# Patient Record
Sex: Male | Born: 1955 | Race: White | Hispanic: No | Marital: Married | Smoking: Former smoker
Health system: Southern US, Community
[De-identification: ages and names within clinical notes are randomized; demographics above are authoritative.]

## PROBLEM LIST (undated history)

## (undated) DIAGNOSIS — J849 Interstitial pulmonary disease, unspecified: Secondary | ICD-10-CM

## (undated) DIAGNOSIS — K5790 Diverticulosis of intestine, part unspecified, without perforation or abscess without bleeding: Secondary | ICD-10-CM

## (undated) DIAGNOSIS — A048 Other specified bacterial intestinal infections: Secondary | ICD-10-CM

## (undated) DIAGNOSIS — C439 Malignant melanoma of skin, unspecified: Secondary | ICD-10-CM

## (undated) DIAGNOSIS — Z86018 Personal history of other benign neoplasm: Secondary | ICD-10-CM

## (undated) DIAGNOSIS — C801 Malignant (primary) neoplasm, unspecified: Secondary | ICD-10-CM

## (undated) HISTORY — DX: Other specified bacterial intestinal infections: A04.8

## (undated) HISTORY — DX: Diverticulosis of intestine, part unspecified, without perforation or abscess without bleeding: K57.90

## (undated) HISTORY — DX: Malignant melanoma of skin, unspecified: C43.9

## (undated) HISTORY — PX: APPENDECTOMY: SHX54

## (undated) HISTORY — PX: KNEE SURGERY: SHX244

## (undated) HISTORY — DX: Malignant (primary) neoplasm, unspecified: C80.1

## (undated) HISTORY — DX: Interstitial pulmonary disease, unspecified: J84.9

---
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
## 2001-01-28 LAB — HM COLONOSCOPY

## 2002-09-14 ENCOUNTER — Encounter: Payer: Self-pay | Admitting: Family Medicine

## 2002-09-14 LAB — CONVERTED CEMR LAB: Blood Glucose, Fasting: 76 mg/dL

## 2004-10-11 ENCOUNTER — Ambulatory Visit: Payer: Self-pay | Admitting: Dermatology

## 2004-11-03 DIAGNOSIS — C801 Malignant (primary) neoplasm, unspecified: Secondary | ICD-10-CM

## 2004-11-03 HISTORY — PX: MELANOMA EXCISION: SHX5266

## 2004-11-03 HISTORY — DX: Malignant (primary) neoplasm, unspecified: C80.1

## 2005-11-19 ENCOUNTER — Ambulatory Visit: Payer: Self-pay | Admitting: Family Medicine

## 2005-11-19 LAB — CONVERTED CEMR LAB
Blood Glucose, Fasting: 99 mg/dL
TSH: 2.26 microintl units/mL

## 2006-03-06 ENCOUNTER — Ambulatory Visit: Payer: Self-pay | Admitting: Dermatology

## 2007-12-17 ENCOUNTER — Encounter: Payer: Self-pay | Admitting: Family Medicine

## 2007-12-17 DIAGNOSIS — E7800 Pure hypercholesterolemia, unspecified: Secondary | ICD-10-CM

## 2007-12-17 DIAGNOSIS — Z85820 Personal history of malignant melanoma of skin: Secondary | ICD-10-CM

## 2007-12-20 ENCOUNTER — Ambulatory Visit: Payer: Self-pay | Admitting: Family Medicine

## 2007-12-20 DIAGNOSIS — K5730 Diverticulosis of large intestine without perforation or abscess without bleeding: Secondary | ICD-10-CM | POA: Insufficient documentation

## 2007-12-20 LAB — CONVERTED CEMR LAB
AST: 24 units/L (ref 0–37)
Albumin: 4 g/dL (ref 3.5–5.2)
Basophils Absolute: 0 10*3/uL (ref 0.0–0.1)
Basophils Relative: 0.1 % (ref 0.0–1.0)
CO2: 30 meq/L (ref 19–32)
Creatinine, Ser: 1 mg/dL (ref 0.4–1.5)
Direct LDL: 152.6 mg/dL
Eosinophils Relative: 2.1 % (ref 0.0–5.0)
GFR calc Af Amer: 101 mL/min
Glucose, Bld: 91 mg/dL (ref 70–99)
HCT: 45.3 % (ref 39.0–52.0)
Hemoglobin: 15.2 g/dL (ref 13.0–17.0)
MCHC: 33.5 g/dL (ref 30.0–36.0)
MCV: 91.4 fL (ref 78.0–100.0)
Monocytes Absolute: 0.5 10*3/uL (ref 0.2–0.7)
Monocytes Relative: 8 % (ref 3.0–11.0)
Neutro Abs: 3.6 10*3/uL (ref 1.4–7.7)
Neutrophils Relative %: 61.1 % (ref 43.0–77.0)
PSA: 3 ng/mL (ref 0.10–4.00)
Potassium: 4.7 meq/L (ref 3.5–5.1)
RDW: 12.2 % (ref 11.5–14.6)
Sodium: 142 meq/L (ref 135–145)
Total Bilirubin: 0.7 mg/dL (ref 0.3–1.2)
Total CHOL/HDL Ratio: 6
Total Protein: 7 g/dL (ref 6.0–8.3)
VLDL: 28 mg/dL (ref 0–40)
WBC: 5.9 10*3/uL (ref 4.5–10.5)

## 2007-12-24 ENCOUNTER — Ambulatory Visit: Payer: Self-pay | Admitting: Family Medicine

## 2007-12-24 DIAGNOSIS — R030 Elevated blood-pressure reading, without diagnosis of hypertension: Secondary | ICD-10-CM

## 2008-02-01 ENCOUNTER — Ambulatory Visit: Payer: Self-pay | Admitting: Family Medicine

## 2008-02-01 LAB — CONVERTED CEMR LAB: OCCULT 3: NEGATIVE

## 2008-02-02 ENCOUNTER — Encounter (INDEPENDENT_AMBULATORY_CARE_PROVIDER_SITE_OTHER): Payer: Self-pay | Admitting: *Deleted

## 2009-02-19 ENCOUNTER — Ambulatory Visit: Payer: Self-pay | Admitting: Family Medicine

## 2009-02-19 LAB — CONVERTED CEMR LAB
ALT: 22 units/L (ref 0–53)
AST: 25 units/L (ref 0–37)
Alkaline Phosphatase: 70 units/L (ref 39–117)
BUN: 15 mg/dL (ref 6–23)
Basophils Relative: 0 % (ref 0.0–3.0)
Bilirubin, Direct: 0 mg/dL (ref 0.0–0.3)
CO2: 28 meq/L (ref 19–32)
Chloride: 107 meq/L (ref 96–112)
Creatinine, Ser: 1 mg/dL (ref 0.4–1.5)
Eosinophils Relative: 2.5 % (ref 0.0–5.0)
GFR calc non Af Amer: 83.34 mL/min (ref >60)
HCT: 43.6 % (ref 39.0–52.0)
HDL: 38.6 mg/dL — ABNORMAL LOW (ref >39.00)
Hemoglobin: 14.9 g/dL (ref 13.0–17.0)
Lymphs Abs: 1.6 10*3/uL (ref 0.7–4.0)
MCV: 93 fL (ref 78.0–100.0)
Monocytes Absolute: 0.5 10*3/uL (ref 0.1–1.0)
Monocytes Relative: 9.1 % (ref 3.0–12.0)
Neutro Abs: 2.9 10*3/uL (ref 1.4–7.7)
Neutrophils Relative %: 57.4 % (ref 43.0–77.0)
Platelets: 223 10*3/uL (ref 150.0–400.0)
Potassium: 4.5 meq/L (ref 3.5–5.1)
TSH: 3.34 microintl units/mL (ref 0.35–5.50)
Total CHOL/HDL Ratio: 6
Total Protein: 6.9 g/dL (ref 6.0–8.3)
Triglycerides: 143 mg/dL (ref 0.0–149.0)
WBC: 5.1 10*3/uL (ref 4.5–10.5)

## 2009-02-21 ENCOUNTER — Ambulatory Visit: Payer: Self-pay | Admitting: Family Medicine

## 2009-02-21 DIAGNOSIS — L84 Corns and callosities: Secondary | ICD-10-CM | POA: Insufficient documentation

## 2009-02-21 DIAGNOSIS — R1310 Dysphagia, unspecified: Secondary | ICD-10-CM

## 2009-03-14 ENCOUNTER — Ambulatory Visit: Payer: Self-pay | Admitting: Family Medicine

## 2009-03-14 ENCOUNTER — Encounter (INDEPENDENT_AMBULATORY_CARE_PROVIDER_SITE_OTHER): Payer: Self-pay | Admitting: *Deleted

## 2009-03-14 LAB — CONVERTED CEMR LAB
OCCULT 1: NEGATIVE
OCCULT 2: NEGATIVE
OCCULT 3: NEGATIVE

## 2010-02-21 ENCOUNTER — Ambulatory Visit: Payer: Self-pay | Admitting: Family Medicine

## 2010-02-21 LAB — CONVERTED CEMR LAB
Albumin: 4 g/dL (ref 3.5–5.2)
Alkaline Phosphatase: 62 units/L (ref 39–117)
BUN: 12 mg/dL (ref 6–23)
Basophils Absolute: 0 10*3/uL (ref 0.0–0.1)
Bilirubin, Direct: 0.1 mg/dL (ref 0.0–0.3)
CO2: 28 meq/L (ref 19–32)
Calcium: 9.2 mg/dL (ref 8.4–10.5)
Cholesterol: 201 mg/dL — ABNORMAL HIGH (ref 0–200)
Creatinine, Ser: 1 mg/dL (ref 0.4–1.5)
Direct LDL: 128.4 mg/dL
Eosinophils Relative: 4.7 % (ref 0.0–5.0)
GFR calc non Af Amer: 83.02 mL/min (ref >60)
Glucose, Bld: 84 mg/dL (ref 70–99)
HCT: 43.4 % (ref 39.0–52.0)
HDL: 36.1 mg/dL — ABNORMAL LOW (ref >39.00)
Hemoglobin: 14.8 g/dL (ref 13.0–17.0)
Lymphocytes Relative: 24.2 % (ref 12.0–46.0)
Monocytes Relative: 12.8 % — ABNORMAL HIGH (ref 3.0–12.0)
Neutro Abs: 2.9 10*3/uL (ref 1.4–7.7)
Platelets: 201 10*3/uL (ref 150.0–400.0)
RBC: 4.7 M/uL (ref 4.22–5.81)
RDW: 13.7 % (ref 11.5–14.6)
Sodium: 139 meq/L (ref 135–145)
TSH: 2.6 microintl units/mL (ref 0.35–5.50)
Total Protein: 6.6 g/dL (ref 6.0–8.3)
Triglycerides: 177 mg/dL — ABNORMAL HIGH (ref 0.0–149.0)
WBC: 5 10*3/uL (ref 4.5–10.5)

## 2010-02-22 LAB — CONVERTED CEMR LAB: Vit D, 25-Hydroxy: 37 ng/mL (ref 30–89)

## 2010-02-26 ENCOUNTER — Ambulatory Visit: Payer: Self-pay | Admitting: Family Medicine

## 2010-03-27 ENCOUNTER — Ambulatory Visit: Payer: Self-pay | Admitting: Family Medicine

## 2010-03-27 ENCOUNTER — Encounter (INDEPENDENT_AMBULATORY_CARE_PROVIDER_SITE_OTHER): Payer: Self-pay | Admitting: *Deleted

## 2010-03-27 LAB — CONVERTED CEMR LAB
OCCULT 1: NEGATIVE
OCCULT 2: NEGATIVE

## 2010-03-27 LAB — FECAL OCCULT BLOOD, GUAIAC: Fecal Occult Blood: NEGATIVE

## 2010-06-11 ENCOUNTER — Encounter (INDEPENDENT_AMBULATORY_CARE_PROVIDER_SITE_OTHER): Payer: Self-pay | Admitting: *Deleted

## 2010-06-27 DIAGNOSIS — Z86018 Personal history of other benign neoplasm: Secondary | ICD-10-CM

## 2010-06-27 HISTORY — DX: Personal history of other benign neoplasm: Z86.018

## 2010-12-03 NOTE — Letter (Signed)
Summary: Results Follow up Letter  [place] [facility]  [address] [place], [place] [address]   Phone: [phone]  Fax: [phone]    03/27/2010 MRN: [id number]  Charles Collins [address] [place], [place]  [address]  Dear [name],  The following are the results of your recent test(s):  Test         Result    Pap Smear:        Normal _____  Not Normal _____ Comments: ______________________________________________________ Cholesterol: LDL(Bad cholesterol):         Your goal is less than:         HDL (Good cholesterol):       Your goal is more than: Comments:  ______________________________________________________ Mammogram:        Normal _____  Not Normal _____ Comments:  ___________________________________________________________________ Hemoccult:        Normal _X____  Not normal _______ Comments:  Please repeat in one year.  _____________________________________________________________________ Other Tests:    We routinely do not discuss normal results over the telephone.  If you desire a copy of the results, or you have any questions about this information we can discuss them at your next office visit.   Sincerely,     Laurita Quint, MD

## 2010-12-03 NOTE — Assessment & Plan Note (Signed)
Summary: CPX/CLE   Vital Signs:  Patient profile:   55 year old male Height:      68 inches Weight:      181.25 pounds BMI:     27.66 Temp:     98.6 degrees F oral Pulse rate:   72 / minute Pulse rhythm:   regular BP sitting:   128 / 86  (left arm) Cuff size:   regular  Vitals Entered By: Sydell Axon LPN (February 26, 2010 8:17 AM) CC: 30 Minute checkup, hemoccult cards given to patient   History of Present Illness: Pt hewre for Comp Exam. He has done well with the swallowing by drinking more.  He has no complaints today except allergies which we discussed.  Preventive Screening-Counseling & Management  Alcohol-Tobacco     Alcohol drinks/day: 0     Smoking Status: never     Passive Smoke Exposure: no  Caffeine-Diet-Exercise     Caffeine use/day: 1     Does Patient Exercise: yes     Type of exercise: treadmill     Exercise (avg: min/session):     Times/week: 3  Problems Prior to Update: 1)  Callus, Right Foot  (ICD-700) 2)  Problems With Swallowing and Mastication  (ICD-V41.6) 3)  Special Screening Malig Neoplasms Other Sites  (ICD-V76.49) 4)  Health Maintenance Exam  (ICD-V70.0) 5)  Special Screening Malignant Neoplasm of Prostate  (ICD-V76.44) 6)  Diverticular Disease  (ICD-562.10) 7)  Melanoma, Hx of  (ICD-V10.82) 8)  Elevated Blood Pressure  (ICD-796.2) 9)  Hypercholesterolemia  (ICD-272.0) 10)  Diverticulitis of Colon  (ICD-562.11)  Medications Prior to Update: 1)  Bayer Aspirin Ec Low Dose 81 Mg  Tbec (Aspirin) .Marland Kitchen.. 1 Daily  Allergies: No Known Drug Allergies  Past History:  Past Surgical History: Last updated: 02/21/2009 APPENDECTOMY  AS TEEN R KNEE OPEN REPAIR AS TEEN [facility]--COLITIS:(01/28/2001) DIVERTICULOSIS-- DESCENDING AND SIGMOID COLON:(03/2001) DIVERTICULOSIS ISCHEMIC COLITIS VIA COLONOSCOPY(DR. SEIGAL):(01/2001) MELANOMA REMOVED RIGHT KNEE (DR Gwen Pounds) 11/2004  Family History: Last updated: 02/26/2010 Father:ALIVE 79;HTN;  DM;OBESITY  Mother: DECEASED AT AGE 66 BRAIN HEMMORHAGE BROTHER A 47 1/2 BROTHER A 40 CV: NEGATIVE HBP: +FATHER; +PGF DM: + FATHER GOUT/ARTHRITIS: PROSTATE CANCER: BREAST/OVARIAN/UTERINE CANCER:NEGATIVE COLON CANCER: NEGATIVE DEPRESSION: NEGATIVE ETOH/DRUG ABUSE: NEGATIVE OTHER: NEGATIVE  STROKE           // MOTHER BRAIN HEMMORHAGE  Social History: Last updated: 12/17/2007 Marital Status: Married LIVES WITH WIFE Children: 2 DAUGHTERS 1 AT HOME Occupation: ELECTRICIAN //HEATING AND AIR CONDITION  Risk Factors: Alcohol Use: 0 (02/26/2010) Caffeine Use: 1 (02/26/2010) Exercise: yes (02/26/2010)  Risk Factors: Smoking Status: never (02/26/2010) Passive Smoke Exposure: no (02/26/2010)  Family History: Father:ALIVE 79;HTN; DM;OBESITY  Mother: DECEASED AT AGE 66 BRAIN HEMMORHAGE BROTHER A 47 1/2 BROTHER A 40 CV: NEGATIVE HBP: +FATHER; +PGF DM: + FATHER GOUT/ARTHRITIS: PROSTATE CANCER: BREAST/OVARIAN/UTERINE CANCER:NEGATIVE COLON CANCER: NEGATIVE DEPRESSION: NEGATIVE ETOH/DRUG ABUSE: NEGATIVE OTHER: NEGATIVE  STROKE           // MOTHER BRAIN HEMMORHAGE  Review of Systems General:  Denies chills, fatigue, fever, sweats, weakness, and weight loss. Eyes:  Denies blurring, discharge, and eye pain. ENT:  Denies decreased hearing, ear discharge, earache, and ringing in ears. CV:  Denies chest pain or discomfort, fainting, fatigue, palpitations, shortness of breath with exertion, swelling of feet, and swelling of hands. Resp:  Denies cough, shortness of breath, and wheezing; Rare wheezing with Spring allergies. GI:  Denies abdominal pain, bloody stools, change in bowel habits, constipation, dark tarry stools,  diarrhea, indigestion, loss of appetite, nausea, vomiting, vomiting blood, and yellowish skin color. GU:  Complains of nocturia; denies discharge, dysuria, and urinary frequency; occas. MS:  Denies joint pain, low back pain, muscle aches, cramps, and stiffness. Derm:   Denies dryness, itching, and rash. Neuro:  Denies numbness, poor balance, tingling, and tremors.  Physical Exam  General:  Well-developed,well-nourished,in no acute distress; alert,appropriate and cooperative throughout examination Head:  Normocephalic and atraumatic without obvious abnormalities. No apparent alopecia or balding. Sinuses NT. Eyes:  Conjunctiva clear bilaterally.  Ears:  External ear exam shows no significant lesions or deformities.  Otoscopic examination reveals clear canals, tympanic membranes are intact bilaterally without bulging, retraction, inflammation or discharge. Hearing is grossly normal bilaterally. Nose:  External nasal examination shows no deformity or inflammation. Nasal mucosa are pink and moist without lesions or exudates. Mouth:  Oral mucosa and oropharynx without lesions or exudates.  Teeth in good repair. Neck:  No deformities, masses, or tenderness noted. Chest Wall:  No deformities, masses, tenderness or gynecomastia noted. Breasts:  No masses or gynecomastia noted Lungs:  Normal respiratory effort, chest expands symmetrically. Lungs are clear to auscultation, no crackles or wheezes. Heart:  Normal rate and regular rhythm. S1 and S2 normal without gallop, murmur, click, rub or other extra sounds. Abdomen:  Bowel sounds positive,abdomen soft and non-tender without masses, organomegaly or hernias noted. Rectal:  No external abnormalities noted. Normal sphincter tone. No rectal masses or tenderness. G neg. Genitalia:  Testes bilaterally descended without nodularity, tenderness or masses. No scrotal masses or lesions. No penis lesions or urethral discharge. Prostate:  Prostate gland firm and smooth, no enlargement, nodularity, tenderness, mass, asymmetry or induration. 10gms. Msk:  No deformity or scoliosis noted of thoracic or lumbar spine.   Pulses:  R and L carotid,radial,femoral,dorsalis pedis and posterior tibial pulses are full and equal  bilaterally Extremities:  No clubbing, cyanosis, edema, or deformity noted with normal full range of motion of all joints.  R 5th plantar surf metatarsal head calloused moderately, pared effectively.. Neurologic:  No cranial nerve deficits noted. Station and gait are normal. Plantar reflexes are down-going bilaterally. DTRs are symmetrical throughout. Sensory, motor and coordinative functions appear intact. Skin:  Intact without suspicious lesions or rashes Cervical Nodes:  No lymphadenopathy noted Inguinal Nodes:  No significant adenopathy Psych:  Cognition and judgment appear intact. Alert and cooperative with normal attention span and concentration. No apparent delusions, illusions, hallucinations   Impression & Recommendations:  Problem # 1:  HEALTH MAINTENANCE EXAM (ICD-V70.0) Assessment Comment Only  Reviewed preventive care protocols, scheduled due services, and updated immunizations.  Problem # 2:  DIVERTICULAR DISEASE (ICD-562.10) Assessment: Unchanged  Discussed avoiding nuts, eating fiber and coming in for prolonged LLQ discomfort.  Colonoscopy:  Labs Reviewed: Hgb: 14.8 (02/21/2010)   Hct: 43.4 (02/21/2010)   WBC: 5.0 (02/21/2010)  Problem # 3:  PROBLEMS WITH SWALLOWING AND MASTICATION (ICD-V41.6) Assessment: Improved Hasmbeen more careful and things improved.  Problem # 4:  SPECIAL SCREENING MALIGNANT NEOPLASM OF PROSTATE (ICD-V76.44) Assessment: Unchanged Stable PSA and exam.  Problem # 5:  MELANOMA, HX OF (ICD-V10.82) Assessment: Unchanged Followed regularly with Dr Gwen Pounds.  Problem # 6:  HYPERCHOLESTEROLEMIA (ICD-272.0) Assessment: Unchanged  Adequate but discussed lowering LDL and Trigs and incr HDL.  Labs Reviewed: SGOT: 23 (02/21/2010)   SGPT: 21 (02/21/2010)   HDL:36.10 (02/21/2010), 38.60 (02/19/2009)  LDL:DEL (12/20/2007)  Chol:201 (02/21/2010), 220 (02/19/2009)  Trig:177.0 (02/21/2010), 143.0 (02/19/2009)  Complete Medication List: 1)  Bayer  Aspirin Ec Low  Dose 81 Mg Tbec (Aspirin) .Marland Kitchen.. 1 daily  Patient Instructions: 1)  RTC one year for Comp Exam, sooner as needed.  Current Allergies (reviewed today): No known allergies

## 2010-12-03 NOTE — Letter (Signed)
Summary: Nadara Eaton letter  [place] [facility]  [address] [place], [place] [address]   Phone: [phone]  Fax: [phone]       06/11/2010 MRN: [id number]  FRANSISCO MESSMER [address] [place], [place]  [address]  Dear Mr. Renee [facility] announce the retirement of Arta Silence, M.D., from full-time practice at the [facility] office effective May 02, 2010 and his plans of returning part-time.  It is important to Dr. Hetty Ely and to our practice that you understand that [facility] has seven physicians in our office for your health care needs.  We will continue to offer the same exceptional care that you have today.    Dr. Hetty Ely has spoken to many of you about his plans for retirement and returning part-time in the fall.   We will continue to work with you through the transition to schedule appointments for you in the office and meet the high standards that [facility] is committed to.   Again, it is with great pleasure that we share the news that Dr. Hetty Ely will return to [facility] in October of 2011 with a reduced schedule.    If you have any questions, or would like to request an appointment with one of our physicians, please call us at [phone] and press the option for Scheduling an appointment.  We take pleasure in providing you with excellent patient care and look forward to seeing you at your next office visit.  Our [facility] Physicians are:  Tillman Abide, M.D. Laurita Quint, M.D. Roxy Manns, M.D. Kerby Nora, M.D. Hannah Beat, M.D. Ruthe Mannan, M.D. We proudly welcomed Raechel Ache, M.D. and Eustaquio Boyden, M.D. to the practice in July/August 2011.  Sincerely,  [facility]

## 2011-01-12 ENCOUNTER — Encounter: Payer: Self-pay | Admitting: Family Medicine

## 2011-01-15 ENCOUNTER — Encounter: Payer: Self-pay | Admitting: Family Medicine

## 2011-02-17 ENCOUNTER — Other Ambulatory Visit: Payer: Self-pay | Admitting: Family Medicine

## 2011-02-17 DIAGNOSIS — K5730 Diverticulosis of large intestine without perforation or abscess without bleeding: Secondary | ICD-10-CM

## 2011-02-17 DIAGNOSIS — Z125 Encounter for screening for malignant neoplasm of prostate: Secondary | ICD-10-CM

## 2011-02-17 DIAGNOSIS — E7800 Pure hypercholesterolemia, unspecified: Secondary | ICD-10-CM

## 2011-02-17 DIAGNOSIS — Z85820 Personal history of malignant melanoma of skin: Secondary | ICD-10-CM

## 2011-02-17 DIAGNOSIS — R1310 Dysphagia, unspecified: Secondary | ICD-10-CM

## 2011-02-17 DIAGNOSIS — R030 Elevated blood-pressure reading, without diagnosis of hypertension: Secondary | ICD-10-CM

## 2011-03-03 ENCOUNTER — Other Ambulatory Visit (INDEPENDENT_AMBULATORY_CARE_PROVIDER_SITE_OTHER): Payer: PRIVATE HEALTH INSURANCE

## 2011-03-03 DIAGNOSIS — R030 Elevated blood-pressure reading, without diagnosis of hypertension: Secondary | ICD-10-CM

## 2011-03-03 DIAGNOSIS — R1310 Dysphagia, unspecified: Secondary | ICD-10-CM

## 2011-03-03 DIAGNOSIS — Z85820 Personal history of malignant melanoma of skin: Secondary | ICD-10-CM

## 2011-03-03 DIAGNOSIS — Z125 Encounter for screening for malignant neoplasm of prostate: Secondary | ICD-10-CM

## 2011-03-03 DIAGNOSIS — E7800 Pure hypercholesterolemia, unspecified: Secondary | ICD-10-CM

## 2011-03-03 LAB — CBC WITH DIFFERENTIAL/PLATELET
Basophils Absolute: 0 10*3/uL (ref 0.0–0.1)
Basophils Relative: 0.5 % (ref 0.0–3.0)
Eosinophils Absolute: 0.1 10*3/uL (ref 0.0–0.7)
Hemoglobin: 14.6 g/dL (ref 13.0–17.0)
MCHC: 34.6 g/dL (ref 30.0–36.0)
MCV: 93.1 fl (ref 78.0–100.0)
Monocytes Absolute: 0.4 10*3/uL (ref 0.1–1.0)
Neutro Abs: 2.9 10*3/uL (ref 1.4–7.7)
RBC: 4.55 Mil/uL (ref 4.22–5.81)
RDW: 13 % (ref 11.5–14.6)

## 2011-03-03 LAB — HEPATIC FUNCTION PANEL
ALT: 21 U/L (ref 0–53)
Albumin: 3.7 g/dL (ref 3.5–5.2)
Bilirubin, Direct: 0 mg/dL (ref 0.0–0.3)
Total Bilirubin: 0.5 mg/dL (ref 0.3–1.2)
Total Protein: 6.4 g/dL (ref 6.0–8.3)

## 2011-03-03 LAB — BASIC METABOLIC PANEL
CO2: 29 mEq/L (ref 19–32)
Calcium: 8.9 mg/dL (ref 8.4–10.5)
Chloride: 102 mEq/L (ref 96–112)
Creatinine, Ser: 0.8 mg/dL (ref 0.4–1.5)
Glucose, Bld: 90 mg/dL (ref 70–99)

## 2011-03-03 LAB — LIPID PANEL
HDL: 41.7 mg/dL (ref >39.00)
Total CHOL/HDL Ratio: 5
Triglycerides: 200 mg/dL — ABNORMAL HIGH (ref 0.0–149.0)

## 2011-03-03 LAB — VITAMIN B12: Vitamin B-12: 245 pg/mL (ref 211–911)

## 2011-03-12 ENCOUNTER — Ambulatory Visit (INDEPENDENT_AMBULATORY_CARE_PROVIDER_SITE_OTHER): Payer: PRIVATE HEALTH INSURANCE | Admitting: Family Medicine

## 2011-03-12 ENCOUNTER — Encounter: Payer: Self-pay | Admitting: Family Medicine

## 2011-03-12 DIAGNOSIS — Z85820 Personal history of malignant melanoma of skin: Secondary | ICD-10-CM

## 2011-03-12 DIAGNOSIS — K5730 Diverticulosis of large intestine without perforation or abscess without bleeding: Secondary | ICD-10-CM

## 2011-03-12 DIAGNOSIS — M722 Plantar fascial fibromatosis: Secondary | ICD-10-CM

## 2011-03-12 DIAGNOSIS — R1310 Dysphagia, unspecified: Secondary | ICD-10-CM

## 2011-03-12 DIAGNOSIS — E7800 Pure hypercholesterolemia, unspecified: Secondary | ICD-10-CM

## 2011-03-12 DIAGNOSIS — R030 Elevated blood-pressure reading, without diagnosis of hypertension: Secondary | ICD-10-CM

## 2011-03-12 NOTE — Assessment & Plan Note (Signed)
Normal today. BP Readings from Last 3 Encounters:  03/12/11 112/72  02/26/10 128/86  02/21/09 130/80

## 2011-03-12 NOTE — Patient Instructions (Signed)
RTC one year fpr recheck, Td UTD. Given iFOB.

## 2011-03-12 NOTE — Assessment & Plan Note (Signed)
Discussed returning for prolonged LLQ discomfort.

## 2011-03-12 NOTE — Progress Notes (Signed)
  Subjective:    Patient ID: Charles Collins, male    DOB: Mar 18, 1956, 55 y.o.   MRN: [id number]  HPI Pt here for Comp Exam. He feels well and has no complaints. He has had plantar fasciitis.Marland KitchenMarland Kitchenput on Meloxicam and insoles and then given Meloxicam. He has taken the Meloxicam 3 months now. He has one more refill for a month left and then we discussed avoiding as able. He is doing stretching.   Review of Systems  Constitutional: Negative for fever, chills, diaphoresis, appetite change, fatigue and unexpected weight change.  HENT: Negative for hearing loss, ear pain, tinnitus and ear discharge.   Eyes: Negative for pain, discharge and visual disturbance.  Respiratory: Negative for cough, shortness of breath and wheezing.   Cardiovascular: Negative for chest pain and palpitations.       No SOB w/ exertion  Gastrointestinal: Negative for nausea, vomiting, abdominal pain, diarrhea, constipation and blood in stool.       No heartburn or swallowing problems.  Genitourinary: Negative for dysuria, frequency and difficulty urinating.       No nocturia  Musculoskeletal: Negative for myalgias, back pain and arthralgias.       Foot pain per HPI.  Skin: Negative for rash.       No itching or dryness.  Neurological: Negative for tremors and numbness.       No tingling or balance problems.  Hematological: Negative for adenopathy. Does not bruise/bleed easily.  Psychiatric/Behavioral: Negative for dysphoric mood and agitation.       Objective:   Physical Exam  Constitutional: He is oriented to person, place, and time. He appears well-developed and well-nourished. No distress.  HENT:  Head: Normocephalic and atraumatic.  Right Ear: External ear normal.  Left Ear: External ear normal.  Nose: Nose normal.  Mouth/Throat: Oropharynx is clear and moist.  Eyes: Conjunctivae and EOM are normal. Pupils are equal, round, and reactive to light. Right eye exhibits no discharge. Left eye exhibits no  discharge. No scleral icterus.  Neck: Normal range of motion. Neck supple. No thyromegaly present.  Cardiovascular: Normal rate, regular rhythm, normal heart sounds and intact distal pulses.   No murmur heard. Pulmonary/Chest: Effort normal and breath sounds normal. No respiratory distress. He has no wheezes.  Abdominal: Soft. Bowel sounds are normal. He exhibits no distension and no mass. There is no tenderness. There is no rebound and no guarding.  Genitourinary: Rectum normal, prostate normal and penis normal. Guaiac negative stool.       Prostate 10-20gms, smooth and firm.  Musculoskeletal: Normal range of motion. He exhibits no edema.  Lymphadenopathy:    He has no cervical adenopathy.  Neurological: He is alert and oriented to person, place, and time. Coordination normal.  Skin: Skin is warm and dry. No rash noted. He is not diaphoretic.  Psychiatric: He has a normal mood and affect. His behavior is normal. Judgment and thought content normal.          Assessment & Plan:  HMPE

## 2011-03-12 NOTE — Assessment & Plan Note (Signed)
Per podiatry, finish another month of Meloxicam and then stop med but cont stretches, inserts and soaks.

## 2011-03-12 NOTE — Assessment & Plan Note (Signed)
No problems lately. Chews carefully.

## 2011-03-12 NOTE — Assessment & Plan Note (Addendum)
Tirgs slightly elevated. LDL very mildly elevated for him. Discussed diet and exercise. Lab Results  Component Value Date   CHOL 219* 03/03/2011   CHOL 201* 02/21/2010   CHOL 220* 02/19/2009   Lab Results  Component Value Date   HDL 41.70 03/03/2011   HDL 16.10* 02/21/2010   HDL 38.60* 02/19/2009   No results found for this basename: LDLCALC   Lab Results  Component Value Date   TRIG 200.0* 03/03/2011   TRIG 177.0* 02/21/2010   TRIG 143.0 02/19/2009   Lab Results  Component Value Date   CHOLHDL 5 03/03/2011   CHOLHDL 6 02/21/2010   CHOLHDL 6 02/19/2009   Lab Results  Component Value Date   LDLDIRECT 136.1 03/03/2011   LDLDIRECT 128.4 02/21/2010   LDLDIRECT 141.5 02/19/2009

## 2011-03-12 NOTE — Assessment & Plan Note (Signed)
Continues to see Dr Gwen Pounds.

## 2011-03-26 ENCOUNTER — Other Ambulatory Visit: Payer: PRIVATE HEALTH INSURANCE

## 2011-03-26 ENCOUNTER — Other Ambulatory Visit (INDEPENDENT_AMBULATORY_CARE_PROVIDER_SITE_OTHER): Payer: PRIVATE HEALTH INSURANCE | Admitting: Family Medicine

## 2011-03-26 DIAGNOSIS — Z1211 Encounter for screening for malignant neoplasm of colon: Secondary | ICD-10-CM

## 2011-03-26 LAB — FECAL OCCULT BLOOD, IMMUNOCHEMICAL: Fecal Occult Bld: NEGATIVE

## 2011-03-27 ENCOUNTER — Encounter: Payer: Self-pay | Admitting: *Deleted

## 2012-03-10 ENCOUNTER — Other Ambulatory Visit: Payer: Self-pay | Admitting: Family Medicine

## 2012-03-10 DIAGNOSIS — E7800 Pure hypercholesterolemia, unspecified: Secondary | ICD-10-CM

## 2012-03-10 DIAGNOSIS — Z125 Encounter for screening for malignant neoplasm of prostate: Secondary | ICD-10-CM

## 2012-03-15 ENCOUNTER — Other Ambulatory Visit (INDEPENDENT_AMBULATORY_CARE_PROVIDER_SITE_OTHER): Payer: BC Managed Care – PPO

## 2012-03-15 DIAGNOSIS — Z125 Encounter for screening for malignant neoplasm of prostate: Secondary | ICD-10-CM

## 2012-03-15 DIAGNOSIS — E7800 Pure hypercholesterolemia, unspecified: Secondary | ICD-10-CM

## 2012-03-15 DIAGNOSIS — E785 Hyperlipidemia, unspecified: Secondary | ICD-10-CM

## 2012-03-15 LAB — LIPID PANEL
Cholesterol: 211 mg/dL — ABNORMAL HIGH (ref 0–200)
HDL: 40.2 mg/dL (ref >39.00)
Total CHOL/HDL Ratio: 5
Triglycerides: 201 mg/dL — ABNORMAL HIGH (ref 0.0–149.0)

## 2012-03-15 LAB — COMPREHENSIVE METABOLIC PANEL
ALT: 21 U/L (ref 0–53)
Alkaline Phosphatase: 62 U/L (ref 39–117)
Chloride: 102 mEq/L (ref 96–112)
Creatinine, Ser: 0.9 mg/dL (ref 0.4–1.5)
GFR: 95.47 mL/min (ref >60.00)
Glucose, Bld: 90 mg/dL (ref 70–99)
Sodium: 139 mEq/L (ref 135–145)
Total Bilirubin: 0.4 mg/dL (ref 0.3–1.2)
Total Protein: 6.4 g/dL (ref 6.0–8.3)

## 2012-03-15 LAB — PSA: PSA: 0.9 ng/mL (ref 0.10–4.00)

## 2012-03-15 LAB — LDL CHOLESTEROL, DIRECT: Direct LDL: 131.4 mg/dL

## 2012-03-18 ENCOUNTER — Encounter: Payer: Self-pay | Admitting: Family Medicine

## 2012-03-18 ENCOUNTER — Ambulatory Visit (INDEPENDENT_AMBULATORY_CARE_PROVIDER_SITE_OTHER): Payer: BC Managed Care – PPO | Admitting: Family Medicine

## 2012-03-18 ENCOUNTER — Encounter: Payer: PRIVATE HEALTH INSURANCE | Admitting: Family Medicine

## 2012-03-18 VITALS — BP 116/82 | HR 71 | Temp 98.3°F | Wt 185.0 lb

## 2012-03-18 DIAGNOSIS — Z23 Encounter for immunization: Secondary | ICD-10-CM

## 2012-03-18 DIAGNOSIS — Z0000 Encounter for general adult medical examination without abnormal findings: Secondary | ICD-10-CM | POA: Insufficient documentation

## 2012-03-18 DIAGNOSIS — Z1211 Encounter for screening for malignant neoplasm of colon: Secondary | ICD-10-CM

## 2012-03-18 NOTE — Patient Instructions (Addendum)
I would get a flu shot each fall.   Go to the lab on the way out.  We'll contact you with your lab report. Keep exercising.  Recheck in 1 year.   Glad to see you.

## 2012-03-18 NOTE — Assessment & Plan Note (Signed)
Routine anticipatory guidance given to patient.  See health maintenance. Exercise mainly at work Diet discussed, he's working on low salt diet.  Colonoscopy 2002. D/w patient [id number] for colon cancer screening, including IFOB vs. colonoscopy.  Risks and benefits of both were discussed and patient voiced understanding.  Pt elects for: IFOB.   PSA options were discussed along with recent recs. Patient is low risk and there is no FH of prosate CA.  PSA wnl. Td 2003.  Flu shot discussed.   Advance directive.  He has a living will.  Wife is HCPOA.

## 2012-03-18 NOTE — Progress Notes (Signed)
CPE- See plan.  Routine anticipatory guidance given to patient.  See health maintenance. Exercise mainly at work Diet discussed, he's working on low salt diet.  Colonoscopy 2002. D/w patient [id number] for colon cancer screening, including IFOB vs. colonoscopy.  Risks and benefits of both were discussed and patient voiced understanding.  Pt elects for: IFOB.   PSA options were discussed along with recent recs. Patient is low risk and there is no FH of prosate CA.  PSA wnl. Td 2003.  Flu shot discussed.   Advance directive.  He has a living will.  Wife is HCPOA.    PMH and SH reviewed  Meds, vitals, and allergies reviewed.   ROS: See HPI.  Otherwise negative.    GEN: nad, alert and oriented HEENT: mucous membranes moist NECK: supple w/o LA CV: rrr. PULM: ctab, no inc wob ABD: soft, +bs EXT: no edema SKIN: no acute rash, scar on R knee noted Prostate gland firm and smooth, no enlargement, nodularity, tenderness, mass, asymmetry or induration.

## 2012-04-06 ENCOUNTER — Other Ambulatory Visit: Payer: BC Managed Care – PPO

## 2012-04-06 ENCOUNTER — Encounter: Payer: Self-pay | Admitting: *Deleted

## 2012-04-06 DIAGNOSIS — Z1211 Encounter for screening for malignant neoplasm of colon: Secondary | ICD-10-CM

## 2012-04-06 LAB — FECAL OCCULT BLOOD, IMMUNOCHEMICAL: Fecal Occult Bld: NEGATIVE

## 2013-01-05 ENCOUNTER — Encounter: Payer: Self-pay | Admitting: Family Medicine

## 2013-01-05 ENCOUNTER — Ambulatory Visit (INDEPENDENT_AMBULATORY_CARE_PROVIDER_SITE_OTHER): Payer: BC Managed Care – PPO | Admitting: Family Medicine

## 2013-01-05 VITALS — BP 124/84 | HR 78 | Temp 99.4°F | Wt 188.8 lb

## 2013-01-05 DIAGNOSIS — R1310 Dysphagia, unspecified: Secondary | ICD-10-CM

## 2013-01-05 DIAGNOSIS — R0683 Snoring: Secondary | ICD-10-CM

## 2013-01-05 DIAGNOSIS — R0609 Other forms of dyspnea: Secondary | ICD-10-CM

## 2013-01-05 MED ORDER — RANITIDINE HCL 150 MG PO TABS: 75.0000 mg | ORAL_TABLET | Freq: Two times a day (BID) | ORAL | Status: DC

## 2013-01-05 NOTE — Assessment & Plan Note (Signed)
Possible esophageal spasm.  Unlikely to be a static ring given the episodic sx.  No red flag sx. Will try H2 blocker as he didn't tolerate PPI. Call back and set up GI eval if needed. He agrees.

## 2013-01-05 NOTE — Assessment & Plan Note (Signed)
With a small jaw but not a large tongue or other obvious cause. Neck is only 16".  Not overweight.  Would have him check with dental clinic and see about an appliance.  If not feasible, then refer to pulm for OSA eval. He agrees.

## 2013-01-05 NOTE — Patient Instructions (Signed)
Try to get a dental guard from the dental clinic.  If that doesn't help, let me know and we can set you up with pulmonary.   Try zantac (ranitidine) 75-150 up to twice a day.  If you aren't improved, then let me know.  We may need to get you to see GI.

## 2013-01-05 NOTE — Progress Notes (Signed)
Last 6 months of increased snoring.  Wife has noted it, keeping her awake and he had to sleep in a different room.  Waking tired.  No AM headache.  Fatigued.  Not napping.  No driving incidents.  He's not sleeping as well as prev.  He occ wakes himself but not gasping for air.  No apneas noted by wife.  He's tried humidifier and nasal strips w/o help.  He doesn't feel stuffy.  He's tried mult pillows.  Nonsmoker.  No etoh.  Neck is ~16.5".  Weight is stable.    He's had mult episodes of feeling like food is sticking as he is swallowing.  He'll have to vomit to clear some phlegm (but not food) and then can eat w/o complication. This is long standing, happens a few times in a 6 month period.  occ heartburn. He's tried prilosec but didn't tolerate it.  No blood in vomit, no blood in stool.    Meds, vitals, and allergies reviewed.   ROS: See HPI.  Otherwise, noncontributory.  GEN: nad, alert and oriented HEENT: mucous membranes moist NECK: supple w/o LA CV: rrr PULM: ctab, no inc wob

## 2013-01-17 ENCOUNTER — Encounter: Payer: Self-pay | Admitting: Family Medicine

## 2013-01-17 ENCOUNTER — Other Ambulatory Visit: Payer: Self-pay | Admitting: Family Medicine

## 2013-01-17 DIAGNOSIS — R0683 Snoring: Secondary | ICD-10-CM

## 2013-02-08 ENCOUNTER — Ambulatory Visit (INDEPENDENT_AMBULATORY_CARE_PROVIDER_SITE_OTHER): Payer: BC Managed Care – PPO | Admitting: Pulmonary Disease

## 2013-02-08 ENCOUNTER — Telehealth: Payer: Self-pay | Admitting: Pulmonary Disease

## 2013-02-08 ENCOUNTER — Encounter: Payer: Self-pay | Admitting: Pulmonary Disease

## 2013-02-08 ENCOUNTER — Ambulatory Visit (HOSPITAL_BASED_OUTPATIENT_CLINIC_OR_DEPARTMENT_OTHER): Payer: BC Managed Care – PPO

## 2013-02-08 VITALS — BP 122/78 | HR 60 | Temp 98.0°F | Ht 68.0 in | Wt 188.0 lb

## 2013-02-08 DIAGNOSIS — R0609 Other forms of dyspnea: Secondary | ICD-10-CM

## 2013-02-08 DIAGNOSIS — R0683 Snoring: Secondary | ICD-10-CM

## 2013-02-08 NOTE — Progress Notes (Signed)
Chief Complaint  Patient presents with  . Advice Only    Pt c/o excessive snoring per spouse. Has not been told he stops breathing at night. Pt states he feels rested during the day for the most part. Has been using nasal strips and mouth gaurd at night and it seems to help.    History of Present Illness: Charles Collins is a 57 y.o. male for evaluation of sleep problems.  He has noticed trouble with snoring.  This has become more of a concern over the past year.  This happens more on his back.  He is not sure if he stops breathing while asleep.  He goes to sleep at 11 pm.  He falls asleep quickly.  He wakes up 2 times to use the bathroom.  He gets out of bed at 530 am.  He feels sometimes feels tired in the morning.  He denies morning headache.  He does not use anything to help him fall sleep or stay awake.  He denies sleep walking, sleep talking, bruxism, or nightmares.  There is no history of restless legs.  He denies sleep hallucinations, sleep paralysis, or cataplexy.  The Epworth score is 7 out of 24.  He has been using nasal strips and and OTC mouth guard, and these have helped some.  He has also been using allergy medicines for his sinuses, and feels this has helped.  He still does have snoring.  He has not been evaluated by his dentist since using the mouth guard, but his wife works for an [employer] in [place] and he checked there to determine if mouth guard was okay to use.  Tests:   ALAMIN MCCUISTON  has a past medical history of Cancer (11/2004) and Diverticulosis.  Deveion Denz Beichner  has past surgical history that includes Appendectomy and Knee surgery.  Prior to Admission medications   Medication Sig Start Date End Date Taking? Authorizing Provider  aspirin (BAYER CHILDRENS ASPIRIN) 81 MG chewable tablet Chew 81 mg by mouth daily.     Yes Historical Provider, MD  Multiple Vitamin (MULTIVITAMIN) tablet Take 1 tablet by mouth daily.   Yes Historical Provider, MD   ranitidine (ZANTAC) 150 MG tablet Take 75-150 mg by mouth daily. 01/05/13  Yes Joaquim Nam, MD    Allergies  Allergen Reactions  . Prilosec (Omeprazole)     Not an allergy, but "didn't feel well taking it"     His family history includes Diabetes in his father; Hypertension in his father and paternal grandfather; Obesity in his father; and Stroke (age of onset: 21) in his mother.  There is no history of Heart disease, and Cancer, and Depression, and Drug abuse, and Alcohol abuse, and Prostate cancer, and Colon cancer, .  He  reports that he quit smoking about 44 years ago. He has never used smokeless tobacco. He reports that he does not drink alcohol or use illicit drugs.  Review of Systems  Constitutional: Negative for appetite change.  HENT: Positive for trouble swallowing. Negative for ear pain, congestion, sore throat, sneezing and dental problem.   Respiratory: Negative for cough and shortness of breath.   Cardiovascular: Negative for chest pain, palpitations and leg swelling.  Gastrointestinal: Negative for abdominal pain.  Musculoskeletal: Negative for joint swelling.  Skin: Negative for rash.  Neurological: Negative for headaches.  Psychiatric/Behavioral: Negative for dysphoric mood. The patient is not nervous/anxious.    Physical Exam:  General - No distress ENT - No sinus tenderness, MP  2, decreased AP diameter, high arched palate, retrognathic, micrognathic, no oral exudate, no LAN, no thyromegaly, TM clear, pupils equal/reactive Cardiac - s1s2 regular, no murmur, pulses symmetric Chest - No wheeze/rales/dullness, good air entry, normal respiratory excursion Back - No focal tenderness Abd - Soft, non-tender, no organomegaly, + bowel sounds Ext - No edema Neuro - Normal strength, cranial nerves intact Skin - No rashes Psych - Normal mood, and behavior

## 2013-02-08 NOTE — Progress Notes (Deleted)
  Subjective:    Patient ID: Charles Collins, male    DOB: 1956/04/17, 57 y.o.   MRN: [id number]  HPI    Review of Systems  Constitutional: Negative for appetite change.  HENT: Positive for trouble swallowing. Negative for ear pain, congestion, sore throat, sneezing and dental problem.   Respiratory: Negative for cough and shortness of breath.   Cardiovascular: Negative for chest pain, palpitations and leg swelling.  Gastrointestinal: Negative for abdominal pain.  Musculoskeletal: Negative for joint swelling.  Skin: Negative for rash.  Neurological: Negative for headaches.  Psychiatric/Behavioral: Negative for dysphoric mood. The patient is not nervous/anxious.        Objective:   Physical Exam        Assessment & Plan:

## 2013-02-08 NOTE — Patient Instructions (Signed)
Will arrange for sleep study Will call to arrange for follow up after sleep study reviewed 

## 2013-02-08 NOTE — Assessment & Plan Note (Addendum)
He reports snoring, sleep disruption, and daytime sleepiness.  He could have sleep apnea.  We discussed how sleep apnea can affect various health problems including risks for hypertension, cardiovascular disease, and diabetes.  We also discussed how sleep disruption can increase risks for accident, such as while driving.  Weight loss as a means of improving sleep apnea was also reviewed.  Additional treatment options discussed were CPAP therapy, oral appliance, and surgical intervention.  To further assess will arrange for in lab sleep study.  Advised him to d/w his dentist about whether he could continue using mouth guard, or if he would be a candidate for oral appliance if he is found to have sleep apnea.  Explained that OTC mouth guards can help primary snoring, but are not approved to treat sleep apnea.

## 2013-02-08 NOTE — Telephone Encounter (Signed)
Spoke to pt his bcbs does not required precert however he has a $2000 deductable which he has not met so he does not want to do npsg right now FYI!

## 2013-02-10 NOTE — Telephone Encounter (Signed)
Noted.  This is unfortunate.  Can you check if pt is willing to have home sleep study (likely would be less expensive), and if this will be approved by his insurance company.

## 2013-03-06 ENCOUNTER — Other Ambulatory Visit: Payer: Self-pay | Admitting: Family Medicine

## 2013-03-06 DIAGNOSIS — E7800 Pure hypercholesterolemia, unspecified: Secondary | ICD-10-CM

## 2013-03-16 ENCOUNTER — Other Ambulatory Visit (INDEPENDENT_AMBULATORY_CARE_PROVIDER_SITE_OTHER): Payer: BC Managed Care – PPO

## 2013-03-16 DIAGNOSIS — R030 Elevated blood-pressure reading, without diagnosis of hypertension: Secondary | ICD-10-CM

## 2013-03-16 DIAGNOSIS — Z0000 Encounter for general adult medical examination without abnormal findings: Secondary | ICD-10-CM

## 2013-03-16 DIAGNOSIS — E7800 Pure hypercholesterolemia, unspecified: Secondary | ICD-10-CM

## 2013-03-16 LAB — COMPREHENSIVE METABOLIC PANEL
ALT: 48 U/L (ref 0–53)
AST: 27 U/L (ref 0–37)
Albumin: 3.9 g/dL (ref 3.5–5.2)
Alkaline Phosphatase: 76 U/L (ref 39–117)
CO2: 27 mEq/L (ref 19–32)
Chloride: 106 mEq/L (ref 96–112)
GFR: 77.59 mL/min (ref >60.00)
Sodium: 138 mEq/L (ref 135–145)
Total Bilirubin: 0.6 mg/dL (ref 0.3–1.2)
Total Protein: 6.9 g/dL (ref 6.0–8.3)

## 2013-03-16 LAB — LIPID PANEL
Total CHOL/HDL Ratio: 5
VLDL: 28.6 mg/dL (ref 0.0–40.0)

## 2013-03-16 LAB — LDL CHOLESTEROL, DIRECT: Direct LDL: 136.5 mg/dL

## 2013-03-22 ENCOUNTER — Ambulatory Visit (INDEPENDENT_AMBULATORY_CARE_PROVIDER_SITE_OTHER): Payer: BC Managed Care – PPO | Admitting: Family Medicine

## 2013-03-22 ENCOUNTER — Encounter: Payer: Self-pay | Admitting: Family Medicine

## 2013-03-22 VITALS — BP 104/64 | HR 84 | Temp 98.4°F | Ht 68.0 in | Wt 183.0 lb

## 2013-03-22 DIAGNOSIS — Z0000 Encounter for general adult medical examination without abnormal findings: Secondary | ICD-10-CM

## 2013-03-22 DIAGNOSIS — Z1211 Encounter for screening for malignant neoplasm of colon: Secondary | ICD-10-CM

## 2013-03-22 DIAGNOSIS — R1310 Dysphagia, unspecified: Secondary | ICD-10-CM

## 2013-03-22 NOTE — Progress Notes (Signed)
CPE- See plan.  Routine anticipatory guidance given to patient.  See health maintenance. Tetanus 2013 Flu shot d/w pt.  Encouraged.   D/w patient [id number] for colon cancer screening, including IFOB vs. colonoscopy.  Risks and benefits of both were discussed and patient voiced understanding.  Pt elects for: IFOB.   Prostate cancer screening and PSA options (with potential risks and benefits of testing vs not testing) were discussed along with recent recs/guidelines.  He declined testing PSA at this point.  We can consider rechecking next year.   Living will d/w pt. He has one.  Would have his wife designated if incapacitated.  Diet and exercise d/w pt.  He's working on both.   He had f/u with dermatology in 2013 for melanoma hx.  He's going once a year now.  No new lesions.   H/o possible esophageal spasm, with sx controlled with zantac prn.  Doing better overall.   PMH and SH reviewed  Meds, vitals, and allergies reviewed.   ROS: See HPI.  Otherwise negative.    GEN: nad, alert and oriented HEENT: mucous membranes moist NECK: supple w/o LA CV: rrr. PULM: ctab, no inc wob ABD: soft, +bs EXT: no edema SKIN: no acute rash

## 2013-03-22 NOTE — Patient Instructions (Addendum)
Go to the lab on the way out.  We'll contact you with your lab report. I would get a flu shot each fall.   Remind me if you want me to check your PSA next year. You can drop off a copy of your living will.   Take care.  Glad to see you.

## 2013-03-23 NOTE — Assessment & Plan Note (Signed)
Improved with prn H2 blocker. Continue.

## 2013-03-23 NOTE — Assessment & Plan Note (Signed)
Routine anticipatory guidance given to patient.  See health maintenance. Tetanus 2013 Flu shot d/w pt.  Encouraged.   D/w patient [id number] for colon cancer screening, including IFOB vs. colonoscopy.  Risks and benefits of both were discussed and patient voiced understanding.  Pt elects for: IFOB.   Prostate cancer screening and PSA options (with potential risks and benefits of testing vs not testing) were discussed along with recent recs/guidelines.  He declined testing PSA at this point.  We can consider rechecking next year.   Living will d/w pt. He has one.  Would have his wife designated if incapacitated.  Diet and exercise d/w pt.  He's working on both.

## 2013-03-29 ENCOUNTER — Other Ambulatory Visit (INDEPENDENT_AMBULATORY_CARE_PROVIDER_SITE_OTHER): Payer: BC Managed Care – PPO

## 2013-03-29 DIAGNOSIS — Z1211 Encounter for screening for malignant neoplasm of colon: Secondary | ICD-10-CM

## 2013-03-29 LAB — FECAL OCCULT BLOOD, IMMUNOCHEMICAL: Fecal Occult Bld: NEGATIVE

## 2013-03-30 ENCOUNTER — Encounter: Payer: Self-pay | Admitting: *Deleted

## 2013-09-08 ENCOUNTER — Other Ambulatory Visit: Payer: Self-pay

## 2014-03-09 ENCOUNTER — Other Ambulatory Visit: Payer: Self-pay | Admitting: Family Medicine

## 2014-03-09 DIAGNOSIS — E7800 Pure hypercholesterolemia, unspecified: Secondary | ICD-10-CM

## 2014-03-16 ENCOUNTER — Other Ambulatory Visit (INDEPENDENT_AMBULATORY_CARE_PROVIDER_SITE_OTHER): Payer: BC Managed Care – PPO

## 2014-03-16 DIAGNOSIS — E7800 Pure hypercholesterolemia, unspecified: Secondary | ICD-10-CM

## 2014-03-16 LAB — COMPREHENSIVE METABOLIC PANEL
ALK PHOS: 65 U/L (ref 39–117)
ALT: 29 U/L (ref 0–53)
AST: 28 U/L (ref 0–37)
Albumin: 4 g/dL (ref 3.5–5.2)
BILIRUBIN TOTAL: 0.6 mg/dL (ref 0.2–1.2)
BUN: 12 mg/dL (ref 6–23)
CO2: 30 meq/L (ref 19–32)
CREATININE: 1 mg/dL (ref 0.4–1.5)
Calcium: 9.3 mg/dL (ref 8.4–10.5)
Chloride: 104 mEq/L (ref 96–112)
GFR: 81.79 mL/min (ref >60.00)
GLUCOSE: 81 mg/dL (ref 70–99)
Potassium: 4.5 mEq/L (ref 3.5–5.1)
Sodium: 138 mEq/L (ref 135–145)
TOTAL PROTEIN: 7 g/dL (ref 6.0–8.3)

## 2014-03-16 LAB — LIPID PANEL
CHOLESTEROL: 227 mg/dL — AB (ref 0–200)
HDL: 42.4 mg/dL (ref >39.00)
LDL CALC: 153 mg/dL — AB (ref 0–99)
TRIGLYCERIDES: 159 mg/dL — AB (ref 0.0–149.0)
Total CHOL/HDL Ratio: 5
VLDL: 31.8 mg/dL (ref 0.0–40.0)

## 2014-03-23 ENCOUNTER — Encounter: Payer: Self-pay | Admitting: Family Medicine

## 2014-03-23 ENCOUNTER — Ambulatory Visit (INDEPENDENT_AMBULATORY_CARE_PROVIDER_SITE_OTHER): Payer: BC Managed Care – PPO | Admitting: Family Medicine

## 2014-03-23 VITALS — BP 108/72 | HR 69 | Temp 97.9°F | Ht 68.0 in | Wt 180.5 lb

## 2014-03-23 DIAGNOSIS — E7800 Pure hypercholesterolemia, unspecified: Secondary | ICD-10-CM

## 2014-03-23 DIAGNOSIS — Z1211 Encounter for screening for malignant neoplasm of colon: Secondary | ICD-10-CM

## 2014-03-23 DIAGNOSIS — Z0000 Encounter for general adult medical examination without abnormal findings: Secondary | ICD-10-CM

## 2014-03-23 NOTE — Assessment & Plan Note (Signed)
Routine anticipatory guidance given to patient.  See health maintenance. Tetanus 2013 Flu shot encouraged.   Prostate cancer screening and PSA options (with potential risks and benefits of testing vs not testing) were discussed along with recent recs/guidelines.  He declined testing PSA at this point. D/w patient [id number] for colon cancer screening, including IFOB vs. colonoscopy.  Risks and benefits of both were discussed and patient voiced understanding.  Pt elects BZM:CEYE.  Diet and exercise d/w pt.  Doing well with both.   Living will d/w pt.  Wife designated if patient were incapacitated.

## 2014-03-23 NOTE — Assessment & Plan Note (Signed)
He's likely not to the point of needing meds at this point. D/w pt about diet and exercise.  Can recheck periodically.

## 2014-03-23 NOTE — Patient Instructions (Signed)
Go to the lab on the way out.  We'll contact you with your lab report. Take care.  Glad to see you.  

## 2014-03-23 NOTE — Progress Notes (Signed)
Pre visit review using our clinic review tool, if applicable. No additional management support is needed unless otherwise documented below in the visit note.  CPE- See plan.  Routine anticipatory guidance given to patient.  See health maintenance. Tetanus 2013 Flu shot encouraged.   Prostate cancer screening and PSA options (with potential risks and benefits of testing vs not testing) were discussed along with recent recs/guidelines.  He declined testing PSA at this point. D/w patient [id number] for colon cancer screening, including IFOB vs. colonoscopy.  Risks and benefits of both were discussed and patient voiced understanding.  Pt elects [id number].  Diet and exercise d/w pt.  Doing well with both.   Living will d/w pt.  Wife designated if patient were incapacitated.    LDL up.  D/w pt- he's likely not to the point of needing meds at this point.  D/w pt about diet and exercise.   Work is going well.    PMH and SH reviewed  Meds, vitals, and allergies reviewed.   ROS: See HPI.  Otherwise negative.    GEN: nad, alert and oriented HEENT: mucous membranes moist NECK: supple w/o LA CV: rrr. PULM: ctab, no inc wob ABD: soft, +bs EXT: no edema SKIN: no acute rash

## 2014-04-03 ENCOUNTER — Other Ambulatory Visit (INDEPENDENT_AMBULATORY_CARE_PROVIDER_SITE_OTHER): Payer: BC Managed Care – PPO

## 2014-04-03 DIAGNOSIS — Z1211 Encounter for screening for malignant neoplasm of colon: Secondary | ICD-10-CM

## 2014-04-03 LAB — FECAL OCCULT BLOOD, IMMUNOCHEMICAL: Fecal Occult Bld: NEGATIVE

## 2014-04-04 ENCOUNTER — Encounter: Payer: Self-pay | Admitting: *Deleted

## 2014-11-06 ENCOUNTER — Encounter: Payer: Self-pay | Admitting: Family Medicine

## 2014-11-06 ENCOUNTER — Ambulatory Visit (INDEPENDENT_AMBULATORY_CARE_PROVIDER_SITE_OTHER): Payer: BLUE CROSS/BLUE SHIELD | Admitting: Family Medicine

## 2014-11-06 VITALS — BP 118/74 | HR 68 | Temp 97.7°F | Wt 184.2 lb

## 2014-11-06 DIAGNOSIS — M545 Low back pain, unspecified: Secondary | ICD-10-CM | POA: Insufficient documentation

## 2014-11-06 NOTE — Progress Notes (Signed)
Pre visit review using our clinic review tool, if applicable. No additional management support is needed unless otherwise documented below in the visit note.  L hip vs lower back/buttock pain.  Worse with prolonged sitting and then getting up.  Noted at the end of the day after working hard.  Sharp pain.  Some pain getting in/out of the truck.  No R sided sx.  No knee pain.  No midline back pain.  No FCNAVD.  No dysuria.  No weakness.  No numbness.   Intermittent pain.  Going on for about 6 months, not getting worse.  No trauma to cause it.  Nothing in his back pockets at baseline.   H/o chiropractor use in the past but not for this.   Ibuprofen helps a little.  Some stretching in the AM, mainly his calf muscles B, not much hamstring stretching.   occ radicular L leg pain, but not always.    Meds, vitals, and allergies reviewed.   ROS: See HPI.  Otherwise, noncontributory.  nad rrr Back w/o midline pain No rash, no bruising abd soft Slightly ttp near L SI joint, not ttp on R side Tight hamstrings B SLR neg B Pos left SI testing.  S/S/DTR wnl for the BLE No pain on int rotation L hip.

## 2014-11-06 NOTE — Patient Instructions (Signed)
Take OTC ibuprofen with food, try heat and ice, and use the stretches.  This should get better- notify me if no improvement. Take care.

## 2014-11-06 NOTE — Assessment & Plan Note (Signed)
Could have some occ sciatica pain, but the main issue seems to be at the L SI joint.   D/w pt re: OTC ibuprofen with GI caution, ice/heat, stretching.  Handout given, d/w pt.   He understood.  F/u prn.  No need to image.

## 2015-03-18 ENCOUNTER — Other Ambulatory Visit: Payer: Self-pay | Admitting: Family Medicine

## 2015-03-18 DIAGNOSIS — E7800 Pure hypercholesterolemia, unspecified: Secondary | ICD-10-CM

## 2015-03-20 ENCOUNTER — Other Ambulatory Visit (INDEPENDENT_AMBULATORY_CARE_PROVIDER_SITE_OTHER): Payer: BLUE CROSS/BLUE SHIELD

## 2015-03-20 DIAGNOSIS — E7800 Pure hypercholesterolemia, unspecified: Secondary | ICD-10-CM

## 2015-03-20 LAB — LIPID PANEL
CHOL/HDL RATIO: 5
Cholesterol: 220 mg/dL — ABNORMAL HIGH (ref 0–200)
HDL: 41.2 mg/dL (ref >39.00)
LDL Cholesterol: 157 mg/dL — ABNORMAL HIGH (ref 0–99)
NONHDL: 178.8
TRIGLYCERIDES: 107 mg/dL (ref 0.0–149.0)
VLDL: 21.4 mg/dL (ref 0.0–40.0)

## 2015-03-20 LAB — COMPREHENSIVE METABOLIC PANEL
ALBUMIN: 4 g/dL (ref 3.5–5.2)
ALT: 16 U/L (ref 0–53)
AST: 19 U/L (ref 0–37)
Alkaline Phosphatase: 63 U/L (ref 39–117)
BUN: 13 mg/dL (ref 6–23)
CHLORIDE: 106 meq/L (ref 96–112)
CO2: 30 mEq/L (ref 19–32)
Calcium: 9.6 mg/dL (ref 8.4–10.5)
Creatinine, Ser: 1.03 mg/dL (ref 0.40–1.50)
GFR: 78.76 mL/min (ref >60.00)
Glucose, Bld: 93 mg/dL (ref 70–99)
Potassium: 4.9 mEq/L (ref 3.5–5.1)
Sodium: 139 mEq/L (ref 135–145)
Total Bilirubin: 0.4 mg/dL (ref 0.2–1.2)
Total Protein: 6.7 g/dL (ref 6.0–8.3)

## 2015-03-27 ENCOUNTER — Ambulatory Visit (INDEPENDENT_AMBULATORY_CARE_PROVIDER_SITE_OTHER): Payer: BLUE CROSS/BLUE SHIELD | Admitting: Family Medicine

## 2015-03-27 ENCOUNTER — Encounter: Payer: Self-pay | Admitting: Family Medicine

## 2015-03-27 VITALS — BP 116/76 | HR 56 | Temp 98.2°F | Ht 67.5 in | Wt 173.2 lb

## 2015-03-27 DIAGNOSIS — E7800 Pure hypercholesterolemia, unspecified: Secondary | ICD-10-CM

## 2015-03-27 DIAGNOSIS — Z7189 Other specified counseling: Secondary | ICD-10-CM

## 2015-03-27 DIAGNOSIS — Z0000 Encounter for general adult medical examination without abnormal findings: Secondary | ICD-10-CM | POA: Diagnosis not present

## 2015-03-27 DIAGNOSIS — R1310 Dysphagia, unspecified: Secondary | ICD-10-CM

## 2015-03-27 DIAGNOSIS — Z1211 Encounter for screening for malignant neoplasm of colon: Secondary | ICD-10-CM

## 2015-03-27 NOTE — Assessment & Plan Note (Signed)
Routine anticipatory guidance given to patient.  See health maintenance. Tetanus 2013 Flu shot encouraged.  Prostate cancer screening and PSA options (with potential risks and benefits of testing vs not testing) were discussed along with recent recs/guidelines. He declined testing PSA at this point. D/w patient [id number] for colon cancer screening, including IFOB vs. colonoscopy. Risks and benefits of both were discussed and patient voiced understanding. Pt elects [id number].  Diet and exercise d/w pt. Doing well with both. Intentional weight loss noted.   Living will d/w pt. Wife designated if patient were incapacitated.  Heartburn is better since weight is down.  Swallowing is improved, no recent events.   His father is in the midst of a work up for a possible pancreatic lesion.  His PGM had pancreatic cancer.  Patient will update me when more information is available.

## 2015-03-27 NOTE — Assessment & Plan Note (Signed)
Improved with weight loss and better control of heartburn.  Doing well.

## 2015-03-27 NOTE — Assessment & Plan Note (Signed)
D/w pt about diet and exercise, he wants to continue on both. We agreed to recheck next year. Weight loss noted, dec in TG noted.

## 2015-03-27 NOTE — Progress Notes (Signed)
Pre visit review using our clinic review tool, if applicable. No additional management support is needed unless otherwise documented below in the visit note.  CPE- See plan.  Routine anticipatory guidance given to patient.  See health maintenance. Tetanus 2013 Flu shot encouraged.  Prostate cancer screening and PSA options (with potential risks and benefits of testing vs not testing) were discussed along with recent recs/guidelines. He declined testing PSA at this point. D/w patient [id number] for colon cancer screening, including IFOB vs. colonoscopy. Risks and benefits of both were discussed and patient voiced understanding. Pt elects FRT:[id number].  Diet and exercise d/w pt. Doing well with both. Intentional weight loss noted.   Living will d/w pt. Wife designated if patient were incapacitated.  Heartburn is better since weight is down.  Swallowing is improved, no recent events.   His father is in the midst of a work up for a possible pancreatic lesion.  His PGM had pancreatic cancer.  Patient will update me when more information is available.    HLD.  No meds.  D/w pt about diet and exercise, he wants to continue on both.  We agreed to recheck next year.  Weight loss noted, dec in TG noted.   PMH and SH reviewed  Meds, vitals, and allergies reviewed.   ROS: See HPI.  Otherwise negative.    GEN: nad, alert and oriented HEENT: mucous membranes moist NECK: supple w/o LA CV: rrr. PULM: ctab, no inc wob ABD: soft, +bs EXT: no edema SKIN: no acute rash

## 2015-03-27 NOTE — Patient Instructions (Addendum)
Go to the lab on the way out.  We'll contact you with your lab report. Take care.  Glad to see you.   Recheck in about 1 year, labs ahead of time.

## 2015-04-09 ENCOUNTER — Other Ambulatory Visit (INDEPENDENT_AMBULATORY_CARE_PROVIDER_SITE_OTHER): Payer: BLUE CROSS/BLUE SHIELD

## 2015-04-09 DIAGNOSIS — Z1211 Encounter for screening for malignant neoplasm of colon: Secondary | ICD-10-CM | POA: Diagnosis not present

## 2015-04-09 LAB — FECAL OCCULT BLOOD, IMMUNOCHEMICAL: FECAL OCCULT BLD: NEGATIVE

## 2015-04-10 ENCOUNTER — Encounter: Payer: Self-pay | Admitting: *Deleted

## 2015-09-05 ENCOUNTER — Encounter: Payer: Self-pay | Admitting: Family Medicine

## 2015-09-05 ENCOUNTER — Ambulatory Visit (INDEPENDENT_AMBULATORY_CARE_PROVIDER_SITE_OTHER): Payer: BLUE CROSS/BLUE SHIELD | Admitting: Family Medicine

## 2015-09-05 VITALS — BP 110/70 | HR 82 | Temp 98.8°F | Wt 178.8 lb

## 2015-09-05 DIAGNOSIS — M5432 Sciatica, left side: Secondary | ICD-10-CM

## 2015-09-05 NOTE — Progress Notes (Signed)
Pre visit review using our clinic review tool, if applicable. No additional management support is needed unless otherwise documented below in the visit note.  Lower back pain.  Over the last 2 months.  Has been waxing and waning.  Had seen chiropractor this week, with some help.  Lower back is stiff in the AM.  Better with AM ibuprofen- it helped today.  Still with pain but improved.  Pain prev down the L leg laterally.  No R leg sx.  No B/B sx.  No numbness.  No weakness.  No trauma.  Pain leaning forward.  No FCNAVD.  He has routine for stretching, single knee to chest, lower back stretches, calf stretches.    Meds, vitals, and allergies reviewed.   ROS: See HPI.  Otherwise, noncontributory.  nad ncat rrr ctab abd soft not ttp Back w/o midline pain.  Not ttp in L lower back but that is were he does have pain (even w/o palpation) S/S wnl BLE but L SLR positive.

## 2015-09-05 NOTE — Patient Instructions (Signed)
Up to 600mg  of ibuprofen with food up to 3 times a day.  Ice vs heat, whichever helps more.  Gently stretch- lower back, hips, hamstring and calves.  Update me as needed.  Take care.  Glad to see you.

## 2015-09-06 DIAGNOSIS — M5432 Sciatica, left side: Secondary | ICD-10-CM | POA: Insufficient documentation

## 2015-09-06 NOTE — Assessment & Plan Note (Addendum)
Better today. No emergent sx.  Up to 600mg  of ibuprofen with food up to 3 times a day.  Ice vs heat, whichever helps more.  Gently stretch- lower back, hips, hamstring and calves.  Update me as needed.  He agrees.  Reasonable to continue with chiropractor.

## 2016-03-10 ENCOUNTER — Other Ambulatory Visit: Payer: Self-pay | Admitting: Family Medicine

## 2016-03-10 DIAGNOSIS — E7800 Pure hypercholesterolemia, unspecified: Secondary | ICD-10-CM

## 2016-03-20 ENCOUNTER — Other Ambulatory Visit (INDEPENDENT_AMBULATORY_CARE_PROVIDER_SITE_OTHER): Payer: Self-pay

## 2016-03-20 DIAGNOSIS — E7800 Pure hypercholesterolemia, unspecified: Secondary | ICD-10-CM

## 2016-03-20 LAB — LIPID PANEL
Cholesterol: 221 mg/dL — ABNORMAL HIGH (ref 0–200)
HDL: 36.8 mg/dL — AB (ref >39.00)
LDL Cholesterol: 159 mg/dL — ABNORMAL HIGH (ref 0–99)
NonHDL: 184.4
TRIGLYCERIDES: 128 mg/dL (ref 0.0–149.0)
Total CHOL/HDL Ratio: 6
VLDL: 25.6 mg/dL (ref 0.0–40.0)

## 2016-03-20 LAB — COMPREHENSIVE METABOLIC PANEL
ALT: 17 U/L (ref 0–53)
AST: 19 U/L (ref 0–37)
Albumin: 4.2 g/dL (ref 3.5–5.2)
Alkaline Phosphatase: 55 U/L (ref 39–117)
BILIRUBIN TOTAL: 0.5 mg/dL (ref 0.2–1.2)
BUN: 13 mg/dL (ref 6–23)
CALCIUM: 9.5 mg/dL (ref 8.4–10.5)
CO2: 29 mEq/L (ref 19–32)
Chloride: 105 mEq/L (ref 96–112)
Creatinine, Ser: 0.94 mg/dL (ref 0.40–1.50)
GFR: 87.23 mL/min (ref >60.00)
Glucose, Bld: 91 mg/dL (ref 70–99)
Potassium: 4.4 mEq/L (ref 3.5–5.1)
Sodium: 140 mEq/L (ref 135–145)
Total Protein: 6.7 g/dL (ref 6.0–8.3)

## 2016-03-27 ENCOUNTER — Ambulatory Visit (INDEPENDENT_AMBULATORY_CARE_PROVIDER_SITE_OTHER): Payer: Self-pay | Admitting: Family Medicine

## 2016-03-27 ENCOUNTER — Encounter: Payer: Self-pay | Admitting: Family Medicine

## 2016-03-27 VITALS — BP 104/74 | HR 66 | Temp 98.5°F | Ht 68.0 in | Wt 177.5 lb

## 2016-03-27 DIAGNOSIS — Z125 Encounter for screening for malignant neoplasm of prostate: Secondary | ICD-10-CM

## 2016-03-27 DIAGNOSIS — Z0000 Encounter for general adult medical examination without abnormal findings: Secondary | ICD-10-CM

## 2016-03-27 DIAGNOSIS — E7800 Pure hypercholesterolemia, unspecified: Secondary | ICD-10-CM

## 2016-03-27 DIAGNOSIS — Z1211 Encounter for screening for malignant neoplasm of colon: Secondary | ICD-10-CM

## 2016-03-27 DIAGNOSIS — E785 Hyperlipidemia, unspecified: Secondary | ICD-10-CM

## 2016-03-27 NOTE — Assessment & Plan Note (Signed)
D/w pt.  With normal BP and sugar and no compelling FH of CAD, then with LDL at current level he doesn't likely need tx other than continued diet and exercise.  D/w pt.  He agrees.  He would prefer to avoid other med at this point.  We can recheck periodically.

## 2016-03-27 NOTE — Patient Instructions (Addendum)
Go to the lab on the way out.  We'll contact you with your lab report (stool cards). Recheck labs in about 1 year otherwise.  Take care.  Glad to see you.

## 2016-03-27 NOTE — Assessment & Plan Note (Signed)
Tetanus 2013 Flu shot encouraged.  Prostate cancer screening and PSA options (with potential risks and benefits of testing vs not testing) were discussed along with recent recs/guidelines. He wanted testing PSA with next set of labs. D/w patient [id number] for colon cancer screening, including IFOB vs. colonoscopy. Risks and benefits of both were discussed and patient voiced understanding. Pt elects [id number].  Diet and exercise d/w pt. Doing well with both. Intentional weight loss prev noted.  Living will d/w pt. Wife designated if patient were incapacitated. Heartburn is better since weight is down. Swallowing is still improved.  He usually eats slowly and doesn't have trouble.    His father ended up with a pancreatic cyst, not a cancer. His PGM had pancreatic cancer. [facility].  He gets occ/prn tx for sciatica when it flares.   He has "graduated" to yearly follow up with derm for routine skin checks.  No new lesions.

## 2016-03-27 NOTE — Progress Notes (Signed)
Pre visit review using our clinic review tool, if applicable. No additional management support is needed unless otherwise documented below in the visit note.  CPE- See plan.  Routine anticipatory guidance given to patient.  See health maintenance. Tetanus 2013 Flu shot encouraged.  Prostate cancer screening and PSA options (with potential risks and benefits of testing vs not testing) were discussed along with recent recs/guidelines. He wanted testing PSA with next set of labs. D/w patient [id number] for colon cancer screening, including IFOB vs. colonoscopy. Risks and benefits of both were discussed and patient voiced understanding. Pt elects [id number].  Diet and exercise d/w pt. Doing well with both. Intentional weight loss prev noted.  Living will d/w pt. Wife designated if patient were incapacitated. Heartburn is better since weight is down. Swallowing is still improved.  He usually eats slowly and doesn't have trouble.    His father ended up with a pancreatic cyst, not a cancer. His PGM had pancreatic cancer. [facility] HCV [facility].  He gets occ/prn tx for sciatica when it flares.   He has "graduated" to yearly follow up with derm for routine skin checks.  No new lesions.   PMH and SH reviewed  Meds, vitals, and allergies reviewed.   ROS: Per HPI.  Unless specifically indicated otherwise in HPI, the patient denies:  General: fever. Eyes: acute vision changes ENT: sore throat Cardiovascular: chest pain Respiratory: SOB GI: vomiting GU: dysuria Musculoskeletal: acute back pain Derm: acute rash Neuro: acute motor dysfunction Psych: worsening mood Endocrine: polydipsia Heme: bleeding Allergy: hayfever  GEN: nad, alert and oriented HEENT: mucous membranes moist NECK: supple w/o LA CV: rrr. PULM: ctab, no inc wob ABD: soft, +bs EXT: no edema SKIN: no acute rash

## 2016-04-22 ENCOUNTER — Other Ambulatory Visit (INDEPENDENT_AMBULATORY_CARE_PROVIDER_SITE_OTHER): Payer: Self-pay

## 2016-04-22 ENCOUNTER — Encounter: Payer: Self-pay | Admitting: *Deleted

## 2016-04-22 DIAGNOSIS — Z1211 Encounter for screening for malignant neoplasm of colon: Secondary | ICD-10-CM

## 2016-04-22 LAB — FECAL OCCULT BLOOD, IMMUNOCHEMICAL: Fecal Occult Bld: NEGATIVE

## 2017-03-25 ENCOUNTER — Other Ambulatory Visit (INDEPENDENT_AMBULATORY_CARE_PROVIDER_SITE_OTHER): Payer: Self-pay

## 2017-03-25 ENCOUNTER — Encounter (INDEPENDENT_AMBULATORY_CARE_PROVIDER_SITE_OTHER): Payer: Self-pay

## 2017-03-25 DIAGNOSIS — E785 Hyperlipidemia, unspecified: Secondary | ICD-10-CM

## 2017-03-25 DIAGNOSIS — Z125 Encounter for screening for malignant neoplasm of prostate: Secondary | ICD-10-CM

## 2017-03-25 LAB — COMPREHENSIVE METABOLIC PANEL
ALBUMIN: 4.2 g/dL (ref 3.5–5.2)
ALT: 19 U/L (ref 0–53)
AST: 19 U/L (ref 0–37)
Alkaline Phosphatase: 71 U/L (ref 39–117)
BUN: 14 mg/dL (ref 6–23)
CHLORIDE: 105 meq/L (ref 96–112)
CO2: 28 meq/L (ref 19–32)
Calcium: 9.3 mg/dL (ref 8.4–10.5)
Creatinine, Ser: 0.97 mg/dL (ref 0.40–1.50)
GFR: 83.83 mL/min (ref >60.00)
Glucose, Bld: 100 mg/dL — ABNORMAL HIGH (ref 70–99)
POTASSIUM: 4.5 meq/L (ref 3.5–5.1)
SODIUM: 139 meq/L (ref 135–145)
Total Bilirubin: 0.4 mg/dL (ref 0.2–1.2)
Total Protein: 6.5 g/dL (ref 6.0–8.3)

## 2017-03-25 LAB — LIPID PANEL
CHOLESTEROL: 214 mg/dL — AB (ref 0–200)
HDL: 45.7 mg/dL (ref >39.00)
LDL Cholesterol: 144 mg/dL — ABNORMAL HIGH (ref 0–99)
NonHDL: 168.54
Total CHOL/HDL Ratio: 5
Triglycerides: 124 mg/dL (ref 0.0–149.0)
VLDL: 24.8 mg/dL (ref 0.0–40.0)

## 2017-03-25 LAB — PSA: PSA: 0.89 ng/mL (ref 0.10–4.00)

## 2017-03-31 ENCOUNTER — Encounter: Payer: Self-pay | Admitting: Family Medicine

## 2017-04-06 ENCOUNTER — Telehealth: Payer: Self-pay | Admitting: Family Medicine

## 2017-04-06 ENCOUNTER — Ambulatory Visit (INDEPENDENT_AMBULATORY_CARE_PROVIDER_SITE_OTHER): Payer: Self-pay | Admitting: Family Medicine

## 2017-04-06 ENCOUNTER — Encounter: Payer: Self-pay | Admitting: Family Medicine

## 2017-04-06 VITALS — BP 122/80 | HR 70 | Temp 98.5°F | Ht 68.0 in | Wt 176.2 lb

## 2017-04-06 DIAGNOSIS — Z1211 Encounter for screening for malignant neoplasm of colon: Secondary | ICD-10-CM

## 2017-04-06 DIAGNOSIS — Z0000 Encounter for general adult medical examination without abnormal findings: Secondary | ICD-10-CM

## 2017-04-06 NOTE — Telephone Encounter (Signed)
Called pt to verify at $80 self payment paid at lab check in 03/25/17.

## 2017-04-06 NOTE — Patient Instructions (Addendum)
Check with your insurance to see if they will cover the shingles shot. Don't change your meds for now.  Go to the lab on the way out.  We'll contact you with your lab report. Update me as needed.  Keep working on diet and exercise.  Take care.  Glad to see you.

## 2017-04-06 NOTE — Progress Notes (Signed)
CPE- See plan.  Routine anticipatory guidance given to patient.  See health maintenance.  The possibility exists that previously documented standard health maintenance information may have been brought forward from a previous encounter into this note.  If needed, that same information has been updated to reflect the current situation based on today's encounter.    Tetanus 2013 Flu shot encouraged PNA shot not due, d/w pt Shingles shot d/w pt.  See AVS.   Prostate cancer screening d/w pt.  PSA wnl.  We can consider recheck next year, d/w pt.   D/w patient [id number] for colon cancer screening, including IFOB vs. colonoscopy. Risks and benefits of both were discussed and patient voiced understanding. Pt elects [id number].  Diet and exercise d/w pt. Doing well with both. Living will d/w pt. Wife designated if patient were incapacitated. Heartburn is still better since weight is down. Swallowing is still improved.  He usually eats slowly and doesn't have trouble.    His father ended up with a pancreatic cyst, not a cancer. His PGM had pancreatic cancer.d/w pt.  [facility]  He gets occ/prn tx for sciatica when it flares.  he is still stretching and trying to prevent flares.   He has "graduated" to yearly follow up with derm for routine skin checks.  No new lesions.  Labs d/w pt, esp lipids.   No FCNAVD, etc.  Feels well o/w.  Framingham score 9.5% but neither parent had h/o CAD. He wasn't enthused about starting a statin.    PMH and SH reviewed  Meds, vitals, and allergies reviewed.   ROS: Per HPI.  Unless specifically indicated otherwise in HPI, the patient denies:  General: fever. Eyes: acute vision changes ENT: sore throat Cardiovascular: chest pain Respiratory: SOB GI: vomiting GU: dysuria Musculoskeletal: acute back pain Derm: acute rash Neuro: acute motor dysfunction Psych: worsening mood Endocrine: polydipsia Heme:  bleeding Allergy: hayfever  GEN: nad, alert and oriented HEENT: mucous membranes moist NECK: supple w/o LA CV: rrr. PULM: ctab, no inc wob ABD: soft, +bs EXT: no edema SKIN: no acute rash

## 2017-04-06 NOTE — Assessment & Plan Note (Signed)
Tetanus 2013 Flu shot encouraged PNA shot not due, d/w pt Shingles shot d/w pt.  See AVS.   Prostate cancer screening d/w pt.  PSA wnl.  We can consider recheck next year, d/w pt.   D/w patient [id number] for colon cancer screening, including IFOB vs. colonoscopy. Risks and benefits of both were discussed and patient voiced understanding. Pt elects [id number].  Diet and exercise d/w pt. Doing well with both. Living will d/w pt. Wife designated if patient were incapacitated. Heartburn is still better since weight is down. Swallowing is still improved.  He usually eats slowly and doesn't have trouble.    His father ended up with a pancreatic cyst, not a cancer. His PGM had pancreatic cancer.d/w pt.  [facility]  He gets occ/prn tx for sciatica when it flares.  he is still stretching and trying to prevent flares.   He has "graduated" to yearly follow up with derm for routine skin checks.  No new lesions.  Labs d/w pt, esp lipids.   No FCNAVD, etc.  Feels well o/w.  Framingham score 9.5% but neither parent had h/o CAD. He wasn't enthused about starting a statin.  His lack of FH may lower his true risk.  D/w pt about diet and exercise and we can follow yearly.  He agrees.

## 2017-04-17 ENCOUNTER — Other Ambulatory Visit (INDEPENDENT_AMBULATORY_CARE_PROVIDER_SITE_OTHER): Payer: Self-pay

## 2017-04-17 DIAGNOSIS — Z1211 Encounter for screening for malignant neoplasm of colon: Secondary | ICD-10-CM

## 2017-04-17 LAB — FECAL OCCULT BLOOD, IMMUNOCHEMICAL: Fecal Occult Bld: NEGATIVE

## 2018-04-04 ENCOUNTER — Other Ambulatory Visit: Payer: Self-pay | Admitting: Family Medicine

## 2018-04-04 DIAGNOSIS — E7800 Pure hypercholesterolemia, unspecified: Secondary | ICD-10-CM

## 2018-04-07 ENCOUNTER — Other Ambulatory Visit (INDEPENDENT_AMBULATORY_CARE_PROVIDER_SITE_OTHER): Payer: Self-pay

## 2018-04-07 DIAGNOSIS — E7800 Pure hypercholesterolemia, unspecified: Secondary | ICD-10-CM

## 2018-04-07 LAB — COMPREHENSIVE METABOLIC PANEL
ALBUMIN: 4 g/dL (ref 3.5–5.2)
ALT: 18 U/L (ref 0–53)
AST: 19 U/L (ref 0–37)
Alkaline Phosphatase: 68 U/L (ref 39–117)
BILIRUBIN TOTAL: 0.3 mg/dL (ref 0.2–1.2)
BUN: 13 mg/dL (ref 6–23)
CALCIUM: 9.1 mg/dL (ref 8.4–10.5)
CO2: 28 mEq/L (ref 19–32)
CREATININE: 0.96 mg/dL (ref 0.40–1.50)
Chloride: 103 mEq/L (ref 96–112)
GFR: 84.55 mL/min (ref >60.00)
Glucose, Bld: 105 mg/dL — ABNORMAL HIGH (ref 70–99)
Potassium: 4.6 mEq/L (ref 3.5–5.1)
Sodium: 138 mEq/L (ref 135–145)
TOTAL PROTEIN: 6.7 g/dL (ref 6.0–8.3)

## 2018-04-07 LAB — LIPID PANEL
Cholesterol: 214 mg/dL — ABNORMAL HIGH (ref 0–200)
HDL: 37.4 mg/dL — AB (ref >39.00)
LDL Cholesterol: 138 mg/dL — ABNORMAL HIGH (ref 0–99)
NonHDL: 176.8
Total CHOL/HDL Ratio: 6
Triglycerides: 194 mg/dL — ABNORMAL HIGH (ref 0.0–149.0)
VLDL: 38.8 mg/dL (ref 0.0–40.0)

## 2018-04-09 ENCOUNTER — Encounter: Payer: Self-pay | Admitting: Family Medicine

## 2018-04-09 ENCOUNTER — Ambulatory Visit: Payer: Self-pay | Admitting: Family Medicine

## 2018-04-09 VITALS — BP 118/72 | HR 57 | Temp 98.5°F | Ht 68.0 in | Wt 181.2 lb

## 2018-04-09 DIAGNOSIS — Z0000 Encounter for general adult medical examination without abnormal findings: Secondary | ICD-10-CM

## 2018-04-09 DIAGNOSIS — Z7189 Other specified counseling: Secondary | ICD-10-CM

## 2018-04-09 DIAGNOSIS — Z85820 Personal history of malignant melanoma of skin: Secondary | ICD-10-CM

## 2018-04-09 DIAGNOSIS — E7800 Pure hypercholesterolemia, unspecified: Secondary | ICD-10-CM

## 2018-04-09 NOTE — Progress Notes (Signed)
CPE- See plan.  Routine anticipatory guidance given to patient.  See health maintenance.  The possibility exists that previously documented standard health maintenance information may have been brought forward from a previous encounter into this note.  If needed, that same information has been updated to reflect the current situation based on today's encounter.    Tetanus 2013 Flu shot encouraged PNA shot not due, d/w pt Shingles shot d/w pt.  See AVS.   Prostate cancer screening and PSA options (with potential risks and benefits of testing vs not testing) were discussed along with recent recs/guidelines.  He declined testing PSA at this point.  He preferred not to check PSA since he has not LUTS and no FH.   D/w patient [id number] for colon cancer screening, including IFOB vs. colonoscopy. Risks and benefits of both were discussed and patient voiced understanding. Pt elects to check on cologuard.  See AVS.   Diet and exercise d/w pt. Doing well with both. Living will d/w pt. Wife designated if patient were incapacitated. [facility]  He gets occ/prn tx for sciatica when it flares. He is still stretching and trying to prevent flares.    He has "graduated" to yearly follow up with derm for routine skin checks. No new lesions.   His father and step mother died in the last year, condolences offered.    Lipids and sugar d/w pt.  ASCVD score >10%, d/w pt.  Encouraged statin, he'll consider.   PMH and SH reviewed  Meds, vitals, and allergies reviewed.   ROS: Per HPI.  Unless specifically indicated otherwise in HPI, the patient denies:  General: fever. Eyes: acute vision changes ENT: sore throat Cardiovascular: chest pain Respiratory: SOB GI: vomiting GU: dysuria Musculoskeletal: acute back pain Derm: acute rash Neuro: acute motor dysfunction Psych: worsening mood Endocrine: polydipsia Heme: bleeding Allergy: hayfever  GEN: nad,  alert and oriented HEENT: mucous membranes moist NECK: supple w/o LA CV: rrr. PULM: ctab, no inc wob ABD: soft, +bs EXT: no edema SKIN: no acute rash

## 2018-04-09 NOTE — Patient Instructions (Addendum)
Check with your insurance to see if they will cover the shingrix shot. I would get a flu shot each fall.   Check to see how much cologuard will cost you and let me know if you want to get that done.  Call [phone].   It would be reasonable to try taking 10mg  of lipitor a day.  Let me know if you want me to send it in.  Take care.  Glad to see you.

## 2018-04-11 NOTE — Assessment & Plan Note (Signed)
Living will d/w pt.  Wife designated if patient were incapacitated.   ?

## 2018-04-11 NOTE — Assessment & Plan Note (Signed)
Per dermatology.  I will defer. 

## 2018-04-11 NOTE — Assessment & Plan Note (Signed)
Tetanus 2013 Flu shot encouraged PNA shot not due, d/w pt Shingles shot d/w pt.  See AVS.   Prostate cancer screening and PSA options (with potential risks and benefits of testing vs not testing) were discussed along with recent recs/guidelines.  He declined testing PSA at this point.  He preferred not to check PSA since he has not LUTS and no FH.   D/w patient [id number] for colon cancer screening, including IFOB vs. colonoscopy. Risks and benefits of both were discussed and patient voiced understanding. Pt elects to check on cologuard.  See AVS.   Diet and exercise d/w pt. Doing well with both. Living will d/w pt. Wife designated if patient were incapacitated. [facility]  He gets occ/prn tx for sciatica when it flares. He is still stretching and trying to prevent flares.

## 2018-04-11 NOTE — Assessment & Plan Note (Signed)
ASCVD score >10%, d/w pt.  Encouraged statin, he'll consider.  He will update me if he is willing to start medication. Continue work on diet and exercise.

## 2018-04-14 ENCOUNTER — Other Ambulatory Visit: Payer: Self-pay

## 2018-09-14 ENCOUNTER — Encounter: Payer: Self-pay | Admitting: Family Medicine

## 2018-09-14 ENCOUNTER — Ambulatory Visit: Payer: Self-pay | Admitting: Family Medicine

## 2018-09-14 DIAGNOSIS — R059 Cough, unspecified: Secondary | ICD-10-CM

## 2018-09-14 DIAGNOSIS — R05 Cough: Secondary | ICD-10-CM

## 2018-09-14 MED ORDER — BENZONATATE 200 MG PO CAPS: 200.0000 mg | ORAL_CAPSULE | Freq: Three times a day (TID) | ORAL | 1 refills | Status: DC | PRN

## 2018-09-14 MED ORDER — DOXYCYCLINE HYCLATE 100 MG PO TABS: 100.0000 mg | ORAL_TABLET | Freq: Two times a day (BID) | ORAL | 0 refills | Status: DC

## 2018-09-14 NOTE — Patient Instructions (Signed)
Presumed bronchitis.   Rest and fluids.   Start doxycycline.  Tessalon if needed for the cough.  Update me as needed.  Take care.  Glad to see you.

## 2018-09-14 NOTE — Progress Notes (Signed)
duration of symptoms: ~3 weeks.  Rhinorrhea:yes, bloody rhinorrhea.   Congestion: yes ear pain: pressure but not pain.  sore throat: no Cough: yes, some sputum, discolored.  Myalgias: no No vomiting.  No diarrhea but some loose stools.   No rash.   other concerns: used otc meds in the meantime initially but not resolved o/w.  Now with chest congestion.  Not SOB.    Per HPI unless specifically indicated in ROS section   Meds, vitals, and allergies reviewed.   GEN: nad, alert and oriented HEENT: mucous membranes moist, TM w/o erythema, nasal epithelium injected, OP with cobblestoning NECK: supple w/o LA CV: rrr. PULM: ctab, no inc wob ABD: soft, +bs EXT: no edema Sinuses not tender to palpation.

## 2018-09-15 DIAGNOSIS — R059 Cough, unspecified: Secondary | ICD-10-CM | POA: Insufficient documentation

## 2018-09-15 DIAGNOSIS — R05 Cough: Secondary | ICD-10-CM | POA: Insufficient documentation

## 2018-09-15 NOTE — Assessment & Plan Note (Signed)
Presumed bronchitis.   Rest and fluids.   Start doxycycline.  Tessalon if needed for the cough.  Update me as needed.  He agrees with plan.  Nontoxic.  Okay for outpatient follow-up.

## 2019-04-07 ENCOUNTER — Other Ambulatory Visit: Payer: Self-pay

## 2019-04-07 ENCOUNTER — Other Ambulatory Visit (INDEPENDENT_AMBULATORY_CARE_PROVIDER_SITE_OTHER): Payer: Self-pay

## 2019-04-07 ENCOUNTER — Other Ambulatory Visit: Payer: Self-pay | Admitting: Family Medicine

## 2019-04-07 DIAGNOSIS — E7800 Pure hypercholesterolemia, unspecified: Secondary | ICD-10-CM

## 2019-04-07 LAB — COMPREHENSIVE METABOLIC PANEL
ALT: 20 U/L (ref 0–53)
AST: 24 U/L (ref 0–37)
Albumin: 4.2 g/dL (ref 3.5–5.2)
Alkaline Phosphatase: 62 U/L (ref 39–117)
BUN: 13 mg/dL (ref 6–23)
CO2: 28 mEq/L (ref 19–32)
Calcium: 9.5 mg/dL (ref 8.4–10.5)
Chloride: 103 mEq/L (ref 96–112)
Creatinine, Ser: 1.02 mg/dL (ref 0.40–1.50)
GFR: 73.93 mL/min (ref >60.00)
Glucose, Bld: 94 mg/dL (ref 70–99)
Potassium: 4.7 mEq/L (ref 3.5–5.1)
Sodium: 139 mEq/L (ref 135–145)
Total Bilirubin: 0.4 mg/dL (ref 0.2–1.2)
Total Protein: 7.1 g/dL (ref 6.0–8.3)

## 2019-04-07 LAB — LIPID PANEL
Cholesterol: 241 mg/dL — ABNORMAL HIGH (ref 0–200)
HDL: 42.3 mg/dL (ref >39.00)
NonHDL: 198.68
Total CHOL/HDL Ratio: 6
Triglycerides: 214 mg/dL — ABNORMAL HIGH (ref 0.0–149.0)
VLDL: 42.8 mg/dL — ABNORMAL HIGH (ref 0.0–40.0)

## 2019-04-07 LAB — LDL CHOLESTEROL, DIRECT: Direct LDL: 151 mg/dL

## 2019-04-14 ENCOUNTER — Ambulatory Visit (INDEPENDENT_AMBULATORY_CARE_PROVIDER_SITE_OTHER): Payer: Self-pay | Admitting: Family Medicine

## 2019-04-14 ENCOUNTER — Telehealth: Payer: Self-pay

## 2019-04-14 ENCOUNTER — Encounter: Payer: Self-pay | Admitting: Family Medicine

## 2019-04-14 DIAGNOSIS — Z1211 Encounter for screening for malignant neoplasm of colon: Secondary | ICD-10-CM

## 2019-04-14 DIAGNOSIS — E7800 Pure hypercholesterolemia, unspecified: Secondary | ICD-10-CM

## 2019-04-14 DIAGNOSIS — Z7189 Other specified counseling: Secondary | ICD-10-CM

## 2019-04-14 DIAGNOSIS — Z0000 Encounter for general adult medical examination without abnormal findings: Secondary | ICD-10-CM

## 2019-04-14 MED ORDER — ATORVASTATIN CALCIUM 10 MG PO TABS: 10.0000 mg | ORAL_TABLET | Freq: Every day | ORAL | 3 refills | Status: DC

## 2019-04-14 NOTE — Telephone Encounter (Signed)
Pt had virtual appt with Dr. Damita Dunnings this morning at 8:30

## 2019-04-14 NOTE — Telephone Encounter (Signed)
[facility] Night - Client Nonclinical Telephone Record AccessNurse Client [facility] Night - Client Client Site [place] - Night Physician Renford Dills - MD Contact Type Call Who Is Calling Patient / Member / Family / Caregiver Caller Name Othman Mikula Caller Phone Number [phone] Patient Name Charles Collins Patient DOB 11-Apr-2056 Call Type Message Only Information Provided Reason for Call Request to Reschedule Office Appointment Initial Comment has appt. today @ 8:30am, wants to switch the appt. to a virtual and not in office,. pls call back

## 2019-04-14 NOTE — Progress Notes (Signed)
Virtual visit completed through WebEx or similar program Patient location: home  Provider location: Financial controller at [facility], office   Pandemic considerations d/w pt.   Limitations and rationale for visit method d/w patient.  Patient agreed to proceed.   CC: CPE  HPI:  Tetanus 2013 Flu shot encouraged PNA shot not due, d/w pt Shingles shot d/w pt. Out of stock.   Prostate cancer screening and PSA options (with potential risks and benefits of testing vs not testing) were discussed along with recent recs/guidelines.  He declined testing PSA at this point.  He preferred not to check PSA since he has not LUTS and no FH.   D/w patient [id number] for colon cancer screening, including IFOB vs. colonoscopy.  Risks and benefits of both were discussed and patient voiced understanding.  Pt elects SLH:TDSK. Diet and exercise d/w pt.  Living will d/w pt. Wife designated if patient were incapacitated. [facility] and [facility]   HLD.  ASCVD prev >10%, and lipids are higher now.  D/w pt.  Reasonable to start lipitor.  If aches, then he'll update me.  If tolerated, then he'll call about a lab visit in about 3 months.    He has routine derm f/u.    PMH SH FH d/w pt.    Meds and allergies reviewed.   ROS: Per HPI unless specifically indicated in ROS section   NAD Speech wnl  A/P:  Tetanus 2013 Flu shot encouraged PNA shot not due, d/w pt Shingles shot d/w pt. Out of stock.   Prostate cancer screening and PSA options (with potential risks and benefits of testing vs not testing) were discussed along with recent recs/guidelines.  He declined testing PSA at this point.  He preferred not to check PSA since he has not LUTS and no FH.   D/w patient AJ:[id number] for colon cancer screening, including IFOB vs. colonoscopy.  Risks and benefits of both were discussed and patient voiced understanding.  Pt elects IOM:BTDH. Diet and exercise d/w pt.  Living will d/w  pt. Wife designated if patient were incapacitated. [facility] and HCV [facility]  He has routine derm f/u.    HLD.  ASCVD prev >10%, and lipids are higher now.  D/w pt.  Reasonable to start lipitor.  If aches, then he'll update me.  If tolerated, then he'll call about a lab visit in about 3 months.

## 2019-04-14 NOTE — Assessment & Plan Note (Signed)
Living will d/w pt.  Wife designated if patient were incapacitated.   ?

## 2019-04-14 NOTE — Assessment & Plan Note (Signed)
HLD.  ASCVD prev >10%, and lipids are higher now.  D/w pt.  Reasonable to start lipitor.  If aches, then he'll update me.  If tolerated, then he'll call about a lab visit in about 3 months.  He agrees.  Continue work on diet and exercise.

## 2019-04-14 NOTE — Assessment & Plan Note (Signed)
Tetanus 2013 Flu shot encouraged PNA shot not due, d/w pt Shingles shot d/w pt. Out of stock.   Prostate cancer screening and PSA options (with potential risks and benefits of testing vs not testing) were discussed along with recent recs/guidelines.  He declined testing PSA at this point.  He preferred not to check PSA since he has not LUTS and no FH.   D/w patient [id number] for colon cancer screening, including IFOB vs. colonoscopy.  Risks and benefits of both were discussed and patient voiced understanding.  Pt elects EAK:LTYV. Diet and exercise d/w pt.  Living will d/w pt. Wife designated if patient were incapacitated. [facility] and HCV [facility]  He has routine derm f/u.

## 2019-04-28 ENCOUNTER — Other Ambulatory Visit (INDEPENDENT_AMBULATORY_CARE_PROVIDER_SITE_OTHER): Payer: Self-pay

## 2019-04-28 DIAGNOSIS — Z1211 Encounter for screening for malignant neoplasm of colon: Secondary | ICD-10-CM

## 2019-04-28 LAB — FECAL OCCULT BLOOD, IMMUNOCHEMICAL: Fecal Occult Bld: NEGATIVE

## 2019-05-23 ENCOUNTER — Ambulatory Visit: Payer: Self-pay | Admitting: Family Medicine

## 2019-05-23 ENCOUNTER — Other Ambulatory Visit: Payer: Self-pay

## 2019-05-23 ENCOUNTER — Encounter: Payer: Self-pay | Admitting: Family Medicine

## 2019-05-23 DIAGNOSIS — M545 Low back pain, unspecified: Secondary | ICD-10-CM

## 2019-05-23 MED ORDER — TIZANIDINE HCL 2 MG PO CAPS: 2.0000 mg | ORAL_CAPSULE | Freq: Three times a day (TID) | ORAL | 0 refills | Status: DC | PRN

## 2019-05-23 MED ORDER — PREDNISONE 10 MG PO TABS: ORAL_TABLET | ORAL | 0 refills | Status: DC

## 2019-05-23 NOTE — Patient Instructions (Signed)
Prednisone with food.  Stop ibuprofen.  Tylenol is okay.  Tizanidine if needed for spasms.   Continue biofreeze as needed.  Keep stretching.  Update me as needed.  Take care.  Glad to see you.

## 2019-05-23 NOTE — Progress Notes (Signed)
Pandemic considerations d/w pt.    He likely strained a muscle using a hammer drill.  No pain in the midst of use.  Pain the next day.  Taking ibuprofen and tylenol.  Went to [employer].  Yesterday with less pain but pain returned over night.  B lower back pain, just above the belt.  L medial quad area pain, "like a lightning bolt" into the thigh.  No bruising.  Pain worse with cough.  No FCNAVD.  3/10 pain, better standing.  More pain at night/early AM until yesterday AM.  No weakness.  No B/B.    Meds, vitals, and allergies reviewed.   ROS: Per HPI unless specifically indicated in ROS section   nad ncat rrr ctab abd soft, not ttp Back not tender to palpation midline. Lower back not tender. Able to bear weight.   SLR neg Distally neurovascular intact on gross exam.

## 2019-05-25 NOTE — Assessment & Plan Note (Signed)
Okay for outpatient follow-up.  No need for imaging at this point. Prednisone with food.  Stop ibuprofen.  Tylenol is okay.  Tizanidine if needed for spasms.   Continue biofreeze as needed.  Keep stretching.  Update me as needed.  He agrees.

## 2019-11-25 ENCOUNTER — Other Ambulatory Visit: Payer: Self-pay

## 2019-11-25 ENCOUNTER — Ambulatory Visit (INDEPENDENT_AMBULATORY_CARE_PROVIDER_SITE_OTHER): Payer: Self-pay | Admitting: Podiatry

## 2019-11-25 ENCOUNTER — Ambulatory Visit: Payer: Self-pay

## 2019-11-25 DIAGNOSIS — M722 Plantar fascial fibromatosis: Secondary | ICD-10-CM

## 2019-11-25 MED ORDER — DICLOFENAC SODIUM 75 MG PO TBEC: 75.0000 mg | DELAYED_RELEASE_TABLET | Freq: Two times a day (BID) | ORAL | 1 refills | Status: DC

## 2019-11-27 NOTE — Progress Notes (Signed)
   Subjective: 64 y.o. male presenting today as a new patient with a chief complaint of sharp pain located at the left heel that has been ongoing for the past 6 months. Walking and standing increases the pain. He has been using insoles for treatment with minimal relief. Patient is here for further evaluation and treatment.   Past Medical History:  Diagnosis Date  . Cancer ([place]) 11/2004   Melanoma, R Knee by Dr Nehemiah Massed  . Diverticulosis    DIVERTICULOSIS ISCHEMIC COLITIS VIA COLONOSCOPY(DR. SEIGAL):(01/2001)  . Hx of dysplastic nevus 06/27/2010   L dorsum mid lat. foot     Objective: Physical Exam General: The patient is alert and oriented x3 in no acute distress.  Dermatology: Skin is warm, dry and supple bilateral lower extremities. Negative for open lesions or macerations bilateral.   Vascular: Dorsalis Pedis and Posterior Tibial pulses palpable bilateral.  Capillary fill time is immediate to all digits.  Neurological: Epicritic and protective threshold intact bilateral.   Musculoskeletal: Tenderness to palpation to the plantar aspect of the left heel along the plantar fascia. All other joints range of motion within normal limits bilateral. Strength 5/5 in all groups bilateral.   Radiographic exam: Normal osseous mineralization. Joint spaces preserved. No fracture/dislocation/boney destruction. No other soft tissue abnormalities or radiopaque foreign bodies.   Assessment: 1. Plantar fasciitis left foot  Plan of Care:  1. Patient evaluated. Xrays reviewed.   2. Injection of 0.5cc Celestone soluspan injected into the left plantar fascia.  3. Powerstep OTC insoles dispensed.  4. Prescription for Diclofenac 75 mg BID provided to patient. 5. Plantar fascial band(s) dispensed  6. Instructed patient regarding therapies and modalities at home to alleviate symptoms.  7. Return to clinic in 4 weeks.    Clinical biochemist.    Edrick Kins, DPM [facility]  Dr.  Edrick Kins, DPM    [address], [place] [address]                Office [phone]  Fax [phone]

## 2019-12-05 ENCOUNTER — Encounter: Payer: Self-pay | Admitting: Family Medicine

## 2019-12-12 ENCOUNTER — Other Ambulatory Visit: Payer: Self-pay

## 2019-12-12 ENCOUNTER — Other Ambulatory Visit (INDEPENDENT_AMBULATORY_CARE_PROVIDER_SITE_OTHER): Payer: Self-pay

## 2019-12-12 DIAGNOSIS — E7800 Pure hypercholesterolemia, unspecified: Secondary | ICD-10-CM

## 2019-12-12 LAB — LIPID PANEL
Cholesterol: 230 mg/dL — ABNORMAL HIGH (ref 0–200)
HDL: 40.6 mg/dL (ref >39.00)
NonHDL: 189.13
Total CHOL/HDL Ratio: 6
Triglycerides: 205 mg/dL — ABNORMAL HIGH (ref 0.0–149.0)
VLDL: 41 mg/dL — ABNORMAL HIGH (ref 0.0–40.0)

## 2019-12-12 LAB — LDL CHOLESTEROL, DIRECT: Direct LDL: 151 mg/dL

## 2019-12-16 ENCOUNTER — Other Ambulatory Visit: Payer: Self-pay | Admitting: Family Medicine

## 2019-12-16 DIAGNOSIS — E7800 Pure hypercholesterolemia, unspecified: Secondary | ICD-10-CM

## 2019-12-23 ENCOUNTER — Ambulatory Visit (INDEPENDENT_AMBULATORY_CARE_PROVIDER_SITE_OTHER): Payer: Self-pay | Admitting: Podiatry

## 2019-12-23 ENCOUNTER — Other Ambulatory Visit: Payer: Self-pay

## 2019-12-23 DIAGNOSIS — M722 Plantar fascial fibromatosis: Secondary | ICD-10-CM

## 2019-12-26 NOTE — Progress Notes (Signed)
   HPI: 64 y.o. male presenting today for follow up evaluation of plantar fasciitis of the left foot. He states he is doing well and has improved significantly. He states the injections, insoles and fascial brace have helped alleviate his pain. He denies any worsening factors. Patient is here for further evaluation and treatment.   Past Medical History:  Diagnosis Date  . Cancer ([place]) 11/2004   Melanoma, R Knee by Dr Nehemiah Massed  . Diverticulosis    DIVERTICULOSIS ISCHEMIC COLITIS VIA COLONOSCOPY(DR. SEIGAL):(01/2001)  . Hx of dysplastic nevus 06/27/2010   L dorsum mid lat. foot     Physical Exam: General: The patient is alert and oriented x3 in no acute distress.  Dermatology: Skin is warm, dry and supple bilateral lower extremities. Negative for open lesions or macerations.  Vascular: Palpable pedal pulses bilaterally. No edema or erythema noted. Capillary refill within normal limits.  Neurological: Epicritic and protective threshold grossly intact bilaterally.   Musculoskeletal Exam: Range of motion within normal limits to all pedal and ankle joints bilateral. Muscle strength 5/5 in all groups bilateral.   Assessment: 1. Plantar fasciitis left foot - resolved    Plan of Care:  1. Patient evaluated.  2. Continue taking Diclofenac as needed.  3. Continue using OTC Powerstep insoles and plantar fascial brace.  4. Return to clinic as needed.   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       Edrick Kins, DPM [facility]  Dr. Edrick Kins, DPM    [address], [place] [address]                Office [phone]  Fax [phone]

## 2020-01-20 ENCOUNTER — Other Ambulatory Visit: Payer: Self-pay | Admitting: Podiatry

## 2020-02-10 ENCOUNTER — Ambulatory Visit (INDEPENDENT_AMBULATORY_CARE_PROVIDER_SITE_OTHER): Payer: Self-pay | Admitting: Podiatry

## 2020-02-10 ENCOUNTER — Other Ambulatory Visit: Payer: Self-pay

## 2020-02-10 DIAGNOSIS — M722 Plantar fascial fibromatosis: Secondary | ICD-10-CM

## 2020-02-10 MED ORDER — DICLOFENAC SODIUM 75 MG PO TBEC: 75.0000 mg | DELAYED_RELEASE_TABLET | Freq: Two times a day (BID) | ORAL | 1 refills | Status: DC

## 2020-02-13 NOTE — Progress Notes (Signed)
   Subjective: 64 y.o. male presenting today with a chief complaint of plantar fasciitis of the left foot that began flaring up about one week ago. He has been doing well but states he needs a refill on Diclofenac. He continues to use the plantar fascial brace and insoles as directed. Being on the foot for prolonged periods of time increases the pain. Patient is here for further evaluation and treatment.   Past Medical History:  Diagnosis Date  . Cancer ([place]) 11/2004   Melanoma, R Knee by Dr Nehemiah Massed  . Diverticulosis    DIVERTICULOSIS ISCHEMIC COLITIS VIA COLONOSCOPY(DR. SEIGAL):(01/2001)  . Hx of dysplastic nevus 06/27/2010   L dorsum mid lat. foot     Objective: Physical Exam General: The patient is alert and oriented x3 in no acute distress.  Dermatology: Skin is warm, dry and supple bilateral lower extremities. Negative for open lesions or macerations bilateral.   Vascular: Dorsalis Pedis and Posterior Tibial pulses palpable bilateral.  Capillary fill time is immediate to all digits.  Neurological: Epicritic and protective threshold intact bilateral.   Musculoskeletal: Tenderness to palpation to the plantar aspect of the left heel along the plantar fascia. All other joints range of motion within normal limits bilateral. Strength 5/5 in all groups bilateral.    Assessment: 1. Plantar fasciitis left foot  Plan of Care:  1. Patient evaluated.  2. Injection of 0.5cc Celestone soluspan injected into the left plantar fascia.  3. Refill prescription for Diclofenac provided to patient.  4. Continue using plantar fascial brace.  5. Return to clinic as needed.      Edrick Kins, DPM [facility]  Dr. Edrick Kins, DPM    [address], [place] [address]                Office [phone]  Fax [phone]

## 2020-04-08 ENCOUNTER — Other Ambulatory Visit: Payer: Self-pay | Admitting: Family Medicine

## 2020-04-08 DIAGNOSIS — E7800 Pure hypercholesterolemia, unspecified: Secondary | ICD-10-CM

## 2020-04-09 ENCOUNTER — Encounter: Payer: Self-pay | Admitting: Radiology

## 2020-04-09 ENCOUNTER — Other Ambulatory Visit: Payer: Self-pay

## 2020-04-09 ENCOUNTER — Observation Stay
Admission: EM | Admit: 2020-04-09 | Discharge: 2020-04-10 | Disposition: A | Discharge status: Home / Self Care | Payer: Self-pay | Attending: Hospitalist | Admitting: Hospitalist

## 2020-04-09 ENCOUNTER — Emergency Department: Payer: Self-pay

## 2020-04-09 DIAGNOSIS — Z85820 Personal history of malignant melanoma of skin: Secondary | ICD-10-CM | POA: Insufficient documentation

## 2020-04-09 DIAGNOSIS — K5730 Diverticulosis of large intestine without perforation or abscess without bleeding: Secondary | ICD-10-CM | POA: Insufficient documentation

## 2020-04-09 DIAGNOSIS — Z87891 Personal history of nicotine dependence: Secondary | ICD-10-CM | POA: Insufficient documentation

## 2020-04-09 DIAGNOSIS — Z791 Long term (current) use of non-steroidal anti-inflammatories (NSAID): Secondary | ICD-10-CM | POA: Insufficient documentation

## 2020-04-09 DIAGNOSIS — R1013 Epigastric pain: Secondary | ICD-10-CM | POA: Insufficient documentation

## 2020-04-09 DIAGNOSIS — R112 Nausea with vomiting, unspecified: Secondary | ICD-10-CM | POA: Diagnosis present

## 2020-04-09 DIAGNOSIS — R109 Unspecified abdominal pain: Secondary | ICD-10-CM | POA: Diagnosis present

## 2020-04-09 DIAGNOSIS — Z79899 Other long term (current) drug therapy: Secondary | ICD-10-CM | POA: Insufficient documentation

## 2020-04-09 DIAGNOSIS — M5136 Other intervertebral disc degeneration, lumbar region: Secondary | ICD-10-CM | POA: Insufficient documentation

## 2020-04-09 DIAGNOSIS — Z20822 Contact with and (suspected) exposure to covid-19: Secondary | ICD-10-CM | POA: Insufficient documentation

## 2020-04-09 DIAGNOSIS — B9681 Helicobacter pylori [H. pylori] as the cause of diseases classified elsewhere: Secondary | ICD-10-CM | POA: Insufficient documentation

## 2020-04-09 DIAGNOSIS — J439 Emphysema, unspecified: Secondary | ICD-10-CM | POA: Insufficient documentation

## 2020-04-09 DIAGNOSIS — N281 Cyst of kidney, acquired: Secondary | ICD-10-CM | POA: Insufficient documentation

## 2020-04-09 DIAGNOSIS — R197 Diarrhea, unspecified: Secondary | ICD-10-CM

## 2020-04-09 DIAGNOSIS — K921 Melena: Secondary | ICD-10-CM | POA: Diagnosis present

## 2020-04-09 DIAGNOSIS — K2950 Unspecified chronic gastritis without bleeding: Principal | ICD-10-CM | POA: Insufficient documentation

## 2020-04-09 DIAGNOSIS — K4020 Bilateral inguinal hernia, without obstruction or gangrene, not specified as recurrent: Secondary | ICD-10-CM | POA: Insufficient documentation

## 2020-04-09 DIAGNOSIS — I728 Aneurysm of other specified arteries: Secondary | ICD-10-CM | POA: Insufficient documentation

## 2020-04-09 DIAGNOSIS — E785 Hyperlipidemia, unspecified: Secondary | ICD-10-CM | POA: Diagnosis present

## 2020-04-09 LAB — CBC
HCT: 44.5 % (ref 39.0–52.0)
HCT: 44.4 % (ref 39.0–52.0)
HCT: 42 % (ref 39.0–52.0)
Hemoglobin: 15.2 g/dL (ref 13.0–17.0)
Hemoglobin: 15.3 g/dL (ref 13.0–17.0)
Hemoglobin: 14 g/dL (ref 13.0–17.0)
MCH: 30.7 pg (ref 26.0–34.0)
MCH: 30.9 pg (ref 26.0–34.0)
MCH: 30.8 pg (ref 26.0–34.0)
MCHC: 34.2 g/dL (ref 30.0–36.0)
MCHC: 34.5 g/dL (ref 30.0–36.0)
MCHC: 33.3 g/dL (ref 30.0–36.0)
MCV: 89.9 fL (ref 80.0–100.0)
MCV: 89.7 fL (ref 80.0–100.0)
MCV: 92.5 fL (ref 80.0–100.0)
Platelets: 220 10*3/uL (ref 150–400)
Platelets: 191 10*3/uL (ref 150–400)
Platelets: 185 10*3/uL (ref 150–400)
RBC: 4.95 MIL/uL (ref 4.22–5.81)
RBC: 4.95 MIL/uL (ref 4.22–5.81)
RBC: 4.54 MIL/uL (ref 4.22–5.81)
RDW: 12.7 % (ref 11.5–15.5)
RDW: 12.9 % (ref 11.5–15.5)
RDW: 12.8 % (ref 11.5–15.5)
WBC: 7.6 10*3/uL (ref 4.0–10.5)
WBC: 10 10*3/uL (ref 4.0–10.5)
WBC: 11.8 10*3/uL — ABNORMAL HIGH (ref 4.0–10.5)
nRBC: 0 % (ref 0.0–0.2)
nRBC: 0 % (ref 0.0–0.2)
nRBC: 0 % (ref 0.0–0.2)

## 2020-04-09 LAB — COMPREHENSIVE METABOLIC PANEL
ALT: 28 U/L (ref 0–44)
AST: 27 U/L (ref 15–41)
Albumin: 4 g/dL (ref 3.5–5.0)
Alkaline Phosphatase: 70 U/L (ref 38–126)
Anion gap: 9 (ref 5–15)
BUN: 13 mg/dL (ref 8–23)
CO2: 26 mmol/L (ref 22–32)
Calcium: 9.4 mg/dL (ref 8.9–10.3)
Chloride: 103 mmol/L (ref 98–111)
Creatinine, Ser: 1.01 mg/dL (ref 0.61–1.24)
GFR calc Af Amer: >60 mL/min (ref >60)
GFR calc non Af Amer: >60 mL/min (ref >60)
Glucose, Bld: 124 mg/dL — ABNORMAL HIGH (ref 70–99)
Potassium: 4.7 mmol/L (ref 3.5–5.1)
Sodium: 138 mmol/L (ref 135–145)
Total Bilirubin: 0.6 mg/dL (ref 0.3–1.2)
Total Protein: 7 g/dL (ref 6.5–8.1)

## 2020-04-09 LAB — TYPE AND SCREEN
ABO/RH(D): O POS
Antibody Screen: NEGATIVE

## 2020-04-09 LAB — URINALYSIS, COMPLETE (UACMP) WITH MICROSCOPIC
Bacteria, UA: NONE SEEN
Bilirubin Urine: NEGATIVE
Glucose, UA: NEGATIVE mg/dL
Hgb urine dipstick: NEGATIVE
Ketones, ur: NEGATIVE mg/dL
Leukocytes,Ua: NEGATIVE
Nitrite: NEGATIVE
Protein, ur: NEGATIVE mg/dL
Specific Gravity, Urine: 1.015 (ref 1.005–1.030)
Squamous Epithelial / HPF: NONE SEEN (ref 0–5)
pH: 5 (ref 5.0–8.0)

## 2020-04-09 LAB — PROTIME-INR
INR: 1 (ref 0.8–1.2)
Prothrombin Time: 12.8 seconds (ref 11.4–15.2)

## 2020-04-09 LAB — LIPASE, BLOOD: Lipase: 28 U/L (ref 11–51)

## 2020-04-09 LAB — SARS CORONAVIRUS 2 BY RT PCR (HOSPITAL ORDER, PERFORMED IN ~~LOC~~ HOSPITAL LAB): SARS Coronavirus 2: NEGATIVE

## 2020-04-09 LAB — APTT: aPTT: 30 seconds (ref 24–36)

## 2020-04-09 LAB — TROPONIN I (HIGH SENSITIVITY): Troponin I (High Sensitivity): 3 ng/L (ref <18)

## 2020-04-09 MED ORDER — PANTOPRAZOLE SODIUM 40 MG IV SOLR
40.0000 mg | Freq: Once | INTRAVENOUS | Status: AC
  Administered 2020-04-09: 40 mg via INTRAVENOUS
  Filled 2020-04-09: qty 40

## 2020-04-09 MED ORDER — ACETAMINOPHEN 325 MG PO TABS
650.0000 mg | ORAL_TABLET | Freq: Four times a day (QID) | ORAL | Status: DC | PRN
  Administered 2020-04-10: 650 mg via ORAL
  Filled 2020-04-09: qty 2

## 2020-04-09 MED ORDER — SODIUM CHLORIDE 0.9 % IV BOLUS
1000.0000 mL | Freq: Once | INTRAVENOUS | Status: AC
  Administered 2020-04-09: 1000 mL via INTRAVENOUS

## 2020-04-09 MED ORDER — RISAQUAD PO CAPS
1.0000 | ORAL_CAPSULE | Freq: Every day | ORAL | Status: DC
  Filled 2020-04-09 (×2): qty 1

## 2020-04-09 MED ORDER — SODIUM CHLORIDE 0.9 % IV SOLN: INTRAVENOUS | Status: DC

## 2020-04-09 MED ORDER — SODIUM CHLORIDE 0.9 % IV SOLN
8.0000 mg/h | INTRAVENOUS | Status: DC
  Administered 2020-04-09 – 2020-04-10 (×3): 8 mg/h via INTRAVENOUS
  Filled 2020-04-09 (×3): qty 80

## 2020-04-09 MED ORDER — PANTOPRAZOLE SODIUM 40 MG IV SOLR: 40.0000 mg | Freq: Two times a day (BID) | INTRAVENOUS | Status: DC

## 2020-04-09 MED ORDER — ADULT MULTIVITAMIN W/MINERALS CH
1.0000 | ORAL_TABLET | Freq: Every day | ORAL | Status: DC
  Filled 2020-04-09: qty 1

## 2020-04-09 MED ORDER — HYDROMORPHONE HCL 1 MG/ML IJ SOLN
0.5000 mg | INTRAMUSCULAR | Status: DC | PRN
  Administered 2020-04-09: 0.5 mg via INTRAVENOUS
  Filled 2020-04-09 (×2): qty 1

## 2020-04-09 MED ORDER — MORPHINE SULFATE (PF) 4 MG/ML IV SOLN
4.0000 mg | INTRAVENOUS | Status: DC | PRN
  Administered 2020-04-09: 4 mg via INTRAVENOUS
  Filled 2020-04-09: qty 1

## 2020-04-09 MED ORDER — HYDROMORPHONE HCL 1 MG/ML IJ SOLN
1.0000 mg | INTRAMUSCULAR | Status: DC | PRN
  Administered 2020-04-09 – 2020-04-10 (×2): 1 mg via INTRAVENOUS
  Filled 2020-04-09 (×2): qty 1

## 2020-04-09 MED ORDER — MORPHINE SULFATE (PF) 2 MG/ML IV SOLN: 2.0000 mg | INTRAVENOUS | Status: DC | PRN

## 2020-04-09 MED ORDER — HYDROMORPHONE HCL 1 MG/ML IJ SOLN
INTRAMUSCULAR | Status: AC
  Administered 2020-04-09: 1 mg via INTRAVENOUS
  Filled 2020-04-09: qty 1

## 2020-04-09 MED ORDER — IOHEXOL 350 MG/ML SOLN
100.0000 mL | Freq: Once | INTRAVENOUS | Status: AC | PRN
  Administered 2020-04-09: 100 mL via INTRAVENOUS

## 2020-04-09 MED ORDER — ONDANSETRON HCL 4 MG/2ML IJ SOLN: 4.0000 mg | Freq: Three times a day (TID) | INTRAMUSCULAR | Status: DC | PRN

## 2020-04-09 MED ORDER — ONDANSETRON HCL 4 MG/2ML IJ SOLN
4.0000 mg | Freq: Once | INTRAMUSCULAR | Status: AC
  Administered 2020-04-09: 4 mg via INTRAVENOUS
  Filled 2020-04-09: qty 2

## 2020-04-09 NOTE — ED Notes (Addendum)
Attempted IV twice. No success. RN Nira Conn to attempt.

## 2020-04-09 NOTE — ED Notes (Signed)
Lab at bedside

## 2020-04-09 NOTE — ED Notes (Signed)
Messaged admit MD regarding pain medication orders for pt.

## 2020-04-09 NOTE — ED Notes (Signed)
Pt given saltines ok per EDP

## 2020-04-09 NOTE — ED Triage Notes (Signed)
Patient reports symptoms began at 2 am.  Reports abdominal pain with nausea, vomiting and diarrhea.

## 2020-04-09 NOTE — H&P (Signed)
History and Physical    Charles Collins AST:[id number] DOB: [birth year] DOA: 04/09/2020  Referring MD/NP/PA:   PCP: Tonia Ghent, MD   Patient coming from:  The patient is coming from home.  At baseline, pt is independent for most of ADL.        Chief Complaint: Nausea, vomiting, diarrhea, abdominal pain, black tarry stool and melena  HPI: Charles Collins is a 64 y.o. male with medical history significant of hyperlipidemia, melanoma, diverticulosis, who presents with Nausea, vomiting, diarrhea, abdominal pain, black tarry stool and melena.  His symptoms started in the early morning at about 2 AM, including, nausea, vomiting, diarrhea and abdominal pain. He has black tarry stool and melena.  Patient states that he has had at least 4 times of nonbilious nonbloody vomiting.  He has had 4 times of watery diarrhea.  The abdominal pain is located in epigastric area, severe, sharp, nonradiating.  Patient has mild cough, but no chest pain, shortness of breath, fever or chills.  Denies symptoms of UTI.  Patient states that he is taking BC Goody powder and ibuprofen for pain   ED Course: pt was found to have hemoglobin 15.2, positive FOBT, troponin III, pending COVID-19 PCR, negative urinalysis, lipase 28, electrolytes renal function okay, temperature normal, blood pressure 126/67, bradycardia, RR 22, oxygen saturation 99% on room air.  Patient is placed on MedSurg bed for observation. Dr. Marius Ditch of GI is consulted  CTA showed: 1. No abdominal aortic aneurysm or dissection. 2. No significant mesenteric arterial stenosis. 3. 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 4. No acute abdominal/pelvic findings, mass lesions or lymphadenopathy. 5. Diffuse colonic diverticulosis without findings to suggest acute diverticulitis. 6. Emphysematous changes and peripheral pulmonary scarring possibly reflecting interstitial lung disease. 7. Bilateral inguinal hernias  containing fat. Suspect bilateral varicoceles.   Review of Systems:   General: no fevers, chills, no body weight gain, has poor appetite, has fatigue HEENT: no blurry vision, hearing changes or sore throat Respiratory: no dyspnea, has coughing, no wheezing CV: no chest pain, no palpitations GI: has nausea, vomiting, abdominal pain, diarrhea, no constipation GU: no dysuria, burning on urination, increased urinary frequency, hematuria  Ext: no leg edema Neuro: no unilateral weakness, numbness, or tingling, no vision change or hearing loss Skin: no rash, no skin tear. MSK: No muscle spasm, no deformity, no limitation of range of movement in spin Heme: No easy bruising.  Travel history: No recent long distant travel.  Allergy:  Allergies  Allergen Reactions  . Prilosec [Omeprazole] Other (See Comments)    Not an allergy, but "didn't feel well taking it"     Past Medical History:  Diagnosis Date  . Cancer ([place]) 11/2004   Melanoma, R Knee by Dr Nehemiah Massed  . Diverticulosis    DIVERTICULOSIS ISCHEMIC COLITIS VIA COLONOSCOPY(DR. SEIGAL):(01/2001)  . Hx of dysplastic nevus 06/27/2010   L dorsum mid lat. foot    Past Surgical History:  Procedure Laterality Date  . APPENDECTOMY     As Teen  . KNEE SURGERY     Open Repair Right as Teen  . MELANOMA EXCISION Right 2006   Knee. Clark's level IV, Breslow's 0.12mm    Social History:  reports that he quit smoking about 51 years ago. He has a 0.04 pack-year smoking history. He has never used smokeless tobacco. He reports that he does not drink alcohol or use drugs.  Family History:  Family History  Problem Relation Age of Onset  .  Stroke Mother 100       Cause of Death, Hemmorhage  . Diabetes Father   . Hypertension Father   . Obesity Father   . Kidney disease Father   . Kidney cancer Father   . Pancreatic cancer Father        presumed, no biopsy done  . Hypertension Paternal Grandfather   . Heart disease Neg Hx   . Cancer Neg  Hx   . Depression Neg Hx   . Drug abuse Neg Hx   . Alcohol abuse Neg Hx   . Colon cancer Neg Hx   . Prostate cancer Neg Hx      Prior to Admission medications   Medication Sig Start Date End Date Taking? Authorizing Provider  atorvastatin (LIPITOR) 10 MG tablet Take 1 tablet (10 mg total) by mouth daily. 04/14/19   Tonia Ghent, MD  diclofenac (VOLTAREN) 75 MG EC tablet Take 1 tablet (75 mg total) by mouth 2 (two) times daily. 02/10/20   Edrick Kins, DPM  Multiple Vitamin (MULTIVITAMIN) tablet Take 1 tablet by mouth daily.    [provider]  predniSONE (DELTASONE) 10 MG tablet Take 2 a day for 5 days, then 1 a day for 5 days, with food. Don't take with aleve/ibuprofen. 05/23/19   Tonia Ghent, MD  tizanidine (ZANAFLEX) 2 MG capsule Take 1 capsule (2 mg total) by mouth 3 (three) times daily as needed for muscle spasms (sedation caution.). 05/23/19   Tonia Ghent, MD    Physical Exam: Vitals:   04/09/20 1645 04/09/20 1700 04/09/20 1715 04/09/20 1730  BP:  131/81  (!) 155/79  Pulse:   (!) 59 68  Resp: (!) 23 20 (!) 21 19  Temp:      TempSrc:      SpO2:   98% 100%  Weight:      Height:       General: Not in acute distress HEENT:       Eyes: PERRL, EOMI, no scleral icterus.       ENT: No discharge from the ears and nose, no pharynx injection, no tonsillar enlargement.        Neck: No JVD, no bruit, no mass felt. Heme: No neck lymph node enlargement. Cardiac: S1/S2, RRR, No murmurs, No gallops or rubs. Respiratory: No rales, wheezing, rhonchi or rubs. GI: Soft, nondistended, has tenderness in epigastric area, no rebound pain, no organomegaly, BS present. GU: No hematuria Ext: No pitting leg edema bilaterally. 2+DP/PT pulse bilaterally. Musculoskeletal: No joint deformities, No joint redness or warmth, no limitation of ROM in spin. Skin: No rashes.  Neuro: Alert, oriented X3, cranial nerves II-XII grossly intact, moves all extremities normally. Psych: Patient  is not psychotic, no suicidal or hemocidal ideation.  Labs on Admission: I have personally reviewed following labs and imaging studies  CBC: Recent Labs  Lab 04/09/20 0640 04/09/20 1415  WBC 7.6 10.0  HGB 15.2 15.3  HCT 44.5 44.4  MCV 89.9 89.7  PLT 220 102   Basic Metabolic Panel: Recent Labs  Lab 04/09/20 0640  NA 138  K 4.7  CL 103  CO2 26  GLUCOSE 124*  BUN 13  CREATININE 1.01  CALCIUM 9.4   GFR: Estimated Creatinine Clearance: 79 mL/min (by C-G formula based on SCr of 1.01 mg/dL). Liver Function Tests: Recent Labs  Lab 04/09/20 0640  AST 27  ALT 28  ALKPHOS 70  BILITOT 0.6  PROT 7.0  ALBUMIN 4.0   Recent Labs  Lab 04/09/20 0640  LIPASE 28   No results for input(s): AMMONIA in the last 168 hours. Coagulation Profile: Recent Labs  Lab 04/09/20 1415  INR 1.0   Cardiac Enzymes: No results for input(s): CKTOTAL, CKMB, CKMBINDEX, TROPONINI in the last 168 hours. BNP (last 3 results) No results for input(s): PROBNP in the last 8760 hours. HbA1C: No results for input(s): HGBA1C in the last 72 hours. CBG: No results for input(s): GLUCAP in the last 168 hours. Lipid Profile: No results for input(s): CHOL, HDL, LDLCALC, TRIG, CHOLHDL, LDLDIRECT in the last 72 hours. Thyroid Function Tests: No results for input(s): TSH, T4TOTAL, FREET4, T3FREE, THYROIDAB in the last 72 hours. Anemia Panel: No results for input(s): VITAMINB12, FOLATE, FERRITIN, TIBC, IRON, RETICCTPCT in the last 72 hours. Urine analysis:    Component Value Date/Time   COLORURINE YELLOW (A) 04/09/2020 0640   APPEARANCEUR CLEAR (A) 04/09/2020 0640   LABSPEC 1.015 04/09/2020 0640   PHURINE 5.0 04/09/2020 0640   GLUCOSEU NEGATIVE 04/09/2020 0640   HGBUR NEGATIVE 04/09/2020 0640   BILIRUBINUR NEGATIVE 04/09/2020 0640   KETONESUR NEGATIVE 04/09/2020 0640   PROTEINUR NEGATIVE 04/09/2020 0640   NITRITE NEGATIVE 04/09/2020 0640   LEUKOCYTESUR NEGATIVE 04/09/2020 0640   Sepsis  Labs: @LABRCNTIP (procalcitonin:4,lacticidven:4) ) Recent Results (from the past 240 hour(s))  SARS Coronavirus 2 by RT PCR (hospital order, performed in [facility] hospital lab) Nasopharyngeal Nasopharyngeal Swab     Status: None   Collection Time: 04/09/20  1:12 PM   Specimen: Nasopharyngeal Swab  Result Value Ref Range Status   SARS Coronavirus 2 NEGATIVE NEGATIVE Final    Comment: (NOTE) SARS-CoV-2 target nucleic acids are NOT DETECTED. The SARS-CoV-2 RNA is generally detectable in upper and lower respiratory specimens during the acute phase of infection. The lowest concentration of SARS-CoV-2 viral copies this assay can detect is 250 copies / mL. A negative result does not preclude SARS-CoV-2 infection and should not be used as the sole basis for treatment or other patient management decisions.  A negative result may occur with improper specimen collection / handling, submission of specimen other than nasopharyngeal swab, presence of viral mutation(s) within the areas targeted by this assay, and inadequate number of viral copies (<250 copies / mL). A negative result must be combined with clinical observations, patient history, and epidemiological information. Fact Sheet for Patients:   StrictlyIdeas.no Fact Sheet for Healthcare Providers: BankingDealers.co.za This test is not yet approved or cleared  by the [place] FDA and has been authorized for detection and/or diagnosis of SARS-CoV-2 by FDA under an Emergency Use Authorization (EUA).  This EUA will remain in effect (meaning this test can be used) for the duration of the COVID-19 declaration under Section 564(b)(1) of the Act, 21 U.S.C. section 360bbb-3(b)(1), unless the authorization is terminated or revoked sooner. Performed at [facility], [address] [place],  [address]      Radiological Exams on Admission: CT Angio Abd/Pel W and/or Wo  Contrast  Result Date: 04/09/2020 CLINICAL DATA:  Acute epigastric abdominal pain with nausea and vomiting. History of ischemic colitis. EXAM: CTA ABDOMEN AND PELVIS WITHOUT AND WITH CONTRAST TECHNIQUE: Multidetector CT imaging of the abdomen and pelvis was performed using the standard protocol during bolus administration of intravenous contrast. Multiplanar reconstructed images and MIPs were obtained and reviewed to evaluate the vascular anatomy. CONTRAST:  150mL OMNIPAQUE IOHEXOL 350 MG/ML SOLN COMPARISON:  None. FINDINGS: VASCULAR Aorta: Normal caliber abdominal aorta. No aneurysm or dissection. Minimal distal atherosclerotic calcification. Celiac:  No ostial stenosis. 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 Maximum measuring is 10.5 mm. No atherosclerotic calcifications. SMA: Normal Renals: Normal IMA: Normal Inflow: Normal Proximal Outflow: Normal Veins: Unremarkable Review of the MIP images confirms the above findings. NON-VASCULAR Lower chest: Emphysematous changes are noted with peripheral pulmonary scarring possibly reflecting interstitial lung disease. No infiltrates or effusions. The heart is normal in size. No pericardial effusion. The lower thoracic aorta is unremarkable. No dissection. Hepatobiliary: No hepatic lesions are identified. Slight contour abnormality of the, slightly dilated hepatic fissures and slight increased size of the caudate lobe could suggest early changes of cirrhosis. Recommend clinical correlation. The gallbladder is unremarkable.  No common bile duct dilatation. Pancreas: No mass, inflammation or ductal dilatation. Spleen: Normal size.  No focal lesions. Adrenals/Urinary Tract: The adrenal glands are unremarkable. Simple right renal cyst. No worrisome renal lesions or hydronephrosis. The bladder is unremarkable. Stomach/Bowel: The stomach, duodenum, small bowel and colon are unremarkable. No acute inflammatory changes, mass lesions or obstructive findings.  The terminal ileum is normal. The appendix is not identified for certain but I do not see any findings to suggest appendicitis. Significant and diffuse diverticulosis most notably involving the descending colon and sigmoid colon. No findings to suggest acute diverticulitis. Lymphatic: No mesenteric or retroperitoneal mass or adenopathy. Reproductive: The prostate gland and seminal vesicles are unremarkable. Other: No pelvic mass or adenopathy. No free pelvic fluid collections. Bilateral inguinal hernias containing fat. Suspect bilateral varicoceles. Musculoskeletal: No significant bony findings. Moderate degenerative disc disease at L4-5. Moderate age advanced bilateral hip joint degenerative changes. IMPRESSION: 1. No abdominal aortic aneurysm or dissection. 2. No significant mesenteric arterial stenosis. 3. 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 4. No acute abdominal/pelvic findings, mass lesions or lymphadenopathy. 5. Diffuse colonic diverticulosis without findings to suggest acute diverticulitis. 6. Emphysematous changes and peripheral pulmonary scarring possibly reflecting interstitial lung disease. 7. Bilateral inguinal hernias containing fat. Suspect bilateral varicoceles. Emphysema (ICD10-J43.9). Electronically Signed   By: Marijo Sanes M.D.   On: 04/09/2020 11:34     EKG: Independently reviewed.  Sinus rhythm, bradycardia, QTC 413, early R wave progression  Assessment/Plan Principal Problem:   Melena Active Problems:   Nausea vomiting and diarrhea   Abdominal pain   HLD (hyperlipidemia)   Melena and nausea vomiting and diarrhea and abdominal pain:  hgb 15.2. CT angio abdomen and pelvis was unremarkable for any acute issues.  Dr. Marius Ditch of GI is consulted -->EGD in AM  - Placed on MedSurg bed for observation - GI consulted by Ed, will follow up recommendations - NPO for possible EGD - IVF: 1L NS bolus, then at 100 mL/hr - Start IV pantoprazole gtt -  Zofran IV for nausea - Avoid NSAIDs and SQ heparin - Maintain IV access (2 large bore IVs if possible). - Monitor closely and follow q6h cbc, transfuse as necessary, if Hgb<7.0 - LaB: INR, PTT and type screen  HLD (hyperlipidemia): Patient is not taking stenting at home currently -f/u with PCP    DVT ppx: SCD Code Status: Full code Family Communication: not done, no family member is at bed side.     Disposition Plan:  Anticipate discharge back to previous environment Consults called:  Dr. Marius Ditch of GI Admission status: Med-surg bed for obs       Status is: Observation  The patient remains OBS appropriate and will d/c before 2 midnights.  Dispo: The patient is from: Home  Anticipated d/c is to: Home              Anticipated d/c date is: 1 day              Patient currently is not medically stable to d/c.           Date of Service 04/09/2020    Ivor Costa [facility]   If 7PM-7AM, please contact night-coverage [email or web address] 04/09/2020, 6:18 PM

## 2020-04-09 NOTE — ED Notes (Signed)
Pt and family updated at this time. Informed pt we are just waiting on results from scan.  Pt states medication helped. Pt denies any other needs at this time

## 2020-04-09 NOTE — ED Provider Notes (Signed)
[facility] Emergency Department Provider Note    First MD Initiated Contact with Patient 04/09/20 [phone]     (approximate)  I have reviewed the triage vital signs and the nursing notes.   HISTORY  Chief Complaint Abdominal Pain    HPI Charles Collins is a 64 y.o. male with the below listed past medical history presents to the ER for sudden onset epigastric pain associate with nausea vomiting diarrhea.  States the pain is constant stabbing midepigastric area.  Has not had any fevers.  Ate [employer] for dinner last night.  No other sick contacts.  Is never had pain like this before.  Reportedly does have a history of diverticulitis as well as ischemic colitis by colonoscopy.  Denies any back pain.  Denies any flank pain or dysuria.    Past Medical History:  Diagnosis Date  . Cancer ([place]) 11/2004   Melanoma, R Knee by Dr Nehemiah Massed  . Diverticulosis    DIVERTICULOSIS ISCHEMIC COLITIS VIA COLONOSCOPY(DR. SEIGAL):(01/2001)  . Hx of dysplastic nevus 06/27/2010   L dorsum mid lat. foot   Family History  Problem Relation Age of Onset  . Stroke Mother 48       Cause of Death, Hemmorhage  . Diabetes Father   . Hypertension Father   . Obesity Father   . Kidney disease Father   . Kidney cancer Father   . Pancreatic cancer Father        presumed, no biopsy done  . Hypertension Paternal Grandfather   . Heart disease Neg Hx   . Cancer Neg Hx   . Depression Neg Hx   . Drug abuse Neg Hx   . Alcohol abuse Neg Hx   . Colon cancer Neg Hx   . Prostate cancer Neg Hx    Past Surgical History:  Procedure Laterality Date  . APPENDECTOMY     As Teen  . KNEE SURGERY     Open Repair Right as Teen  . MELANOMA EXCISION Right 2006   Knee. Clark's level IV, Breslow's 0.58mm   Patient Active Problem List   Diagnosis Date Noted  . Nausea vomiting and diarrhea 04/09/2020  . Cough 09/15/2018  . Sciatica of left side 09/06/2015  . Advance care planning 03/27/2015    . Lower back pain 11/06/2014  . Dysphagia 01/05/2013  . Routine general medical examination at a health care facility 03/18/2012  . DIVERTICULAR DISEASE 12/20/2007  . HYPERCHOLESTEROLEMIA 12/17/2007  . MELANOMA, HX OF 12/17/2007      Prior to Admission medications   Medication Sig Start Date End Date Taking? Authorizing Provider  atorvastatin (LIPITOR) 10 MG tablet Take 1 tablet (10 mg total) by mouth daily. 04/14/19   Tonia Ghent, MD  diclofenac (VOLTAREN) 75 MG EC tablet Take 1 tablet (75 mg total) by mouth 2 (two) times daily. 02/10/20   Edrick Kins, DPM  Multiple Vitamin (MULTIVITAMIN) tablet Take 1 tablet by mouth daily.    [provider]  predniSONE (DELTASONE) 10 MG tablet Take 2 a day for 5 days, then 1 a day for 5 days, with food. Don't take with aleve/ibuprofen. 05/23/19   Tonia Ghent, MD  tizanidine (ZANAFLEX) 2 MG capsule Take 1 capsule (2 mg total) by mouth 3 (three) times daily as needed for muscle spasms (sedation caution.). 05/23/19   Tonia Ghent, MD    Allergies Prilosec [omeprazole]    Social History Social History   Tobacco Use  . Smoking status:  Former Smoker    Packs/day: 0.02    Years: 2.00    Pack years: 60.04    Quit date: 11/03/1968    Years since quitting: 51.4  . Smokeless tobacco: Never Used  . Tobacco comment: quit over 30 years ago, smoked briefly as a teenager  Substance Use Topics  . Alcohol use: No    Alcohol/week: 0.0 standard drinks  . Drug use: No    Review of Systems Patient denies headaches, rhinorrhea, blurry vision, numbness, shortness of breath, chest pain, edema, cough, abdominal pain, nausea, vomiting, diarrhea, dysuria, fevers, rashes or hallucinations unless otherwise stated above in HPI. ____________________________________________   PHYSICAL EXAM:  VITAL SIGNS: Vitals:   04/09/20 1145 04/09/20 1200  BP:  126/67  Pulse: (!) 47 (!) 50  Resp: 20 (!) 21  Temp:    SpO2: 99% 99%     Constitutional: Alert and oriented.  Eyes: Conjunctivae are normal.  Head: Atraumatic. Nose: No congestion/rhinnorhea. Mouth/Throat: Mucous membranes are moist.   Neck: No stridor. Painless ROM.  Cardiovascular: Normal rate, regular rhythm. Grossly normal heart sounds.  Good peripheral circulation. Respiratory: Normal respiratory effort.  No retractions. Lungs CTAB. Gastrointestinal: Soft with mild epigastric ttp. No distention. No abdominal bruits. No CVA tenderness. Genitourinary: Guaiac positive dark stool.  No blood on DRE Musculoskeletal: No lower extremity tenderness nor edema.  No joint effusions. Neurologic:  Normal speech and language. No gross focal neurologic deficits are appreciated. No facial droop Skin:  Skin is warm, dry and intact. No rash noted. Psychiatric: Mood and affect are normal. Speech and behavior are normal.  ____________________________________________   LABS (all labs ordered are listed, but only abnormal results are displayed)  Results for orders placed or performed during the hospital encounter of 04/09/20 (from the past 24 hour(s))  Lipase, blood     Status: None   Collection Time: 04/09/20  6:40 AM  Result Value Ref Range   Lipase 28 11 - 51 U/L  Comprehensive metabolic panel     Status: Abnormal   Collection Time: 04/09/20  6:40 AM  Result Value Ref Range   Sodium 138 135 - 145 mmol/L   Potassium 4.7 3.5 - 5.1 mmol/L   Chloride 103 98 - 111 mmol/L   CO2 26 22 - 32 mmol/L   Glucose, Bld 124 (H) 70 - 99 mg/dL   BUN 13 8 - 23 mg/dL   Creatinine, Ser 1.01 0.61 - 1.24 mg/dL   Calcium 9.4 8.9 - 10.3 mg/dL   Total Protein 7.0 6.5 - 8.1 g/dL   Albumin 4.0 3.5 - 5.0 g/dL   AST 27 15 - 41 U/L   ALT 28 0 - 44 U/L   Alkaline Phosphatase 70 38 - 126 U/L   Total Bilirubin 0.6 0.3 - 1.2 mg/dL   GFR calc non Af Amer >60 >60 mL/min   GFR calc Af Amer >60 >60 mL/min   Anion gap 9 5 - 15  CBC     Status: None   Collection Time: 04/09/20  6:40 AM   Result Value Ref Range   WBC 7.6 4.0 - 10.5 K/uL   RBC 4.95 4.22 - 5.81 MIL/uL   Hemoglobin 15.2 13.0 - 17.0 g/dL   HCT 44.5 39.0 - 52.0 %   MCV 89.9 80.0 - 100.0 fL   MCH 30.7 26.0 - 34.0 pg   MCHC 34.2 30.0 - 36.0 g/dL   RDW 12.7 11.5 - 15.5 %   Platelets 220 150 - 400 K/uL  nRBC 0.0 0.0 - 0.2 %  Urinalysis, Complete w Microscopic     Status: Abnormal   Collection Time: 04/09/20  6:40 AM  Result Value Ref Range   Color, Urine YELLOW (A) YELLOW   APPearance CLEAR (A) CLEAR   Specific Gravity, Urine 1.015 1.005 - 1.030   pH 5.0 5.0 - 8.0   Glucose, UA NEGATIVE NEGATIVE mg/dL   Hgb urine dipstick NEGATIVE NEGATIVE   Bilirubin Urine NEGATIVE NEGATIVE   Ketones, ur NEGATIVE NEGATIVE mg/dL   Protein, ur NEGATIVE NEGATIVE mg/dL   Nitrite NEGATIVE NEGATIVE   Leukocytes,Ua NEGATIVE NEGATIVE   RBC / HPF 0-5 0 - 5 RBC/hpf   WBC, UA 0-5 0 - 5 WBC/hpf   Bacteria, UA NONE SEEN NONE SEEN   Squamous Epithelial / LPF NONE SEEN 0 - 5   Mucus PRESENT   Troponin I (High Sensitivity)     Status: None   Collection Time: 04/09/20  6:40 AM  Result Value Ref Range   Troponin I (High Sensitivity) 3 <18 ng/L   ____________________________________________  EKG My review and personal interpretation at Time: 7:55   Indication: abd pain  Rate: 50  Rhythm: sinus Axis: normal Other: normal intervals, non specific st abn ____________________________________________  RADIOLOGY  I personally reviewed all radiographic images ordered to evaluate for the above acute complaints and reviewed radiology reports and findings.  These findings were personally discussed with the patient.  Please see medical record for radiology report.  ____________________________________________   PROCEDURES  Procedure(s) performed:  Procedures    Critical Care performed: no ____________________________________________   INITIAL IMPRESSION / ASSESSMENT AND PLAN / ED COURSE  Pertinent labs & imaging results  that were available during my care of the patient were reviewed by me and considered in my medical decision making (see chart for details).   DDX: colitis, mesenteric ischemia, enteritis, gastritis, pancreatitis, cholelithiasis  Charles Collins is a 64 y.o. who presents to the ED with symptoms as described above.  Given patient's history and sudden onset abdominal pain will order blood work as well as IV pain medication IV antiemetic IV fluids as well as CT angiogram to evaluate for the above differential.  Clinical Course as of Apr 09 1340  Mon Apr 09, 2020  1214 Patient remains well nontoxic appearing.  States he will try to provide a stool sample to evaluate for any evidence of melena or guaiac positivity.  I discussed the results of CTA with Dr. Delana Meyer states no indication for intervention at this time I do not believe today's presentation is related to this finding but he will need close outpatient follow-up.   [PR]  1322 Stool is guaiac positive.  He remains hemodynamically stable but he does endorse using BC Goody powder and Motrin.  As he is having melena will discuss with hospitalist for admission for serial hemoglobins and monitoring.  I have ordered IV Protonix.   [PR]    Clinical Course User Index [PR] Merlyn Lot, MD    The patient was evaluated in Emergency Department today for the symptoms described in the history of present illness. He/she was evaluated in the context of the global COVID-19 pandemic, which necessitated consideration that the patient might be at risk for infection with the SARS-CoV-2 virus that causes COVID-19. Institutional protocols and algorithms that pertain to the evaluation of patients at risk for COVID-19 are in a state of rapid change based on information released by regulatory bodies including the CDC and federal and state organizations.  These policies and algorithms were followed during the patient's care in the ED.  As part of my medical  decision making, I reviewed the following data within the [facility] notes reviewed and incorporated, Labs reviewed, notes from prior ED visits and [place] Controlled Substance Database   ____________________________________________   FINAL CLINICAL IMPRESSION(S) / ED DIAGNOSES  Final diagnoses:  Epigastric pain  Melena      NEW MEDICATIONS STARTED DURING THIS VISIT:  New Prescriptions   No medications on file     Note:  This document was prepared using Dragon voice recognition software and may include unintentional dictation errors.    Merlyn Lot, MD 04/09/20 1341

## 2020-04-09 NOTE — ED Notes (Signed)
Lab recollected type and screen. Lab stated other tubes were ok and did not draw them.

## 2020-04-09 NOTE — Consult Note (Signed)
Charles Darby, MD [address]  [place]  [place], [place] [address]  Main: [phone]  Fax: [phone] Pager: [phone]   Consultation  Referring Provider:     No ref. provider found Primary Care Physician:  Tonia Ghent, MD Primary Gastroenterologist: Althia Forts         Reason for Consultation:     Epigastric pain, melena  Date of Admission:  04/09/2020 Date of Consultation:  04/09/2020         HPI:   Charles Collins is a 64 y.o. male with no significant past medical history presented to ER due to acute acute onset of epigastric pain associated with nausea, vomiting and diarrhea, followed by black tarry bowel movement.  Patient reports that symptoms began at 2 am, patient reports intake of BC powder use about once a week.  Patient ate [employer] for dinner last night.  Denies any other sick contacts.  Reported history of diverticulitis as well as ischemic colitis based on colonoscopy in 2002.  Upon arrival to ER, his vitals were stable, labs revealed normal lipase, CMP, CBC, troponin.  Subsequently, underwent CT angio abdomen and pelvis which did not reveal any acute intra-abdominal pathology. Patient received Dilaudid in the ER with some relief  Patient's wife is bedside Patient denies smoking or alcohol use  NSAIDs: BC powder use for arthritis pain, diclofenac 75 mg  Antiplts/Anticoagulants/Anti thrombotics: None  GI Procedures: Colonoscopy approximately 5 years ago, known history of diverticulosis  Past Medical History:  Diagnosis Date  . Cancer ([place]) 11/2004   Melanoma, R Knee by Dr Nehemiah Massed  . Diverticulosis    DIVERTICULOSIS ISCHEMIC COLITIS VIA COLONOSCOPY(DR. SEIGAL):(01/2001)  . Hx of dysplastic nevus 06/27/2010   L dorsum mid lat. foot    Past Surgical History:  Procedure Laterality Date  . APPENDECTOMY     As Teen  . KNEE SURGERY     Open Repair Right as Teen  . MELANOMA EXCISION Right 2006   Knee. Clark's level IV, Breslow's 0.60m     Prior to Admission medications   Medication Sig Start Date End Date Taking? Authorizing Provider  acidophilus (RISAQUAD) CAPS capsule Take 1 capsule by mouth daily.   Yes [provider]  diclofenac (VOLTAREN) 75 MG EC tablet Take 1 tablet (75 mg total) by mouth 2 (two) times daily. 02/10/20  Yes EEdrick Kins DPM  Multiple Vitamin (MULTIVITAMIN) tablet Take 1 tablet by mouth daily.   Yes [provider]   Current Facility-Administered Medications:  .  0.9 %  sodium chloride infusion, , Intravenous, Continuous, NIvor Costa MD, Last Rate: 100 mL/hr at 04/09/20 1427, New Bag at 04/09/20 1427 .  acetaminophen (TYLENOL) tablet 650 mg, 650 mg, Oral, Q6H PRN, NIvor Costa MD .  morphine 2 MG/ML injection 2 mg, 2 mg, Intravenous, Q4H PRN, NIvor Costa MD .  ondansetron (ZOFRAN) injection 4 mg, 4 mg, Intravenous, Q8H PRN, NIvor Costa MD .  pantoprazole (PROTONIX) 80 mg in sodium chloride 0.9 % 100 mL (0.8 mg/mL) infusion, 8 mg/hr, Intravenous, Continuous, NIvor Costa MD, Last Rate: 10 mL/hr at 04/09/20 1458, 8 mg/hr at 04/09/20 1458 .  [START ON 04/13/2020] pantoprazole (PROTONIX) injection 40 mg, 40 mg, Intravenous, Q12H, NIvor Costa MD  Current Outpatient Medications:  .  acidophilus (RISAQUAD) CAPS capsule, Take 1 capsule by mouth daily., Disp: , Rfl:  .  diclofenac (VOLTAREN) 75 MG EC tablet, Take 1 tablet (75 mg total) by mouth 2 (two) times daily., Disp:  60 tablet, Rfl: 1 .  Multiple Vitamin (MULTIVITAMIN) tablet, Take 1 tablet by mouth daily., Disp: , Rfl:    Family History  Problem Relation Age of Onset  . Stroke Mother 10       Cause of Death, Hemmorhage  . Diabetes Father   . Hypertension Father   . Obesity Father   . Kidney disease Father   . Kidney cancer Father   . Pancreatic cancer Father        presumed, no biopsy done  . Hypertension Paternal Grandfather   . Heart disease Neg Hx   . Cancer Neg Hx   . Depression Neg Hx   . Drug abuse Neg Hx   . Alcohol  abuse Neg Hx   . Colon cancer Neg Hx   . Prostate cancer Neg Hx      Social History   Tobacco Use  . Smoking status: Former Smoker    Packs/day: 0.02    Years: 2.00    Pack years: 4.04    Quit date: 11/03/1968    Years since quitting: 51.4  . Smokeless tobacco: Never Used  . Tobacco comment: quit over 30 years ago, smoked briefly as a teenager  Substance Use Topics  . Alcohol use: No    Alcohol/week: 0.0 standard drinks  . Drug use: No    Allergies as of 04/09/2020 - Review Complete 04/09/2020  Allergen Reaction Noted  . Prilosec [omeprazole] Other (See Comments) 01/05/2013    Review of Systems:    All systems reviewed and negative except where noted in HPI.   Physical Exam:  Vital signs in last 24 hours: Temp:  [98.4 F (36.9 C)] 98.4 F (36.9 C) (06/07 0856) Pulse Rate:  [47-76] 68 (06/07 1600) Resp:  [11-22] 22 (06/07 1600) BP: (111-172)/(67-82) 111/67 (06/07 1600) SpO2:  [96 %-100 %] 96 % (06/07 1600) Weight:  [83.9 kg] 83.9 kg (06/07 0636)   General:   Pleasant, cooperative in NAD Head:  Normocephalic and atraumatic. Eyes:   No icterus.   Conjunctiva pink. PERRLA. Ears:  Normal auditory acuity. Neck:  Supple; no masses or thyroidomegaly Lungs: Respirations even and unlabored. Lungs clear to auscultation bilaterally.   No wheezes, crackles, or rhonchi.  Heart:  Regular rate and rhythm;  Without murmur, clicks, rubs or gallops Abdomen:  Soft, nondistended, nontender. Normal bowel sounds. No appreciable masses or hepatomegaly.  No rebound or guarding.  Rectal:  Not performed. Msk:  Symmetrical without gross deformities.  Strength normal Extremities:  Without edema, cyanosis or clubbing. Neurologic:  Alert and oriented x3;  grossly normal neurologically. Skin:  Intact without significant lesions or rashes. Psych:  Alert and cooperative. Normal affect.  LAB RESULTS: CBC Latest Ref Rng & Units 04/09/2020 04/09/2020 03/03/2011  WBC 4.0 - 10.5 K/uL 10.0 7.6 5.3    Hemoglobin 13.0 - 17.0 g/dL 15.3 15.2 14.6  Hematocrit 39.0 - 52.0 % 44.4 44.5 42.4  Platelets 150 - 400 K/uL 191 220 216.0    BMET BMP Latest Ref Rng & Units 04/09/2020 04/07/2019 04/07/2018  Glucose 70 - 99 mg/dL 124(H) 94 105(H)  BUN 8 - 23 mg/dL _0 Creatinine 0.61 - 1.24 mg/dL 1.01 1.02 0.96  Sodium 135 - 145 mmol/L 138 139 138  Potassium 3.5 - 5.1 mmol/L 4.7 4.7 4.6  Chloride 98 - 111 mmol/L 103 103 103  CO2 22 - 32 mmol/L _1 Calcium 8.9 - 10.3 mg/dL 9.4 9.5 9.1    LFT Hepatic Function  Latest Ref Rng & Units 04/09/2020 04/07/2019 04/07/2018  Total Protein 6.5 - 8.1 g/dL 7.0 7.1 6.7  Albumin 3.5 - 5.0 g/dL 4.0 4.2 4.0  AST 15 - 41 U/L _0 ALT 0 - 44 U/L _1 Alk Phosphatase 38 - 126 U/L 70 62 68  Total Bilirubin 0.3 - 1.2 mg/dL 0.6 0.4 0.3  Bilirubin, Direct 0.0 - 0.3 mg/dL - - -     STUDIES: CT Angio Abd/Pel W and/or Wo Contrast  Result Date: 04/09/2020 CLINICAL DATA:  Acute epigastric abdominal pain with nausea and vomiting. History of ischemic colitis. EXAM: CTA ABDOMEN AND PELVIS WITHOUT AND WITH CONTRAST TECHNIQUE: Multidetector CT imaging of the abdomen and pelvis was performed using the standard protocol during bolus administration of intravenous contrast. Multiplanar reconstructed images and MIPs were obtained and reviewed to evaluate the vascular anatomy. CONTRAST:  120m OMNIPAQUE IOHEXOL 350 MG/ML SOLN COMPARISON:  None. FINDINGS: VASCULAR Aorta: Normal caliber abdominal aorta. No aneurysm or dissection. Minimal distal atherosclerotic calcification. Celiac: No ostial stenosis. 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 Maximum measuring is 10.5 mm. No atherosclerotic calcifications. SMA: Normal Renals: Normal IMA: Normal Inflow: Normal Proximal Outflow: Normal Veins: Unremarkable Review of the MIP images confirms the above findings. NON-VASCULAR Lower chest: Emphysematous changes are noted with peripheral pulmonary scarring possibly  reflecting interstitial lung disease. No infiltrates or effusions. The heart is normal in size. No pericardial effusion. The lower thoracic aorta is unremarkable. No dissection. Hepatobiliary: No hepatic lesions are identified. Slight contour abnormality of the, slightly dilated hepatic fissures and slight increased size of the caudate lobe could suggest early changes of cirrhosis. Recommend clinical correlation. The gallbladder is unremarkable.  No common bile duct dilatation. Pancreas: No mass, inflammation or ductal dilatation. Spleen: Normal size.  No focal lesions. Adrenals/Urinary Tract: The adrenal glands are unremarkable. Simple right renal cyst. No worrisome renal lesions or hydronephrosis. The bladder is unremarkable. Stomach/Bowel: The stomach, duodenum, small bowel and colon are unremarkable. No acute inflammatory changes, mass lesions or obstructive findings. The terminal ileum is normal. The appendix is not identified for certain but I do not see any findings to suggest appendicitis. Significant and diffuse diverticulosis most notably involving the descending colon and sigmoid colon. No findings to suggest acute diverticulitis. Lymphatic: No mesenteric or retroperitoneal mass or adenopathy. Reproductive: The prostate gland and seminal vesicles are unremarkable. Other: No pelvic mass or adenopathy. No free pelvic fluid collections. Bilateral inguinal hernias containing fat. Suspect bilateral varicoceles. Musculoskeletal: No significant bony findings. Moderate degenerative disc disease at L4-5. Moderate age advanced bilateral hip joint degenerative changes. IMPRESSION: 1. No abdominal aortic aneurysm or dissection. 2. No significant mesenteric arterial stenosis. 3. 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 4. No acute abdominal/pelvic findings, mass lesions or lymphadenopathy. 5. Diffuse colonic diverticulosis without findings to suggest acute diverticulitis. 6.  Emphysematous changes and peripheral pulmonary scarring possibly reflecting interstitial lung disease. 7. Bilateral inguinal hernias containing fat. Suspect bilateral varicoceles. Emphysema (ICD10-J43.9). Electronically Signed   By: PMarijo SanesM.D.   On: 04/09/2020 11:34      Impression / Plan:   Charles ADVANIis a 64y.o. male with no significant past medical history presented with acute episode of epigastric pain, nausea and vomiting, melena.  No evidence of acute pancreatitis or choledocholithiasis.  CT angio abdomen and pelvis was unremarkable for any acute pathology  Melena with epigastric pain, nausea and vomiting History of NSAID use, recommend upper  endoscopy for further evaluation.  If EGD is negative, patient can go home tomorrow Okay to continue pantoprazole drip Check a.m. CBC Avoid NSAID use   Thank you for involving me in the care of this patient.  GI will follow along with you    LOS: 0 days   Sherri Sear, MD  04/09/2020, 5:21 PM   Note: This dictation was prepared with Dragon dictation along with smaller phrase technology. Any transcriptional errors that result from this process are unintentional.

## 2020-04-09 NOTE — ED Notes (Signed)
Received call from lab stating type and screen was hemolyzed. Lab to send someone to recollect and will collect other tests as well if need be.

## 2020-04-09 NOTE — ED Notes (Signed)
Attempted to call report. Asked by 1A charge to call back in 15 minutes.

## 2020-04-10 ENCOUNTER — Encounter: Payer: Self-pay | Admitting: Internal Medicine

## 2020-04-10 ENCOUNTER — Encounter
Admission: EM | Disposition: A | Discharge status: Home / Self Care | Payer: Self-pay | Source: Home / Self Care | Attending: Student in an Organized Health Care Education/Training Program

## 2020-04-10 ENCOUNTER — Other Ambulatory Visit: Payer: Self-pay

## 2020-04-10 ENCOUNTER — Observation Stay: Payer: Self-pay | Admitting: Anesthesiology

## 2020-04-10 HISTORY — PX: ESOPHAGOGASTRODUODENOSCOPY (EGD) WITH PROPOFOL: SHX5813

## 2020-04-10 LAB — BASIC METABOLIC PANEL
Anion gap: 9 (ref 5–15)
BUN: 9 mg/dL (ref 8–23)
CO2: 26 mmol/L (ref 22–32)
Calcium: 8.3 mg/dL — ABNORMAL LOW (ref 8.9–10.3)
Chloride: 103 mmol/L (ref 98–111)
Creatinine, Ser: 0.9 mg/dL (ref 0.61–1.24)
GFR calc Af Amer: >60 mL/min (ref >60)
GFR calc non Af Amer: >60 mL/min (ref >60)
Glucose, Bld: 120 mg/dL — ABNORMAL HIGH (ref 70–99)
Potassium: 4.5 mmol/L (ref 3.5–5.1)
Sodium: 138 mmol/L (ref 135–145)

## 2020-04-10 LAB — CBC
HCT: 40.5 % (ref 39.0–52.0)
HCT: 39.9 % (ref 39.0–52.0)
Hemoglobin: 13.4 g/dL (ref 13.0–17.0)
Hemoglobin: 13.2 g/dL (ref 13.0–17.0)
MCH: 30.8 pg (ref 26.0–34.0)
MCH: 31 pg (ref 26.0–34.0)
MCHC: 33.1 g/dL (ref 30.0–36.0)
MCHC: 33.1 g/dL (ref 30.0–36.0)
MCV: 93.1 fL (ref 80.0–100.0)
MCV: 93.7 fL (ref 80.0–100.0)
Platelets: 175 10*3/uL (ref 150–400)
Platelets: 177 10*3/uL (ref 150–400)
RBC: 4.35 MIL/uL (ref 4.22–5.81)
RBC: 4.26 MIL/uL (ref 4.22–5.81)
RDW: 12.8 % (ref 11.5–15.5)
RDW: 13.1 % (ref 11.5–15.5)
WBC: 11.8 10*3/uL — ABNORMAL HIGH (ref 4.0–10.5)
WBC: 13.2 10*3/uL — ABNORMAL HIGH (ref 4.0–10.5)
nRBC: 0 % (ref 0.0–0.2)
nRBC: 0 % (ref 0.0–0.2)

## 2020-04-10 LAB — GLUCOSE, CAPILLARY: Glucose-Capillary: 104 mg/dL — ABNORMAL HIGH (ref 70–99)

## 2020-04-10 LAB — HIV ANTIBODY (ROUTINE TESTING W REFLEX): HIV Screen 4th Generation wRfx: NONREACTIVE

## 2020-04-10 SURGERY — ESOPHAGOGASTRODUODENOSCOPY (EGD) WITH PROPOFOL
Anesthesia: General

## 2020-04-10 MED ORDER — PANTOPRAZOLE SODIUM 40 MG PO TBEC
40.0000 mg | DELAYED_RELEASE_TABLET | Freq: Two times a day (BID) | ORAL | 2 refills | Status: DC
Start: 2020-04-10 — End: 2020-07-03

## 2020-04-10 MED ORDER — PROPOFOL 10 MG/ML IV BOLUS
INTRAVENOUS | Status: DC | PRN
  Administered 2020-04-10: 40 mg via INTRAVENOUS
  Administered 2020-04-10: 30 mg via INTRAVENOUS
  Administered 2020-04-10: 50 mg via INTRAVENOUS

## 2020-04-10 MED ORDER — TRAMADOL HCL 50 MG PO TABS: 50.0000 mg | ORAL_TABLET | Freq: Four times a day (QID) | ORAL | Status: DC | PRN

## 2020-04-10 MED ORDER — LIDOCAINE HCL (CARDIAC) PF 100 MG/5ML IV SOSY
PREFILLED_SYRINGE | INTRAVENOUS | Status: DC | PRN
  Administered 2020-04-10: 30 mg via INTRAVENOUS

## 2020-04-10 MED ORDER — PANTOPRAZOLE SODIUM 40 MG PO TBEC
40.0000 mg | DELAYED_RELEASE_TABLET | Freq: Every day | ORAL | 2 refills | Status: DC
Start: 2020-04-10 — End: 2020-04-10

## 2020-04-10 NOTE — Transfer of Care (Signed)
Immediate Anesthesia Transfer of Care Note  Patient: Charles Collins  Procedure(s) Performed: ESOPHAGOGASTRODUODENOSCOPY (EGD) WITH PROPOFOL (N/A )  Patient Location: PACU and Endoscopy Unit  Anesthesia Type:General  Level of Consciousness: drowsy  Airway & Oxygen Therapy: Patient Spontanous Breathing  Post-op Assessment: Report given to RN  Post vital signs: stable  Last Vitals:  Vitals Value Taken Time  BP    Temp    Pulse    Resp    SpO2      Last Pain:  Vitals:   04/10/20 1140  TempSrc: Temporal  PainSc: 2          Complications: No apparent anesthesia complications

## 2020-04-10 NOTE — Discharge Summary (Addendum)
Physician Discharge Summary   Charles Collins  male DOB: 1956/06/07  ZSW:[id number]  PCP: Tonia Ghent, MD  Admit date: 04/09/2020 Discharge date: 04/10/2020  Admitted From: home Disposition:  home CODE STATUS: Full code  Discharge Instructions    Discharge instructions   Complete by: As directed    EGD showed gastritis.  Biopsies taken.  Please take Protonix 40 mg twice a day until followup with GI doctor.  Follow-up with GI Dr. Marius Ditch in 1 to 2 months.  Stop taking Diclofenac which can make your gastritis worse.   [facility] Course:  For full details, please see H&P, progress notes, consult notes and ancillary notes.  Briefly,  Charles Collins is a 64 y.o. male with medical history significant of hyperlipidemia, melanoma, diverticulosis, who presented with Nausea, vomiting, diarrhea, abdominal pain, black tarry stool and melena.  Melena and nausea vomiting and diarrhea and abdominal pain Hgb stable and remained wnl.  CT angio abdomen and pelvis was unremarkable for any acute issues.  Dr. Marius Ditch performed EGD which showed gastritis.  Biopsies taken.  Pt was prescribed Protonix 40 mg twice a day until followup with GI doctor Dr. Marius Ditch in 1 to 2 months.  Pt was advised to taking Diclofenac.  Addendum: Helicobacter pylori infection Per Dr. Marius Ditch from telephone encounter 04/11/20, gastric biopsy result came back positive for Helicobacter pylori infection.  Prescription for triple therapy to treat H Pylori for 14 days send by Dr. Verlin Grills office. Pantoprazole 40mg  BID Clarithromycin 500mg  BID  Amoxicillin 1gm BID  HLD (hyperlipidemia) Patient is not taking statin at home currently.   Discharge Diagnoses:  Principal Problem:   Melena Active Problems:   Nausea vomiting and diarrhea   Abdominal pain   HLD (hyperlipidemia)    Discharge Instructions:  Allergies as of 04/10/2020      Reactions   Prilosec [omeprazole] Other (See Comments)   Not an allergy, but  "didn't feel well taking it"       Medication List    STOP taking these medications   diclofenac 75 MG EC tablet Commonly known as: VOLTAREN     TAKE these medications   acidophilus Caps capsule Take 1 capsule by mouth daily. Notes to patient: Not taken while in hospital   multivitamin tablet Take 1 tablet by mouth daily. Notes to patient: Not taken while in hospital   pantoprazole 40 MG tablet Commonly known as: Protonix Take 1 tablet (40 mg total) by mouth 2 (two) times daily. Notes to patient: Given by IV while in hospital        Follow-up Information    Schnier, Dolores Lory, MD. Schedule an appointment as soon as possible for a visit on 04/12/2020.   Specialties: Vascular Surgery, Cardiology, Radiology, Vascular Surgery Why: for follow up with vascular surgery;  (Appt w/ Odessa Fleming, NP)  @ 11:00 am Contact information: [place] [address] [phone]        Lin Landsman, MD In 1 month.   Specialty: Gastroenterology Why: Patient to call for Appt Contact information: [place] [address] [phone]        Tonia Ghent, MD. Schedule an appointment as soon as possible for a visit on 04/17/2020.   Specialty: Family Medicine Why: @ 3:15 pm Contact information: [place] [address] [phone]           Allergies  Allergen Reactions  .  Prilosec [Omeprazole] Other (See Comments)    Not an allergy, but "didn't feel well taking it"      The results of significant diagnostics from this hospitalization (including imaging, microbiology, ancillary and laboratory) are listed below for reference.   Consultations:   Procedures/Studies: CT Angio Abd/Pel W and/or Wo Contrast  Result Date: 04/09/2020 CLINICAL DATA:  Acute epigastric abdominal pain with nausea and vomiting. History of ischemic colitis. EXAM: CTA ABDOMEN AND PELVIS WITHOUT AND WITH CONTRAST TECHNIQUE: Multidetector CT  imaging of the abdomen and pelvis was performed using the standard protocol during bolus administration of intravenous contrast. Multiplanar reconstructed images and MIPs were obtained and reviewed to evaluate the vascular anatomy. CONTRAST:  174mL OMNIPAQUE IOHEXOL 350 MG/ML SOLN COMPARISON:  None. FINDINGS: VASCULAR Aorta: Normal caliber abdominal aorta. No aneurysm or dissection. Minimal distal atherosclerotic calcification. Celiac: No ostial stenosis. 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 Maximum measuring is 10.5 mm. No atherosclerotic calcifications. SMA: Normal Renals: Normal IMA: Normal Inflow: Normal Proximal Outflow: Normal Veins: Unremarkable Review of the MIP images confirms the above findings. NON-VASCULAR Lower chest: Emphysematous changes are noted with peripheral pulmonary scarring possibly reflecting interstitial lung disease. No infiltrates or effusions. The heart is normal in size. No pericardial effusion. The lower thoracic aorta is unremarkable. No dissection. Hepatobiliary: No hepatic lesions are identified. Slight contour abnormality of the, slightly dilated hepatic fissures and slight increased size of the caudate lobe could suggest early changes of cirrhosis. Recommend clinical correlation. The gallbladder is unremarkable.  No common bile duct dilatation. Pancreas: No mass, inflammation or ductal dilatation. Spleen: Normal size.  No focal lesions. Adrenals/Urinary Tract: The adrenal glands are unremarkable. Simple right renal cyst. No worrisome renal lesions or hydronephrosis. The bladder is unremarkable. Stomach/Bowel: The stomach, duodenum, small bowel and colon are unremarkable. No acute inflammatory changes, mass lesions or obstructive findings. The terminal ileum is normal. The appendix is not identified for certain but I do not see any findings to suggest appendicitis. Significant and diffuse diverticulosis most notably involving the descending colon and sigmoid  colon. No findings to suggest acute diverticulitis. Lymphatic: No mesenteric or retroperitoneal mass or adenopathy. Reproductive: The prostate gland and seminal vesicles are unremarkable. Other: No pelvic mass or adenopathy. No free pelvic fluid collections. Bilateral inguinal hernias containing fat. Suspect bilateral varicoceles. Musculoskeletal: No significant bony findings. Moderate degenerative disc disease at L4-5. Moderate age advanced bilateral hip joint degenerative changes. IMPRESSION: 1. No abdominal aortic aneurysm or dissection. 2. No significant mesenteric arterial stenosis. 3. 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 4. No acute abdominal/pelvic findings, mass lesions or lymphadenopathy. 5. Diffuse colonic diverticulosis without findings to suggest acute diverticulitis. 6. Emphysematous changes and peripheral pulmonary scarring possibly reflecting interstitial lung disease. 7. Bilateral inguinal hernias containing fat. Suspect bilateral varicoceles. Emphysema (ICD10-J43.9). Electronically Signed   By: Marijo Sanes M.D.   On: 04/09/2020 11:34      Labs: BNP (last 3 results) No results for input(s): BNP in the last 8760 hours. Basic Metabolic Panel: Recent Labs  Lab 04/09/20 0640 04/10/20 0205  NA 138 138  K 4.7 4.5  CL 103 103  CO2 26 26  GLUCOSE 124* 120*  BUN 13 9  CREATININE 1.01 0.90  CALCIUM 9.4 8.3*   Liver Function Tests: Recent Labs  Lab 04/09/20 0640  AST 27  ALT 28  ALKPHOS 70  BILITOT 0.6  PROT 7.0  ALBUMIN 4.0   Recent Labs  Lab 04/09/20 0640  LIPASE  28   No results for input(s): AMMONIA in the last 168 hours. CBC: Recent Labs  Lab 04/09/20 0640 04/09/20 1415 04/09/20 2130 04/10/20 0205 04/10/20 0810  WBC 7.6 10.0 11.8* 11.8* 13.2*  HGB 15.2 15.3 14.0 13.4 13.2  HCT 44.5 44.4 42.0 40.5 39.9  MCV 89.9 89.7 92.5 93.1 93.7  PLT 220 191 185 175 177   Cardiac Enzymes: No results for input(s): CKTOTAL, CKMB,  CKMBINDEX, TROPONINI in the last 168 hours. BNP: Invalid input(s): POCBNP CBG: Recent Labs  Lab 04/10/20 0833  GLUCAP 104*   D-Dimer No results for input(s): DDIMER in the last 72 hours. Hgb A1c No results for input(s): HGBA1C in the last 72 hours. Lipid Profile No results for input(s): CHOL, HDL, LDLCALC, TRIG, CHOLHDL, LDLDIRECT in the last 72 hours. Thyroid function studies No results for input(s): TSH, T4TOTAL, T3FREE, THYROIDAB in the last 72 hours.  Invalid input(s): FREET3 Anemia work up No results for input(s): VITAMINB12, FOLATE, FERRITIN, TIBC, IRON, RETICCTPCT in the last 72 hours. Urinalysis    Component Value Date/Time   COLORURINE YELLOW (A) 04/09/2020 0640   APPEARANCEUR CLEAR (A) 04/09/2020 0640   LABSPEC 1.015 04/09/2020 0640   PHURINE 5.0 04/09/2020 0640   GLUCOSEU NEGATIVE 04/09/2020 0640   HGBUR NEGATIVE 04/09/2020 0640   BILIRUBINUR NEGATIVE 04/09/2020 0640   KETONESUR NEGATIVE 04/09/2020 0640   PROTEINUR NEGATIVE 04/09/2020 0640   NITRITE NEGATIVE 04/09/2020 0640   LEUKOCYTESUR NEGATIVE 04/09/2020 0640   Sepsis Labs Invalid input(s): PROCALCITONIN,  WBC,  LACTICIDVEN Microbiology Recent Results (from the past 240 hour(s))  SARS Coronavirus 2 by RT PCR (hospital order, performed in [place] hospital lab) Nasopharyngeal Nasopharyngeal Swab     Status: None   Collection Time: 04/09/20  1:12 PM   Specimen: Nasopharyngeal Swab  Result Value Ref Range Status   SARS Coronavirus 2 NEGATIVE NEGATIVE Final    Comment: (NOTE) SARS-CoV-2 target nucleic acids are NOT DETECTED. The SARS-CoV-2 RNA is generally detectable in upper and lower respiratory specimens during the acute phase of infection. The lowest concentration of SARS-CoV-2 viral copies this assay can detect is 250 copies / mL. A negative result does not preclude SARS-CoV-2 infection and should not be used as the sole basis for treatment or other patient management decisions.  A negative  result may occur with improper specimen collection / handling, submission of specimen other than nasopharyngeal swab, presence of viral mutation(s) within the areas targeted by this assay, and inadequate number of viral copies (<250 copies / mL). A negative result must be combined with clinical observations, patient history, and epidemiological information. Fact Sheet for Patients:   StrictlyIdeas.no Fact Sheet for Healthcare Providers: BankingDealers.co.za This test is not yet approved or cleared  by the [place] FDA and has been authorized for detection and/or diagnosis of SARS-CoV-2 by FDA under an Emergency Use Authorization (EUA).  This EUA will remain in effect (meaning this test can be used) for the duration of the COVID-19 declaration under Section 564(b)(1) of the Act, 21 U.S.C. section 360bbb-3(b)(1), unless the authorization is terminated or revoked sooner. Performed at [facility], [address] [place], [place] [address]      Total time spend on discharging this patient, including the last patient exam, discussing the hospital stay, instructions for ongoing care as it relates to all pertinent caregivers, as well as preparing the medical discharge records, prescriptions, and/or referrals as applicable, is 40 minutes.    Enzo Bi, MD  [facility] 04/10/2020, 2:45 PM  If 7PM-7AM, please contact night-coverage

## 2020-04-10 NOTE — Progress Notes (Signed)
Pt d/c home via private vehicle drivien by wife.  Discharge instructions were reviewed with pt and wife and both expressed understanding.  All belongings were taken at time of d/c.  IV removed from R forearm without issue.  Pt wheeled to med mall and assisted into car by NT.  NAD noted at time of d/c.

## 2020-04-10 NOTE — Progress Notes (Signed)
Pt reports consistent 4/10 abdominal pain relieved by Dilaudid 1mg  IV q3h prn for severe pain, noting pt has had dose consistently and not reporting pain as severe contacted Sharion Settler, NP requesting alternative med for mild/moderate pain, no new orders received, NP advised to continue current pain regimen, pt currently resting comfortable in bed with bed in low safe position and call bell in reach, will continue to monitor pt this shift and report any concerns or change in pt status.

## 2020-04-10 NOTE — Anesthesia Preprocedure Evaluation (Signed)
Anesthesia Evaluation  Patient identified by MRN, date of birth, ID band Patient awake    Reviewed: Allergy & Precautions, H&P , NPO status , Patient's Chart, lab work & pertinent test results, reviewed documented beta blocker date and time   History of Anesthesia Complications Negative for: history of anesthetic complications  Airway Mallampati: I  TM Distance: >3 FB Neck ROM: full    Dental  (+) Dental Advidsory Given, Teeth Intact   Pulmonary neg pulmonary ROS, former smoker,    Pulmonary exam normal breath sounds clear to auscultation       Cardiovascular Exercise Tolerance: Good negative cardio ROS Normal cardiovascular exam Rhythm:regular Rate:Normal     Neuro/Psych negative neurological ROS  negative psych ROS   GI/Hepatic Neg liver ROS, GERD  ,  Endo/Other  negative endocrine ROS  Renal/GU negative Renal ROS  negative genitourinary   Musculoskeletal   Abdominal   Peds  Hematology negative hematology ROS (+)   Anesthesia Other Findings Past Medical History: 11/2004: Cancer ([place])     Comment:  Melanoma, R Knee by Dr Nehemiah Massed No date: Diverticulosis     Comment:  DIVERTICULOSIS ISCHEMIC COLITIS VIA COLONOSCOPY(DR.               SEIGAL):(01/2001) 06/27/2010: Hx of dysplastic nevus     Comment:  L dorsum mid lat. foot   Reproductive/Obstetrics negative OB ROS                             Anesthesia Physical Anesthesia Plan  ASA: II  Anesthesia Plan: General   Post-op Pain Management:    Induction: Intravenous  PONV Risk Score and Plan: 2 and Propofol infusion and TIVA  Airway Management Planned: Natural Airway and Nasal Cannula  Additional Equipment:   Intra-op Plan:   Post-operative Plan:   Informed Consent: I have reviewed the patients History and Physical, chart, labs and discussed the procedure including the risks, benefits and alternatives for the proposed  anesthesia with the patient or authorized representative who has indicated his/her understanding and acceptance.     Dental Advisory Given  Plan Discussed with: Anesthesiologist, CRNA and Surgeon  Anesthesia Plan Comments:         Anesthesia Quick Evaluation

## 2020-04-10 NOTE — Plan of Care (Signed)
  Problem: Education: Goal: Knowledge of General Education information will improve Description: Including pain rating scale, medication(s)/side effects and non-pharmacologic comfort measures Outcome: Completed/Met

## 2020-04-10 NOTE — Op Note (Signed)
[facility] Patient Name: Charles Collins Procedure Date: 04/10/2020 12:15 PM MRN: [id number] Account #: [id number] Date of Birth: December 23, 1955 Admit Type: Inpatient Age: 64 Room: [facility] ENDO ROOM 1 Gender: Male Note Status: Finalized Procedure:             Upper GI endoscopy Indications:           Epigastric abdominal pain, Melena Providers:             Lin Landsman MD, MD Referring MD:          Elveria Rising. Damita Dunnings, MD (Referring MD) Medicines:             Monitored Anesthesia Care Complications:         No immediate complications. Estimated blood loss: None. Procedure:             Pre-Anesthesia Assessment:                        - Prior to the procedure, a History and Physical was                         performed, and patient medications and allergies were                         reviewed. The patient is competent. The risks and                         benefits of the procedure and the sedation options and                         risks were discussed with the patient. All questions                         were answered and informed consent was obtained.                         Patient identification and proposed procedure were                         verified by the physician, the nurse, the                         anesthesiologist, the anesthetist and the technician                         in the pre-procedure area in the procedure room in the                         endoscopy suite. Mental Status Examination: alert and                         oriented. Airway Examination: normal oropharyngeal                         airway and neck mobility. Respiratory Examination:                         clear to auscultation. CV Examination: normal.  Prophylactic Antibiotics: The patient does not require                         prophylactic antibiotics. Prior Anticoagulants: The                         patient has taken no previous  anticoagulant or                         antiplatelet agents except for NSAID medication. ASA                         Grade Assessment: II - A patient with mild systemic                         disease. After reviewing the risks and benefits, the                         patient was deemed in satisfactory condition to                         undergo the procedure. The anesthesia plan was to use                         monitored anesthesia care (MAC). Immediately prior to                         administration of medications, the patient was                         re-assessed for adequacy to receive sedatives. The                         heart rate, respiratory rate, oxygen saturations,                         blood pressure, adequacy of pulmonary ventilation, and                         response to care were monitored throughout the                         procedure. The physical status of the patient was                         re-assessed after the procedure.                        After obtaining informed consent, the endoscope was                         passed under direct vision. Throughout the procedure,                         the patient's blood pressure, pulse, and oxygen                         saturations were monitored continuously. The Endoscope  was introduced through the mouth, and advanced to the                         second part of duodenum. The upper GI endoscopy was                         accomplished without difficulty. The patient tolerated                         the procedure well. Findings:      The duodenal bulb and second portion of the duodenum were normal.      Diffuse moderate inflammation characterized by congestion (edema),       erythema, friability, granularity and linear erosions was found in the       entire examined stomach. Biopsies were taken with a cold forceps for       Helicobacter pylori testing.      The  gastroesophageal junction and examined esophagus were normal. Impression:            - Normal duodenal bulb and second portion of the                         duodenum.                        - Gastritis. Biopsied.                        - Normal gastroesophageal junction and esophagus. Recommendation:        - Return patient to hospital ward for possible                         discharge same day.                        - Resume previous diet today.                        - Continue present medications.                        - Use Prilosec (omeprazole) 40 mg PO daily for 2                         months.                        - No ibuprofen, naproxen, or other non-steroidal                         anti-inflammatory drugs.                        - Await pathology results.                        - Return to my office in 2 months. Procedure Code(s):     --- Professional ---                        [phone], Esophagogastroduodenoscopy, flexible,  transoral; with biopsy, single or multiple Diagnosis Code(s):     --- Professional ---                        K29.70, Gastritis, unspecified, without bleeding                        K92.1, Melena (includes Hematochezia)                        R10.13, Epigastric pain CPT copyright 2019 American Medical Association. All rights reserved. The codes documented in this report are preliminary and upon coder review may  be revised to meet current compliance requirements. Dr. Ulyess Mort Lin Landsman MD, MD 04/10/2020 12:38:53 PM This report has been signed electronically. Number of Addenda: 0 Note Initiated On: 04/10/2020 12:15 PM Estimated Blood Loss:  Estimated blood loss: none.      [facility]

## 2020-04-10 NOTE — Anesthesia Postprocedure Evaluation (Signed)
Anesthesia Post Note  Patient: Charles Collins  Procedure(s) Performed: ESOPHAGOGASTRODUODENOSCOPY (EGD) WITH PROPOFOL (N/A )  Patient location during evaluation: Endoscopy Anesthesia Type: General Level of consciousness: awake and alert Pain management: pain level controlled Vital Signs Assessment: post-procedure vital signs reviewed and stable Respiratory status: spontaneous breathing, nonlabored ventilation, respiratory function stable and patient connected to nasal cannula oxygen Cardiovascular status: blood pressure returned to baseline and stable Postop Assessment: no apparent nausea or vomiting Anesthetic complications: no     Last Vitals:  Vitals:   04/10/20 1303 04/10/20 1313  BP: (!) 93/56 (!) 94/54  Pulse: 79 67  Resp: 13 12  Temp:    SpO2: 99% 100%    Last Pain:  Vitals:   04/10/20 1313  TempSrc:   PainSc: 0-No pain                 Martha Clan

## 2020-04-11 ENCOUNTER — Telehealth: Payer: Self-pay

## 2020-04-11 ENCOUNTER — Encounter: Payer: Self-pay | Admitting: *Deleted

## 2020-04-11 DIAGNOSIS — Q400 Congenital hypertrophic pyloric stenosis: Secondary | ICD-10-CM

## 2020-04-11 LAB — SURGICAL PATHOLOGY

## 2020-04-11 MED ORDER — PANTOPRAZOLE SODIUM 40 MG PO TBEC
40.0000 mg | DELAYED_RELEASE_TABLET | Freq: Two times a day (BID) | ORAL | 0 refills | Status: DC
Start: 2020-04-11 — End: 2020-04-20

## 2020-04-11 MED ORDER — AMOXICILLIN 500 MG PO TABS
1000.0000 mg | ORAL_TABLET | Freq: Two times a day (BID) | ORAL | 0 refills | Status: AC
Start: 2020-04-11 — End: 2020-04-25

## 2020-04-11 MED ORDER — CLARITHROMYCIN 500 MG PO TABS
500.0000 mg | ORAL_TABLET | Freq: Two times a day (BID) | ORAL | 0 refills | Status: AC
Start: 2020-04-11 — End: 2020-04-25

## 2020-04-11 NOTE — Telephone Encounter (Signed)
Called and left a message for call back  

## 2020-04-11 NOTE — Telephone Encounter (Signed)
-----   Message from Lin Landsman, MD sent at 04/11/2020 11:51 AM EDT ----- Gastric biopsy result came back positive for Helicobacter pylori infectionPlz send him the prescription for triple therapy to treat H Pylori for 14days  Pantoprazole 40mg  BID Clarithromycin 500mg  BID  Amoxicillin 1gm BID  Please order H. pylori breath test, to be performed 4 to 6 weeks after completion of treatment.  He should be of PPI or H2 blocker for 2 weeks at least before undergoing the breath test  Thanks RV

## 2020-04-11 NOTE — Telephone Encounter (Signed)
Patient verbalized understanding and sent medication to the pharmacy  

## 2020-04-12 ENCOUNTER — Ambulatory Visit (INDEPENDENT_AMBULATORY_CARE_PROVIDER_SITE_OTHER): Payer: Self-pay | Admitting: Nurse Practitioner

## 2020-04-12 ENCOUNTER — Other Ambulatory Visit: Payer: Self-pay

## 2020-04-12 ENCOUNTER — Other Ambulatory Visit (INDEPENDENT_AMBULATORY_CARE_PROVIDER_SITE_OTHER): Payer: Self-pay

## 2020-04-12 DIAGNOSIS — E7800 Pure hypercholesterolemia, unspecified: Secondary | ICD-10-CM

## 2020-04-12 LAB — COMPREHENSIVE METABOLIC PANEL
ALT: 31 U/L (ref 0–53)
AST: 22 U/L (ref 0–37)
Albumin: 3.7 g/dL (ref 3.5–5.2)
Alkaline Phosphatase: 66 U/L (ref 39–117)
BUN: 11 mg/dL (ref 6–23)
CO2: 27 mEq/L (ref 19–32)
Calcium: 8.8 mg/dL (ref 8.4–10.5)
Chloride: 103 mEq/L (ref 96–112)
Creatinine, Ser: 0.89 mg/dL (ref 0.40–1.50)
GFR: 86.24 mL/min (ref >60.00)
Glucose, Bld: 89 mg/dL (ref 70–99)
Potassium: 3.9 mEq/L (ref 3.5–5.1)
Sodium: 137 mEq/L (ref 135–145)
Total Bilirubin: 0.6 mg/dL (ref 0.2–1.2)
Total Protein: 6.9 g/dL (ref 6.0–8.3)

## 2020-04-12 LAB — LIPID PANEL
Cholesterol: 178 mg/dL (ref 0–200)
HDL: 38.9 mg/dL — ABNORMAL LOW (ref >39.00)
LDL Cholesterol: 113 mg/dL — ABNORMAL HIGH (ref 0–99)
NonHDL: 139.41
Total CHOL/HDL Ratio: 5
Triglycerides: 130 mg/dL (ref 0.0–149.0)
VLDL: 26 mg/dL (ref 0.0–40.0)

## 2020-04-17 ENCOUNTER — Inpatient Hospital Stay: Payer: Self-pay | Admitting: Family Medicine

## 2020-04-20 ENCOUNTER — Encounter: Payer: Self-pay | Admitting: Family Medicine

## 2020-04-20 ENCOUNTER — Ambulatory Visit (INDEPENDENT_AMBULATORY_CARE_PROVIDER_SITE_OTHER): Payer: Self-pay | Admitting: Family Medicine

## 2020-04-20 ENCOUNTER — Other Ambulatory Visit: Payer: Self-pay

## 2020-04-20 VITALS — BP 102/68 | HR 64 | Temp 96.6°F | Ht 67.0 in | Wt 173.2 lb

## 2020-04-20 DIAGNOSIS — A048 Other specified bacterial intestinal infections: Secondary | ICD-10-CM

## 2020-04-20 DIAGNOSIS — M722 Plantar fascial fibromatosis: Secondary | ICD-10-CM

## 2020-04-20 DIAGNOSIS — E7800 Pure hypercholesterolemia, unspecified: Secondary | ICD-10-CM

## 2020-04-20 DIAGNOSIS — Z7189 Other specified counseling: Secondary | ICD-10-CM

## 2020-04-20 DIAGNOSIS — Z0000 Encounter for general adult medical examination without abnormal findings: Secondary | ICD-10-CM

## 2020-04-20 NOTE — Progress Notes (Signed)
This visit occurred during the SARS-CoV-2 public health emergency.  Safety protocols were in place, including screening questions prior to the visit, additional usage of staff PPE, and extensive cleaning of exam room while observing appropriate contact time as indicated for disinfecting solutions.  CPE- See plan.  Routine anticipatory guidance given to patient.  See health maintenance.  The possibility exists that previously documented standard health maintenance information may have been brought forward from a previous encounter into this note.  If needed, that same information has been updated to reflect the current situation based on today's encounter.    Tetanus 2013 Flu shot encouraged PNA shot not due, d/w pt Shingles shot d/w pt.   covid vaccine encouraged.  Prostate cancer screening and PSA options(with potential risks and benefits of testing vs not testing) were discussed along with recent recs/guidelines. He declined testing PSAat this point.He preferred not to check PSA since he has not LUTS and no FH.  D/w patient [id number] for colon cancer screening, including IFOB vs. colonoscopy.  Risks and benefits of both were discussed and patient voiced understanding.  I want him to ask about GI f/u re: colon cancer screening.  He agrees. Diet and exercise d/w pt.  Living will d/w pt. Wife designated if patient were incapacitated. [facility]   He has routine derm f/u.    Rare chewing tobacco, d/w pt.   Inpatient EGD with H pylori positive.  Still on abx and rationale d/w pt.  He is avoiding NSAIDS.  He has minimal abd discomfort, clearly better than prev.  This could be abx related.  No vomiting, no blood in stool.  Normal BMs except mildly loose stools.  Back to work in the meantime.    He has GI and vascular fu pending.    Previous imaging with 1. No abdominal aortic aneurysm or dissection. 2. No significant mesenteric arterial  stenosis. 3. 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 4. No acute abdominal/pelvic findings, mass lesions or lymphadenopathy. 5. Diffuse colonic diverticulosis without findings to suggest acute diverticulitis. 6. Emphysematous changes and peripheral pulmonary scarring possibly reflecting interstitial lung disease. 7. 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  D/w pt about lipids given the celiac changes described above.  I'll await vascular input.  No CP, SOB, BLE edema.  Not lightheaded.    He is stretching, icing for plantar fasciitis and going to get new shoes soon.  Discussed avoiding nsaids.    PMH and SH reviewed  Meds, vitals, and allergies reviewed.   ROS: Per HPI.  Unless specifically indicated otherwise in HPI, the patient denies:  General: fever. Eyes: acute vision changes ENT: sore throat Cardiovascular: chest pain Respiratory: SOB GI: vomiting GU: dysuria Musculoskeletal: acute back pain Derm: acute rash Neuro: acute motor dysfunction Psych: worsening mood Endocrine: polydipsia Heme: bleeding Allergy: hayfever  GEN: nad, alert and oriented HEENT: ncat NECK: supple w/o LA CV: rrr. PULM: ctab, no inc wob ABD: soft, +bs EXT: no edema SKIN: no acute rash  The 10-year ASCVD risk score Mikey Bussing DC Jr., et al., 2013) is: 8%   Values used to calculate the score:     Age: 64 years     Sex: Male     Is Non-Hispanic African American: No     Diabetic: No     Tobacco smoker: No     Systolic Blood Pressure: 106 mmHg     Is BP  treated: No     HDL Cholesterol: 38.9 mg/dL     Total Cholesterol: 178 mg/dL

## 2020-04-20 NOTE — Patient Instructions (Addendum)
Ask GI about colon cancer screening.  If you want to do the cards after that, then let me know.    Please ask Dr. Lucky Cowboy for his thoughts on your cholesterol.   Take care.  Glad to see you. Update me as needed.

## 2020-04-22 DIAGNOSIS — A048 Other specified bacterial intestinal infections: Secondary | ICD-10-CM | POA: Insufficient documentation

## 2020-04-22 NOTE — Assessment & Plan Note (Signed)
Inpatient EGD with H pylori positive.  Still on abx and rationale d/w pt.  He is avoiding NSAIDS.  He has minimal abd discomfort, clearly better than prev.  This could be abx related.  No vomiting, no blood in stool.  Normal BMs except mildly loose stools.  Back to work in the meantime.    He has GI f/u pending.

## 2020-04-22 NOTE — Assessment & Plan Note (Signed)
Given his celiac artery changes, I want him to follow-up with vascular surgery and I would like their input on him starting a statin.  Discussed.

## 2020-04-22 NOTE — Assessment & Plan Note (Signed)
Living will d/w pt.  Wife designated if patient were incapacitated.   ?

## 2020-04-22 NOTE — Assessment & Plan Note (Signed)
Tetanus 2013 Flu shot encouraged PNA shot not due, d/w pt Shingles shot d/w pt.   covid vaccine encouraged.  Prostate cancer screening and PSA options(with potential risks and benefits of testing vs not testing) were discussed along with recent recs/guidelines. He declined testing PSAat this point.He preferred not to check PSA since he has not LUTS and no FH.  D/w patient [id number] for colon cancer screening, including IFOB vs. colonoscopy.  Risks and benefits of both were discussed and patient voiced understanding.  I want him to ask about GI f/u re: colon cancer screening.  He agrees. Diet and exercise d/w pt.  Living will d/w pt. Wife designated if patient were incapacitated. [facility]

## 2020-04-22 NOTE — Assessment & Plan Note (Signed)
He is stretching, icing for plantar fasciitis and going to get new shoes soon.  Discussed avoiding nsaids.

## 2020-04-27 ENCOUNTER — Ambulatory Visit (INDEPENDENT_AMBULATORY_CARE_PROVIDER_SITE_OTHER): Payer: Self-pay | Admitting: Vascular Surgery

## 2020-04-27 ENCOUNTER — Encounter (INDEPENDENT_AMBULATORY_CARE_PROVIDER_SITE_OTHER): Payer: Self-pay | Admitting: Vascular Surgery

## 2020-04-27 ENCOUNTER — Other Ambulatory Visit: Payer: Self-pay

## 2020-04-27 VITALS — BP 124/81 | HR 63 | Resp 19 | Ht 67.0 in | Wt 174.0 lb

## 2020-04-27 DIAGNOSIS — A048 Other specified bacterial intestinal infections: Secondary | ICD-10-CM

## 2020-04-27 DIAGNOSIS — I728 Aneurysm of other specified arteries: Secondary | ICD-10-CM | POA: Insufficient documentation

## 2020-04-27 DIAGNOSIS — E785 Hyperlipidemia, unspecified: Secondary | ICD-10-CM

## 2020-04-27 NOTE — Assessment & Plan Note (Signed)
The patient has undergone CT scan of the abdomen and pelvis.  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  This measures 10.5 mm in maximal diameter and is mildly enlarged just prior to the trifurcation. This poses him basically no risk at current, but we will need to monitor this over time particular given his young age and good health status.  We discussed that if this grows to a size requiring repair which is usually closer to 2 cm, we would evaluate how best to fix this.  I discussed avoidance of tobacco which she does not smoke.  We discussed blood pressure control which she says his blood pressure is generally pretty good.  We will try to follow this annually with duplex.  He will contact our office with any problems in the interim.

## 2020-04-27 NOTE — Assessment & Plan Note (Signed)
Treatment has resolved his symptoms.

## 2020-04-27 NOTE — Progress Notes (Signed)
Patient ID: Charles Collins, male   DOB: 1956/01/21, 64 y.o.   MRN: [id number]  Chief Complaint  Patient presents with  . Follow-up    HPI Charles Collins is a 64 y.o. male.  I am asked to see the patient by Dr. 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  This measures 10.5 mm in maximal diameter and is mildly enlarged just prior to the trifurcation.  The reason for this was severe abdominal pain, diarrhea, and some blood in the stools.  He ultimately underwent an endoscopic evaluation was found to have H. pylori infection.  He is feeling much better after treatment for this.  The CT scan was from about 3 weeks ago.  He really has no other complaints today. He also said his primary care physician asked [place] to comment on use of statins.  He was put on a statin for hyperlipidemia but has gotten his cholesterol down with diet, exercise, and weight loss.  It is now in a range where he could come off of statins.  His CT scan also demonstrated essentially no significant atherosclerosis throughout the abdomen or pelvis vessels, so I am okay if he is not on a statin agent.     Past Medical History:  Diagnosis Date  . Cancer ([place]) 11/2004   Melanoma, R Knee by Dr Nehemiah Massed  . Diverticulosis    DIVERTICULOSIS ISCHEMIC COLITIS VIA COLONOSCOPY(DR. SEIGAL):(01/2001)  . H. pylori infection   . Hx of dysplastic nevus 06/27/2010   L dorsum mid lat. foot    Past Surgical History:  Procedure Laterality Date  . APPENDECTOMY     As Teen  . ESOPHAGOGASTRODUODENOSCOPY (EGD) WITH PROPOFOL N/A 04/10/2020   Procedure: ESOPHAGOGASTRODUODENOSCOPY (EGD) WITH PROPOFOL;  Surgeon: Lin Landsman, MD;  Location: [facility];  Service: Gastroenterology;  Laterality: N/A;  . KNEE SURGERY     Open Repair Right as Teen  . MELANOMA EXCISION Right 2006   Knee. Clark's level IV, Breslow's 0.31mm     Family History   Problem Relation Age of Onset  . Stroke Mother 18       Cause of Death, [place]hage  . Diabetes Father   . Hypertension Father   . Obesity Father   . Kidney disease Father   . Kidney cancer Father   . Pancreatic cancer Father        presumed, no biopsy done  . Hypertension Paternal Grandfather   . Heart disease Neg Hx   . Cancer Neg Hx   . Depression Neg Hx   . Drug abuse Neg Hx   . Alcohol abuse Neg Hx   . Colon cancer Neg Hx   . Prostate cancer Neg Hx       Social History   Tobacco Use  . Smoking status: Former Smoker    Packs/day: 0.02    Years: 2.00    Pack years: 43.04    Quit date: 11/03/1968    Years since quitting: 51.5  . Smokeless tobacco: Current User    Types: Chew  . Tobacco comment: quit over 30 years ago, smoked briefly as a teenager  Vaping Use  . Vaping Use: Never used  Substance Use Topics  . Alcohol use: No    Alcohol/week: 0.0 standard drinks  . Drug use: No     Allergies  Allergen Reactions  . Nsaids     H/o gastritis  .  Prilosec [Omeprazole] Other (See Comments)    Not an allergy, but "didn't feel well taking it"     Current Outpatient Medications  Medication Sig Dispense Refill  . Multiple Vitamin (MULTIVITAMIN) tablet Take 1 tablet by mouth daily.    . pantoprazole (PROTONIX) 40 MG tablet Take 1 tablet (40 mg total) by mouth 2 (two) times daily. 60 tablet 2  . acidophilus (RISAQUAD) CAPS capsule Take 1 capsule by mouth daily. (Patient not taking: Reported on 04/27/2020)     No current facility-administered medications for this visit.      REVIEW OF SYSTEMS (Negative unless checked)  Constitutional: [] Weight loss  [] Fever  [] Chills Cardiac: [] Chest pain   [] Chest pressure   [] Palpitations   [] Shortness of breath when laying flat   [] Shortness of breath at rest   [] Shortness of breath with exertion. Vascular:  [] Pain in legs with walking   [] Pain in legs at rest   [] Pain in legs when laying flat   [] Claudication   [] Pain in feet  when walking  [] Pain in feet at rest  [] Pain in feet when laying flat   [] History of DVT   [] Phlebitis   [] Swelling in legs   [] Varicose veins   [] Non-healing ulcers Pulmonary:   [] Uses home oxygen   [] Productive cough   [] Hemoptysis   [] Wheeze  [] COPD   [] Asthma Neurologic:  [] Dizziness  [] Blackouts   [] Seizures   [] History of stroke   [] History of TIA  [] Aphasia   [] Temporary blindness   [] Dysphagia   [] Weakness or numbness in arms   [] Weakness or numbness in legs Musculoskeletal:  [] Arthritis   [] Joint swelling   [] Joint pain   [] Low back pain Hematologic:  [] Easy bruising  [] Easy bleeding   [] Hypercoagulable state   [] Anemic  [] Hepatitis Gastrointestinal:  [x] Blood in stool   [] Vomiting blood  [x] Gastroesophageal reflux/heartburn   [x] Abdominal pain Genitourinary:  [] Chronic kidney disease   [] Difficult urination  [] Frequent urination  [] Burning with urination   [] Hematuria Skin:  [] Rashes   [] Ulcers   [] Wounds Psychological:  [] History of anxiety   []  History of major depression.    Physical Exam BP 124/81 (BP Location: Right Arm)   Pulse 63   Resp 19   Ht 5\' 7"  (1.702 m)   Wt 174 lb (78.9 kg)   BMI 27.25 kg/m  Gen:  WD/WN, NAD.  Appears younger than stated age Head: /AT, No temporalis wasting.  Ear/Nose/Throat: Hearing grossly intact, nares w/o erythema or drainage, oropharynx w/o Erythema/Exudate Eyes: Conjunctiva clear, sclera non-icteric  Neck: trachea midline.  No JVD.  Pulmonary:  Good air movement, respirations not labored, no use of accessory muscles  Cardiac: RRR, no JVD Vascular:  Vessel Right Left  Radial Palpable Palpable                                   Gastrointestinal:. No masses, surgical incisions, or scars. Musculoskeletal: M/S 5/5 throughout.  Extremities without ischemic changes.  No deformity or atrophy.  No edema. Neurologic: Sensation grossly intact in extremities.  Symmetrical.  Speech is fluent. Motor exam as listed above. Psychiatric:  Judgment intact, Mood & affect appropriate for pt's clinical situation. Dermatologic: No rashes or ulcers noted.  No cellulitis or open wounds.    Radiology CT Angio Abd/Pel W and/or Wo Contrast  Result Date: 04/09/2020 CLINICAL DATA:  Acute epigastric abdominal pain with nausea and vomiting. History of ischemic colitis. EXAM: CTA  ABDOMEN AND PELVIS WITHOUT AND WITH CONTRAST TECHNIQUE: Multidetector CT imaging of the abdomen and pelvis was performed using the standard protocol during bolus administration of intravenous contrast. Multiplanar reconstructed images and MIPs were obtained and reviewed to evaluate the vascular anatomy. CONTRAST:  153mL OMNIPAQUE IOHEXOL 350 MG/ML SOLN COMPARISON:  None. FINDINGS: VASCULAR Aorta: Normal caliber abdominal aorta. No aneurysm or dissection. Minimal distal atherosclerotic calcification. Celiac: No ostial stenosis. 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 Maximum measuring is 10.5 mm. No atherosclerotic calcifications. SMA: Normal Renals: Normal IMA: Normal Inflow: Normal Proximal Outflow: Normal Veins: Unremarkable Review of the MIP images confirms the above findings. NON-VASCULAR Lower chest: Emphysematous changes are noted with peripheral pulmonary scarring possibly reflecting interstitial lung disease. No infiltrates or effusions. The heart is normal in size. No pericardial effusion. The lower thoracic aorta is unremarkable. No dissection. Hepatobiliary: No hepatic lesions are identified. Slight contour abnormality of the, slightly dilated hepatic fissures and slight increased size of the caudate lobe could suggest early changes of cirrhosis. Recommend clinical correlation. The gallbladder is unremarkable.  No common bile duct dilatation. Pancreas: No mass, inflammation or ductal dilatation. Spleen: Normal size.  No focal lesions. Adrenals/Urinary Tract: The adrenal glands are unremarkable. Simple right renal cyst. No worrisome renal lesions or  hydronephrosis. The bladder is unremarkable. Stomach/Bowel: The stomach, duodenum, small bowel and colon are unremarkable. No acute inflammatory changes, mass lesions or obstructive findings. The terminal ileum is normal. The appendix is not identified for certain but I do not see any findings to suggest appendicitis. Significant and diffuse diverticulosis most notably involving the descending colon and sigmoid colon. No findings to suggest acute diverticulitis. Lymphatic: No mesenteric or retroperitoneal mass or adenopathy. Reproductive: The prostate gland and seminal vesicles are unremarkable. Other: No pelvic mass or adenopathy. No free pelvic fluid collections. Bilateral inguinal hernias containing fat. Suspect bilateral varicoceles. Musculoskeletal: No significant bony findings. Moderate degenerative disc disease at L4-5. Moderate age advanced bilateral hip joint degenerative changes. IMPRESSION: 1. No abdominal aortic aneurysm or dissection. 2. No significant mesenteric arterial stenosis. 3. 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 4. No acute abdominal/pelvic findings, mass lesions or lymphadenopathy. 5. Diffuse colonic diverticulosis without findings to suggest acute diverticulitis. 6. Emphysematous changes and peripheral pulmonary scarring possibly reflecting interstitial lung disease. 7. Bilateral inguinal hernias containing fat. Suspect bilateral varicoceles. Emphysema (ICD10-J43.9). Electronically Signed   By: Marijo Sanes M.D.   On: 04/09/2020 11:34    Labs Recent Results (from the past 2160 hour(s))  Lipase, blood     Status: None   Collection Time: 04/09/20  6:40 AM  Result Value Ref Range   Lipase 28 11 - 51 U/L    Comment: Performed at [facility], [address] [place], [place] [address]  Comprehensive metabolic panel     Status: Abnormal   Collection Time: 04/09/20  6:40 AM  Result Value Ref Range   Sodium 138 135 - 145 mmol/L    Potassium 4.7 3.5 - 5.1 mmol/L   Chloride 103 98 - 111 mmol/L   CO2 26 22 - 32 mmol/L   Glucose, Bld 124 (H) 70 - 99 mg/dL    Comment: Glucose reference range applies only to samples taken after fasting for at least 8 hours.   BUN 13 8 - 23 mg/dL   Creatinine, Ser 1.01 0.61 - 1.24 mg/dL   Calcium 9.4 8.9 - 10.3 mg/dL   Total Protein 7.0 6.5 - 8.1 g/dL  Albumin 4.0 3.5 - 5.0 g/dL   AST 27 15 - 41 U/L   ALT 28 0 - 44 U/L   Alkaline Phosphatase 70 38 - 126 U/L   Total Bilirubin 0.6 0.3 - 1.2 mg/dL   GFR calc non Af Amer >60 >60 mL/min   GFR calc Af Amer >60 >60 mL/min   Anion gap 9 5 - 15    Comment: Performed at [facility], [address] [place], [place] [address]  CBC     Status: None   Collection Time: 04/09/20  6:40 AM  Result Value Ref Range   WBC 7.6 4.0 - 10.5 K/uL   RBC 4.95 4.22 - 5.81 MIL/uL   Hemoglobin 15.2 13.0 - 17.0 g/dL   HCT 44.5 39 - 52 %   MCV 89.9 80.0 - 100.0 fL   MCH 30.7 26.0 - 34.0 pg   MCHC 34.2 30.0 - 36.0 g/dL   RDW 12.7 11.5 - 15.5 %   Platelets 220 150 - 400 K/uL   nRBC 0.0 0.0 - 0.2 %    Comment: Performed at [facility], [address] [place], [place] [address]  Urinalysis, Complete w Microscopic     Status: Abnormal   Collection Time: 04/09/20  6:40 AM  Result Value Ref Range   Color, Urine YELLOW (A) YELLOW   APPearance CLEAR (A) CLEAR   Specific Gravity, Urine 1.015 1.005 - 1.030   pH 5.0 5.0 - 8.0   Glucose, UA NEGATIVE NEGATIVE mg/dL   Hgb urine dipstick NEGATIVE NEGATIVE   Bilirubin Urine NEGATIVE NEGATIVE   Ketones, ur NEGATIVE NEGATIVE mg/dL   Protein, ur NEGATIVE NEGATIVE mg/dL   Nitrite NEGATIVE NEGATIVE   Leukocytes,Ua NEGATIVE NEGATIVE   RBC / HPF 0-5 0 - 5 RBC/hpf   WBC, UA 0-5 0 - 5 WBC/hpf   Bacteria, UA NONE SEEN NONE SEEN   Squamous Epithelial / LPF NONE SEEN 0 - 5   Mucus PRESENT     Comment: Performed at [facility], [place], [place] [address]  Troponin I (High  Sensitivity)     Status: None   Collection Time: 04/09/20  6:40 AM  Result Value Ref Range   Troponin I (High Sensitivity) 3 <18 ng/L    Comment: (NOTE) Elevated high sensitivity troponin I (hsTnI) values and significant  changes across serial measurements may suggest ACS but many other  chronic and acute conditions are known to elevate hsTnI results.  Refer to the "Links" section for chest pain algorithms and additional  guidance. Performed at [facility], [address] [place], [place] [address]   SARS Coronavirus 2 by RT PCR (hospital order, performed in [facility] hospital lab) Nasopharyngeal Nasopharyngeal Swab     Status: None   Collection Time: 04/09/20  1:12 PM   Specimen: Nasopharyngeal Swab  Result Value Ref Range   SARS Coronavirus 2 NEGATIVE NEGATIVE    Comment: (NOTE) SARS-CoV-2 target nucleic acids are NOT DETECTED. The SARS-CoV-2 RNA is generally detectable in upper and lower respiratory specimens during the acute phase of infection. The lowest concentration of SARS-CoV-2 viral copies this assay can detect is 250 copies / mL. A negative result does not preclude SARS-CoV-2 infection and should not be used as the sole basis for treatment or other patient management decisions.  A negative result may occur with improper specimen collection / handling, submission of specimen other than nasopharyngeal swab, presence of viral mutation(s) within the areas targeted by  this assay, and inadequate number of viral copies (<250 copies / mL). A negative result must be combined with clinical observations, patient history, and epidemiological information. Fact Sheet for Patients:   StrictlyIdeas.no Fact Sheet for Healthcare Providers: BankingDealers.co.za This test is not yet approved or cleared  by the [place] FDA and has been authorized for detection and/or diagnosis of SARS-CoV-2 by FDA under an Emergency Use  Authorization (EUA).  This EUA will remain in effect (meaning this test can be used) for the duration of the COVID-19 declaration under Section 564(b)(1) of the Act, 21 U.S.C. section 360bbb-3(b)(1), unless the authorization is terminated or revoked sooner. Performed at [facility], [address] [place], [place] [address]   CBC     Status: None   Collection Time: 04/09/20  2:15 PM  Result Value Ref Range   WBC 10.0 4.0 - 10.5 K/uL   RBC 4.95 4.22 - 5.81 MIL/uL   Hemoglobin 15.3 13.0 - 17.0 g/dL   HCT 44.4 39 - 52 %   MCV 89.7 80.0 - 100.0 fL   MCH 30.9 26.0 - 34.0 pg   MCHC 34.5 30.0 - 36.0 g/dL   RDW 12.9 11.5 - 15.5 %   Platelets 191 150 - 400 K/uL   nRBC 0.0 0.0 - 0.2 %    Comment: Performed at [facility], [address] [place], [place] [address]  Protime-INR     Status: None   Collection Time: 04/09/20  2:15 PM  Result Value Ref Range   Prothrombin Time 12.8 11.4 - 15.2 seconds   INR 1.0 0.8 - 1.2    Comment: (NOTE) INR goal varies based on device and disease states. Performed at [facility], [address] [place], [place] [address]   APTT     Status: None   Collection Time: 04/09/20  2:15 PM  Result Value Ref Range   aPTT 30 24 - 36 seconds    Comment: Performed at [facility], [address] [place], [place] [address]  Type and screen     Status: None   Collection Time: 04/09/20  3:01 PM  Result Value Ref Range   ABO/RH(D) O POS    Antibody Screen NEG    Sample Expiration      04/12/2020,2359 Performed at [facility], [address] [place],  [address]   CBC     Status: Abnormal   Collection Time: 04/09/20  9:30 PM  Result Value Ref Range   WBC 11.8 (H) 4.0 - 10.5 K/uL   RBC 4.54 4.22 - 5.81 MIL/uL   Hemoglobin 14.0 13.0 - 17.0 g/dL   HCT 42.0 39 - 52 %   MCV 92.5 80.0 - 100.0 fL   MCH 30.8 26.0 - 34.0 pg   MCHC 33.3 30.0 - 36.0 g/dL   RDW 12.8 11.5 - 15.5 %   Platelets 185 150 - 400  K/uL   nRBC 0.0 0.0 - 0.2 %    Comment: Performed at [facility], [address] [place],  [address]  HIV Antibody (routine testing w rflx)     Status: None   Collection Time: 04/09/20  9:30 PM  Result Value Ref Range   HIV Screen 4th Generation wRfx Non Reactive Non Reactive    Comment: Performed at [facility], [place] [address] [place],  [address]  CBC     Status: Abnormal   Collection Time: 04/10/20  2:05 AM  Result Value Ref  Range   WBC 11.8 (H) 4.0 - 10.5 K/uL   RBC 4.35 4.22 - 5.81 MIL/uL   Hemoglobin 13.4 13.0 - 17.0 g/dL   HCT 40.5 39 - 52 %   MCV 93.1 80.0 - 100.0 fL   MCH 30.8 26.0 - 34.0 pg   MCHC 33.1 30.0 - 36.0 g/dL   RDW 12.8 11.5 - 15.5 %   Platelets 175 150 - 400 K/uL   nRBC 0.0 0.0 - 0.2 %    Comment: Performed at [facility], [address], [place], [place] [address]  Basic metabolic panel     Status: Abnormal   Collection Time: 04/10/20  2:05 AM  Result Value Ref Range   Sodium 138 135 - 145 mmol/L   Potassium 4.5 3.5 - 5.1 mmol/L   Chloride 103 98 - 111 mmol/L   CO2 26 22 - 32 mmol/L   Glucose, Bld 120 (H) 70 - 99 mg/dL    Comment: Glucose reference range applies only to samples taken after fasting for at least 8 hours.   BUN 9 8 - 23 mg/dL   Creatinine, Ser 0.90 0.61 - 1.24 mg/dL   Calcium 8.3 (L) 8.9 - 10.3 mg/dL   GFR calc non Af Amer >60 >60 mL/min   GFR calc Af Amer >60 >60 mL/min   Anion gap 9 5 - 15    Comment: Performed at [facility], [address] [place], [place] [address]  CBC     Status: Abnormal   Collection Time: 04/10/20  8:10 AM  Result Value Ref Range   WBC 13.2 (H) 4.0 - 10.5 K/uL   RBC 4.26 4.22 - 5.81 MIL/uL   Hemoglobin 13.2 13.0 - 17.0 g/dL   HCT 39.9 39 - 52 %   MCV 93.7 80.0 - 100.0 fL   MCH 31.0 26.0 - 34.0 pg   MCHC 33.1 30.0 - 36.0 g/dL   RDW 13.1 11.5 - 15.5 %   Platelets 177 150 - 400 K/uL   nRBC 0.0 0.0 - 0.2 %    Comment: Performed at [facility],  [address] [place], [place] [address]  Glucose, capillary     Status: Abnormal   Collection Time: 04/10/20  8:33 AM  Result Value Ref Range   Glucose-Capillary 104 (H) 70 - 99 mg/dL    Comment: Glucose reference range applies only to samples taken after fasting for at least 8 hours.   Comment 1 Notify RN    Comment 2 Document in Chart   Surgical pathology     Status: None   Collection Time: 04/10/20 12:31 PM  Result Value Ref Range   SURGICAL PATHOLOGY      SURGICAL PATHOLOGY CASE: [id number] PATIENT: Cherie Ouch Surgical Pathology Report     Specimen Submitted: A. Stomach, random; cbx  Clinical History: Melena.  Gastric erythema      DIAGNOSIS: A.  STOMACH, RANDOM; COLD BIOPSY: - MODERATE CHRONIC ACTIVE HELICOBACTER-ASSOCIATED GASTRITIS. - CHANGES CONSISTENT WITH PROTON PUMP INHIBITOR EFFECT. - NEGATIVE FOR DYSPLASIA AND MALIGNANCY.   GROSS DESCRIPTION: A. Labeled: C BX random stomach Received: Formalin Tissue fragment(s): Multiple Size: Aggregate, 1.0 x 0.2 x 0.1 cm Description: Tan soft tissue fragments Entirely submitted in 1 cassette.    Final Diagnosis performed by Quay Burow, MD.   Electronically signed 04/11/2020 9:26:22AM The electronic signature indicates that the named Attending Pathologist has evaluated the specimen Technical component performed at [facility], [address] [place], [place] [address] Lab: [phone] Dir:  Rush Farmer, MD, MMM  Professional component performed at [place], [address], [place], [place] [address] Lab: [phone] Dir: Dellia Nims. Rubinas, MD   Lipid panel     Status: Abnormal   Collection Time: 04/12/20  8:13 AM  Result Value Ref Range   Cholesterol 178 0 - 200 mg/dL    Comment: ATP III Classification       Desirable:  < 200 mg/dL               Borderline High:  200 - 239 mg/dL          High:  > = 240 mg/dL   Triglycerides 130.0 0 - 149 mg/dL    Comment: Normal:   <150 mg/dLBorderline High:  150 - 199 mg/dL   HDL 38.90 (L) >39.00 mg/dL   VLDL 26.0 0.0 - 40.0 mg/dL   LDL Cholesterol 113 (H) 0 - 99 mg/dL   Total CHOL/HDL Ratio 5     Comment:                Men          Women1/2 Average Risk     3.4          3.3Average Risk          5.0          4.42X Average Risk          9.6          7.13X Average Risk          15.0          11.0                       NonHDL 139.41     Comment: NOTE:  Non-HDL goal should be 30 mg/dL higher than patient's LDL goal (i.e. LDL goal of < 70 mg/dL, would have non-HDL goal of < 100 mg/dL)  Comprehensive metabolic panel     Status: None   Collection Time: 04/12/20  8:13 AM  Result Value Ref Range   Sodium 137 135 - 145 mEq/L   Potassium 3.9 3.5 - 5.1 mEq/L   Chloride 103 96 - 112 mEq/L   CO2 27 19 - 32 mEq/L   Glucose, Bld 89 70 - 99 mg/dL   BUN 11 6 - 23 mg/dL   Creatinine, Ser 0.89 0.40 - 1.50 mg/dL   Total Bilirubin 0.6 0.2 - 1.2 mg/dL   Alkaline Phosphatase 66 39 - 117 U/L   AST 22 0 - 37 U/L   ALT 31 0 - 53 U/L   Total Protein 6.9 6.0 - 8.3 g/dL   Albumin 3.7 3.5 - 5.2 g/dL   GFR 86.24 >60.00 mL/min   Calcium 8.8 8.4 - 10.5 mg/dL    Assessment/Plan:  HLD (hyperlipidemia) Has gotten this under control with diet and exercise.  No significant atherosclerosis seen on his CT scan so he is not going to be put on a statin at this time.  H. pylori infection Treatment has resolved his symptoms.  Celiac artery aneurysm [facility]) The patient has undergone CT scan of the abdomen and pelvis.  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  This measures 10.5 mm in maximal diameter and is mildly enlarged just prior to the trifurcation. This poses him basically no risk at current, but we will need to monitor  this over time particular given his young age and good health status.  We discussed that if this grows to a size requiring repair which is  usually closer to 2 cm, we would evaluate how best to fix this.  I discussed avoidance of tobacco which she does not smoke.  We discussed blood pressure control which she says his blood pressure is generally pretty good.  We will try to follow this annually with duplex.  He will contact our office with any problems in the interim.      Leotis Pain 04/27/2020, 10:15 AM   This note was created with Dragon medical transcription system.  Any errors from dictation are unintentional.

## 2020-04-27 NOTE — Assessment & Plan Note (Signed)
Has gotten this under control with diet and exercise.  No significant atherosclerosis seen on his CT scan so he is not going to be put on a statin at this time.

## 2020-05-08 ENCOUNTER — Other Ambulatory Visit: Payer: Self-pay

## 2020-05-08 ENCOUNTER — Ambulatory Visit (INDEPENDENT_AMBULATORY_CARE_PROVIDER_SITE_OTHER): Payer: Self-pay | Admitting: Podiatry

## 2020-05-08 ENCOUNTER — Encounter: Payer: Self-pay | Admitting: Podiatry

## 2020-05-08 ENCOUNTER — Ambulatory Visit (INDEPENDENT_AMBULATORY_CARE_PROVIDER_SITE_OTHER): Payer: Self-pay

## 2020-05-08 ENCOUNTER — Other Ambulatory Visit: Payer: Self-pay | Admitting: Podiatry

## 2020-05-08 DIAGNOSIS — M722 Plantar fascial fibromatosis: Secondary | ICD-10-CM

## 2020-05-08 DIAGNOSIS — M779 Enthesopathy, unspecified: Secondary | ICD-10-CM

## 2020-05-08 DIAGNOSIS — M7671 Peroneal tendinitis, right leg: Secondary | ICD-10-CM

## 2020-05-08 MED ORDER — METHYLPREDNISOLONE 4 MG PO TBPK: ORAL_TABLET | ORAL | 0 refills | Status: DC

## 2020-05-08 NOTE — Progress Notes (Signed)
   HPI: 64 y.o. male presenting today for follow up evaluation of plantar fasciitis of the left foot.  Patient states that the heel pain has returned.  Approximately 10 days ago he purchased a new pair of [employer].  Patient states that have helped significantly over the past 10 days.  Unfortunately he had to discontinue taking the diclofenac because it caused GI symptoms.  He is currently only taking Tylenol as needed for the pain.  Patient also has a new complaint today associated to right lateral foot pain.  Patient denies injury to the area however its been very painful for the past 4 weeks.  Pain with ambulation and walking during the day.  He cannot recall anything that would have elicited the pain.  He has not done anything for treatment at the moment.  Past Medical History:  Diagnosis Date  . Cancer ([place]) 11/2004   Melanoma, R Knee by Dr Nehemiah Massed  . Diverticulosis    DIVERTICULOSIS ISCHEMIC COLITIS VIA COLONOSCOPY(DR. SEIGAL):(01/2001)  . H. pylori infection   . Hx of dysplastic nevus 06/27/2010   L dorsum mid lat. foot     Physical Exam: General: The patient is alert and oriented x3 in no acute distress.  Dermatology: Skin is warm, dry and supple bilateral lower extremities. Negative for open lesions or macerations.  Vascular: Palpable pedal pulses bilaterally. No edema or erythema noted. Capillary refill within normal limits.  Neurological: Epicritic and protective threshold grossly intact bilaterally.   Musculoskeletal Exam: Range of motion within normal limits to all pedal and ankle joints bilateral. Muscle strength 5/5 in all groups bilateral.  Pain on palpation to the plantar fascia left heel.  There is also pain on palpation at the insertion of the peroneal tendon onto the fifth metatarsal tubercle right  Assessment: 1. Plantar fasciitis left foot  2.  Insertional peroneal tendinitis right   Plan of Care:  1. Patient evaluated.  2.  Discontinue diclofenac due to upset  GI 3.  Prescription for Medrol Dosepak 4.  Injection of 0.5 cc Celestone Soluspan injection to the plantar fascia left 5.  Continue Kuru Shoes.  Patient has had OTC power step insoles and braces that have only provided minimal relief.  He is very optimistic with the [place] 6.  Return to clinic as needed  *going to [facility] for vacation next week. 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       Edrick Kins, DPM [facility]  Dr. Edrick Kins, DPM    [address], [place] [address]                Office [phone]  Fax [phone]

## 2020-05-22 ENCOUNTER — Other Ambulatory Visit: Payer: Self-pay

## 2020-05-22 DIAGNOSIS — Q400 Congenital hypertrophic pyloric stenosis: Secondary | ICD-10-CM

## 2020-06-08 LAB — H. PYLORI BREATH TEST: H pylori Breath Test: NEGATIVE

## 2020-07-02 ENCOUNTER — Other Ambulatory Visit: Payer: Self-pay

## 2020-07-03 ENCOUNTER — Ambulatory Visit (INDEPENDENT_AMBULATORY_CARE_PROVIDER_SITE_OTHER): Payer: Self-pay | Admitting: Gastroenterology

## 2020-07-03 ENCOUNTER — Other Ambulatory Visit: Payer: Self-pay

## 2020-07-03 ENCOUNTER — Encounter: Payer: Self-pay | Admitting: Gastroenterology

## 2020-07-03 VITALS — BP 116/78 | HR 84 | Temp 98.0°F | Ht 67.0 in | Wt 175.2 lb

## 2020-07-03 DIAGNOSIS — R1310 Dysphagia, unspecified: Secondary | ICD-10-CM

## 2020-07-03 DIAGNOSIS — R1319 Other dysphagia: Secondary | ICD-10-CM

## 2020-07-03 NOTE — Progress Notes (Signed)
Cephas Darby, MD [address]  [place]  [place], [place] [address]  Main: [phone]  Fax: [phone]    Gastroenterology Consultation  Referring Provider:     Tonia Ghent, MD Primary Care Physician:  Tonia Ghent, MD Primary Gastroenterologist:  Dr. Cephas Darby Reason for Consultation:     Difficulty swallowing        HPI:   Charles Collins is a 64 y.o. male referred by Dr. Tonia Ghent, MD  for consultation & management of difficulty swallowing. Patient was admitted to [facility] in 04/2020 secondary to melena and epigastric pain, he underwent upper endoscopy which revealed H. pylori gastritis for which he was treated with triple therapy and B12 folate. H. pylori was confirmed eradicated on H. pylori testing 06/2020. He is here to discuss about difficulty swallowing to hard meats that he has been experiencing for more than a year. This is intermittent and sometimes associated with regurgitation. He has been on PPI until a month ago. Currently off PPI. He does report intermittent heartburn. He denies any weight loss. No evidence of anemia  He does not smoke or drink alcohol  NSAIDs: None  Antiplts/Anticoagulants/Anti thrombotics: None  GI Procedures:  EGD 04/11/2019 - Normal duodenal bulb and second portion of the duodenum. - Gastritis. Biopsied. - Normal gastroesophageal junction and esophagus.  DIAGNOSIS:  A. STOMACH, RANDOM; COLD BIOPSY:  - MODERATE CHRONIC ACTIVE HELICOBACTER-ASSOCIATED GASTRITIS.  - CHANGES CONSISTENT WITH PROTON PUMP INHIBITOR EFFECT.  - NEGATIVE FOR DYSPLASIA AND MALIGNANCY.  Colonoscopy 01/28/2001 Diverticulosis, ischemic colitis  Past Medical History:  Diagnosis Date   Cancer ([place]) 11/2004   Melanoma, R Knee by Dr Nehemiah Massed   Diverticulosis    DIVERTICULOSIS ISCHEMIC COLITIS VIA COLONOSCOPY(DR. SEIGAL):(01/2001)   H. pylori infection    Hx of dysplastic nevus 06/27/2010   L dorsum mid lat. foot    Past Surgical  History:  Procedure Laterality Date   APPENDECTOMY     As Teen   ESOPHAGOGASTRODUODENOSCOPY (EGD) WITH PROPOFOL N/A 04/10/2020   Procedure: ESOPHAGOGASTRODUODENOSCOPY (EGD) WITH PROPOFOL;  Surgeon: Lin Landsman, MD;  Location: [facility] ENDOSCOPY;  Service: Gastroenterology;  Laterality: N/A;   KNEE SURGERY     Open Repair Right as Teen   MELANOMA EXCISION Right 2006   Knee. Clark's level IV, Breslow's 0.71mm    Current Outpatient Medications:    Multiple Vitamin (MULTIVITAMIN) tablet, Take 1 tablet by mouth daily., Disp: , Rfl:     Family History  Problem Relation Age of Onset   Stroke Mother 34       Cause of Death, [place]   Diabetes Father    Hypertension Father    Obesity Father    Kidney disease Father    Kidney cancer Father    Pancreatic cancer Father        presumed, no biopsy done   Hypertension Paternal Grandfather    Heart disease Neg Hx    Cancer Neg Hx    Depression Neg Hx    Drug abuse Neg Hx    Alcohol abuse Neg Hx    Colon cancer Neg Hx    Prostate cancer Neg Hx      Social History   Tobacco Use   Smoking status: Former Smoker    Packs/day: 0.02    Years: 2.00    Pack years: 52.04    Quit date: 11/03/1968    Years since quitting: 51.6   Smokeless tobacco: Current User    Types:  Chew   Tobacco comment: quit over 30 years ago, smoked briefly as a teenager  Vaping Use   Vaping Use: Never used  Substance Use Topics   Alcohol use: No    Alcohol/week: 0.0 standard drinks   Drug use: No    Allergies as of 07/03/2020 - Review Complete 07/03/2020  Allergen Reaction Noted   Diclofenac Other (See Comments) 05/08/2020   Nsaids  04/20/2020   Prilosec [omeprazole] Other (See Comments) 01/05/2013    Review of Systems:    All systems reviewed and negative except where noted in HPI.   Physical Exam:  BP 116/78 (BP Location: Left Arm, Patient Position: Sitting, Cuff Size: Normal)    Pulse 84    Temp 98 F (36.7 C) (Oral)     Ht 5\' 7"  (1.702 m)    Wt 175 lb 4 oz (79.5 kg)    BMI 27.45 kg/m  No LMP for male patient.  General:   Alert,  Well-developed, well-nourished, pleasant and cooperative in NAD Head:  Normocephalic and atraumatic. Eyes:  Sclera clear, no icterus.   Conjunctiva pink. Ears:  Normal auditory acuity. Nose:  No deformity, discharge, or lesions. Mouth:  No deformity or lesions,oropharynx pink & moist. Neck:  Supple; no masses or thyromegaly. Lungs:  Respirations even and unlabored.  Clear throughout to auscultation.   No wheezes, crackles, or rhonchi. No acute distress. Heart:  Regular rate and rhythm; no murmurs, clicks, rubs, or gallops. Abdomen:  Normal bowel sounds. Soft, non-tender and non-distended without masses, hepatosplenomegaly or hernias noted.  No guarding or rebound tenderness.   Rectal: Not performed Msk:  Symmetrical without gross deformities. Good, equal movement & strength bilaterally. Pulses:  Normal pulses noted. Extremities:  No clubbing or edema.  No cyanosis. Neurologic:  Alert and oriented x3;  grossly normal neurologically. Skin:  Intact without significant lesions or rashes. No jaundice. Psych:  Alert and cooperative. Normal mood and affect.  Imaging Studies: Reviewed  Assessment and Plan:   Charles Collins is a 64 y.o. male with no significant past medical history, recent history of vaginal infection s/p treatment with triple therapy confirmed eradication is presented with dysphagia to solids  Dysphagia to solids, intermittent Recommend trial of omeprazole 20 mg two times daily before meals for 4 weeks If symptoms are persistent, he will call my office to schedule upper endoscopy. At that time, I will do esophageal biopsies to evaluate for eosinophilic esophagitis   Follow up in 3 months   Cephas Darby, MD

## 2020-08-07 ENCOUNTER — Other Ambulatory Visit: Payer: Self-pay

## 2020-08-07 ENCOUNTER — Ambulatory Visit (INDEPENDENT_AMBULATORY_CARE_PROVIDER_SITE_OTHER): Payer: Self-pay | Admitting: Podiatry

## 2020-08-07 ENCOUNTER — Encounter: Payer: Self-pay | Admitting: Podiatry

## 2020-08-07 DIAGNOSIS — M7671 Peroneal tendinitis, right leg: Secondary | ICD-10-CM

## 2020-08-07 NOTE — Progress Notes (Signed)
   HPI: 64 y.o. male presenting today for follow up evaluation of insertional peroneal tendinitis to the right foot.  Patient states that he continues to have right foot pain to the outside of the foot.This is been ongoing since his last visit here in the office on 05/08/2020  Patient states that his left heel is doing very well.  He continues to wear his [employer] work boots and they provide significant relief for his left heel pain.  He presents for further treatment evaluation  Past Medical History:  Diagnosis Date  . Cancer ([place]) 11/2004   Melanoma, R Knee by Dr Nehemiah Massed  . Diverticulosis    DIVERTICULOSIS ISCHEMIC COLITIS VIA COLONOSCOPY(DR. SEIGAL):(01/2001)  . H. pylori infection   . Hx of dysplastic nevus 06/27/2010   L dorsum mid lat. foot     Physical Exam: General: The patient is alert and oriented x3 in no acute distress.  Dermatology: Skin is warm, dry and supple bilateral lower extremities. Negative for open lesions or macerations.  Vascular: Palpable pedal pulses bilaterally. No edema or erythema noted. Capillary refill within normal limits.  Neurological: Epicritic and protective threshold grossly intact bilaterally.   Musculoskeletal Exam: Range of motion within normal limits to all pedal and ankle joints bilateral. Muscle strength 5/5 in all groups bilateral.  Negative for any significant pain on palpation to the plantar fascia left heel.  There is also pain on palpation at the insertion of the peroneal tendon onto the fifth metatarsal tubercle right  Assessment: 1. Plantar fasciitis left foot-resolved 2.  Insertional peroneal tendinitis right   Plan of Care:  1. Patient evaluated.  2.  Patient cannot take oral NSAIDs due to GI symptoms  3.  Patient recently finished a Medrol Dosepak that was prescribed last visit on 05/08/2020 4.  Injection of 0.5 cc Celestone Soluspan injection to the fifth metatarsal tubercle insertion of the peroneal tendon 5.  Continue Kuru Shoes.   Patient has had OTC power step insoles and braces that have only provided minimal relief.   6.  Return to clinic as needed  *going to [facility] for vacation next week. 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       Edrick Kins, DPM [facility]  Dr. Edrick Kins, DPM    [address], [place] [address]                Office [phone]  Fax [phone]

## 2020-10-18 ENCOUNTER — Other Ambulatory Visit: Payer: Self-pay

## 2020-10-18 ENCOUNTER — Encounter: Payer: Self-pay | Admitting: Dermatology

## 2020-10-18 ENCOUNTER — Ambulatory Visit (INDEPENDENT_AMBULATORY_CARE_PROVIDER_SITE_OTHER): Payer: Self-pay | Admitting: Dermatology

## 2020-10-18 DIAGNOSIS — L814 Other melanin hyperpigmentation: Secondary | ICD-10-CM

## 2020-10-18 DIAGNOSIS — Z86018 Personal history of other benign neoplasm: Secondary | ICD-10-CM

## 2020-10-18 DIAGNOSIS — L821 Other seborrheic keratosis: Secondary | ICD-10-CM

## 2020-10-18 DIAGNOSIS — D229 Melanocytic nevi, unspecified: Secondary | ICD-10-CM

## 2020-10-18 DIAGNOSIS — L570 Actinic keratosis: Secondary | ICD-10-CM

## 2020-10-18 DIAGNOSIS — L578 Other skin changes due to chronic exposure to nonionizing radiation: Secondary | ICD-10-CM

## 2020-10-18 DIAGNOSIS — Z85820 Personal history of malignant melanoma of skin: Secondary | ICD-10-CM

## 2020-10-18 DIAGNOSIS — Z1283 Encounter for screening for malignant neoplasm of skin: Secondary | ICD-10-CM

## 2020-10-18 DIAGNOSIS — D1800 Hemangioma unspecified site: Secondary | ICD-10-CM

## 2020-10-18 NOTE — Progress Notes (Signed)
   Follow-Up Visit   Subjective  Charles Collins is a 64 y.o. male who presents for the following: Annual Exam (Mole check ). Hx of Melanoma on the R knee. Hx of Dysplastic nevus  The patient presents for Total-Body Skin Exam (TBSE) for skin cancer screening and mole check.  The following portions of the chart were reviewed this encounter and updated as appropriate:   Tobacco  Allergies  Meds  Problems  Med Hx  Surg Hx  Fam Hx     Review of Systems:  No other skin or systemic complaints except as noted in HPI or Assessment and Plan.  Objective  Well appearing patient in no apparent distress; mood and affect are within normal limits.  A full examination was performed including scalp, head, eyes, ears, nose, lips, neck, chest, axillae, abdomen, back, buttocks, bilateral upper extremities, bilateral lower extremities, hands, feet, fingers, toes, fingernails, and toenails. All findings within normal limits unless otherwise noted below.  Objective  Left dorsum foot: Scar with no evidence of recurrence.   Objective  Right Knee - Anterior: Well healed scar with no evidence of recurrence, no lymphadenopathy.   Objective  L temple , R ear (4): Erythematous thin papules/macules with gritty scale.    Assessment & Plan  History of dysplastic nevus Left dorsum foot  Clear. Observe for recurrence. Call clinic for new or changing lesions.  Recommend regular skin exams, daily broad-spectrum spf 30+ sunscreen use, and photoprotection.     History of melanoma Right Knee - Anterior  Clear. Observe for recurrence. Call clinic for new or changing lesions.  Recommend regular skin exams, daily broad-spectrum spf 30+ sunscreen use, and photoprotection.     AK (actinic keratosis) (4) L temple , R ear  Destruction of lesion - L temple , R ear Complexity: simple   Destruction method: cryotherapy   Informed consent: discussed and consent obtained   Timeout:  patient name, date of birth,  surgical site, and procedure verified Lesion destroyed using liquid nitrogen: Yes   Region frozen until ice ball extended beyond lesion: Yes   Outcome: patient tolerated procedure well with no complications   Post-procedure details: wound care instructions given     Lentigines - Scattered tan macules - Discussed due to sun exposure - Benign, observe - Call for any changes  Seborrheic Keratoses - Stuck-on, waxy, tan-brown papules and plaques  - Discussed benign etiology and prognosis. - Observe - Call for any changes  Melanocytic Nevi - Tan-brown and/or pink-flesh-colored symmetric macules and papules - Benign appearing on exam today - Observation - Call clinic for new or changing moles - Recommend daily use of broad spectrum spf 30+ sunscreen to sun-exposed areas.   Hemangiomas - Red papules - Discussed benign nature - Observe - Call for any changes  Actinic Damage - Chronic, secondary to cumulative UV/sun exposure - diffuse scaly erythematous macules with underlying dyspigmentation - Recommend daily broad spectrum sunscreen SPF 30+ to sun-exposed areas, reapply every 2 hours as needed.  - Call for new or changing lesions.  Skin cancer screening performed today.  Return in about 1 year (around 10/18/2021) for TBSE.  IMarye Round, CMA, am acting as scribe for Sarina Ser, MD .  Documentation: I have reviewed the above documentation for accuracy and completeness, and I agree with the above.  Sarina Ser, MD

## 2020-10-18 NOTE — Patient Instructions (Addendum)
Cryotherapy Aftercare  . Wash gently with soap and water everyday.   Marland Kitchen Apply Vaseline and Band-Aid daily until healed. Recommend daily broad spectrum sunscreen SPF 30+ to sun-exposed areas, reapply every 2 hours as needed. Call for new or changing lesions.

## 2020-10-23 ENCOUNTER — Encounter: Payer: Self-pay | Admitting: Dermatology

## 2020-11-14 ENCOUNTER — Telehealth: Payer: Self-pay | Admitting: Nurse Practitioner

## 2020-11-14 DIAGNOSIS — R059 Cough, unspecified: Secondary | ICD-10-CM

## 2020-11-14 MED ORDER — DOXYCYCLINE HYCLATE 100 MG PO TABS
100.0000 mg | ORAL_TABLET | Freq: Two times a day (BID) | ORAL | 0 refills | Status: DC
Start: 2020-11-14 — End: 2020-12-14

## 2020-11-14 NOTE — Progress Notes (Signed)
We are sorry that you are not feeling well.  Here is how we plan to help!  Based on your presentation I believe you most likely have A cough due to bacteria.  When patients have a fever and a productive cough with a change in color or increased sputum production, we are concerned about bacterial bronchitis.  If left untreated it can progress to pneumonia.  If your symptoms do not improve with your treatment plan it is important that you contact your provider.   I have prescribed Doxycycline 100 mg twice a day for 7 days     In addition you may use A non-prescription cough medication called Mucinex DM: take 2 tablets every 12 hours.   From your responses in the eVisit questionnaire you describe inflammation in the upper respiratory tract which is causing a significant cough.  This is commonly called Bronchitis and has four common causes:    Allergies  Viral Infections  Acid Reflux  Bacterial Infection Allergies, viruses and acid reflux are treated by controlling symptoms or eliminating the cause. An example might be a cough caused by taking certain blood pressure medications. You stop the cough by changing the medication. Another example might be a cough caused by acid reflux. Controlling the reflux helps control the cough.  USE OF BRONCHODILATOR ("RESCUE") INHALERS: There is a risk from using your bronchodilator too frequently.  The risk is that over-reliance on a medication which only relaxes the muscles surrounding the breathing tubes can reduce the effectiveness of medications prescribed to reduce swelling and congestion of the tubes themselves.  Although you feel brief relief from the bronchodilator inhaler, your asthma may actually be worsening with the tubes becoming more swollen and filled with mucus.  This can delay other crucial treatments, such as oral steroid medications. If you need to use a bronchodilator inhaler daily, several times per day, you should discuss this with your  provider.  There are probably better treatments that could be used to keep your asthma under control.     HOME CARE . Only take medications as instructed by your medical team. . Complete the entire course of an antibiotic. . Drink plenty of fluids and get plenty of rest. . Avoid close contacts especially the very young and the elderly . Cover your mouth if you cough or cough into your sleeve. . Always remember to wash your hands . A steam or ultrasonic humidifier can help congestion.   GET HELP RIGHT AWAY IF: . You develop worsening fever. . You become short of breath . You cough up blood. . Your symptoms persist after you have completed your treatment plan MAKE SURE YOU   Understand these instructions.  Will watch your condition.  Will get help right away if you are not doing well or get worse.  Your e-visit answers were reviewed by a board certified advanced clinical practitioner to complete your personal care plan.  Depending on the condition, your plan could have included both over the counter or prescription medications. If there is a problem please reply  once you have received a response from your provider. Your safety is important to us.  If you have drug allergies check your prescription carefully.    You can use MyChart to ask questions about today's visit, request a non-urgent call back, or ask for a work or school excuse for 24 hours related to this e-Visit. If it has been greater than 24 hours you will need to follow up with your provider,   or enter a new e-Visit to address those concerns. You will get an e-mail in the next two days asking about your experience.  I hope that your e-visit has been valuable and will speed your recovery. Thank you for using e-visits.  5-10 minutes spent reviewing and documenting in chart.   

## 2020-12-14 ENCOUNTER — Ambulatory Visit (INDEPENDENT_AMBULATORY_CARE_PROVIDER_SITE_OTHER): Payer: Self-pay | Admitting: Podiatry

## 2020-12-14 ENCOUNTER — Encounter: Payer: Self-pay | Admitting: Podiatry

## 2020-12-14 ENCOUNTER — Other Ambulatory Visit: Payer: Self-pay

## 2020-12-14 DIAGNOSIS — M7671 Peroneal tendinitis, right leg: Secondary | ICD-10-CM

## 2020-12-14 MED ORDER — METHYLPREDNISOLONE 4 MG PO TBPK
ORAL_TABLET | ORAL | 0 refills | Status: DC
Start: 2020-12-14 — End: 2021-04-23

## 2020-12-14 NOTE — Progress Notes (Signed)
   HPI: 65 y.o. male presenting today for follow up evaluation of insertional peroneal tendinitis to the right foot.  Patient states that he continues to have right foot pain to the outside of the foot.This is been ongoing since his last visit here in the office on 05/08/2020  Patient states that his left heel is doing very well.  He continues to wear his [employer] work boots and they provide significant relief for his left heel pain.  He presents for further treatment evaluation  Past Medical History:  Diagnosis Date  . Cancer ([place]) 11/2004   Melanoma, R Knee by Dr Nehemiah Massed  . Diverticulosis    DIVERTICULOSIS ISCHEMIC COLITIS VIA COLONOSCOPY(DR. SEIGAL):(01/2001)  . H. pylori infection   . Hx of dysplastic nevus 06/27/2010   L dorsum mid lat. foot  . Melanoma ([place])    R knee      Physical Exam: General: The patient is alert and oriented x3 in no acute distress.  Dermatology: Skin is warm, dry and supple bilateral lower extremities. Negative for open lesions or macerations.  Vascular: Palpable pedal pulses bilaterally. No edema or erythema noted. Capillary refill within normal limits.  Neurological: Epicritic and protective threshold grossly intact bilaterally.   Musculoskeletal Exam: Range of motion within normal limits to all pedal and ankle joints bilateral. Muscle strength 5/5 in all groups bilateral.  Negative for any significant pain on palpation to the plantar fascia left heel.  There continues to be significant pain and tenderness at the insertion of the peroneal tendon onto the fifth metatarsal tubercle of the right foot  Assessment: 1. Insertional peroneal tendinitis right; chronic  Plan of Care:  1. Patient evaluated.  2.  Patient cannot take oral NSAIDs due to GI symptoms  3.  Prescription for Medrol Dosepak  4.  The patient has now been dealing with this pain for over 6 months with minimal improvement.  Despite conservative treatments he continues to have pain and tenderness  to the lateral aspect of the right foot. 5.  Today we are going to order an MRI right foot to rule out any peroneal tendon tear 6.  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       Edrick Kins, DPM [facility]  Dr. Edrick Kins, DPM    [address], [place] [address]                Office [phone]  Fax [phone]

## 2020-12-18 ENCOUNTER — Ambulatory Visit: Payer: Self-pay | Admitting: Podiatry

## 2020-12-30 ENCOUNTER — Other Ambulatory Visit: Payer: Self-pay

## 2020-12-30 ENCOUNTER — Ambulatory Visit
Admission: RE | Admit: 2020-12-30 | Discharge: 2020-12-30 | Disposition: A | Discharge status: Home / Self Care | Payer: Self-pay | Source: Ambulatory Visit | Attending: Podiatry | Admitting: Podiatry

## 2020-12-30 DIAGNOSIS — M7671 Peroneal tendinitis, right leg: Secondary | ICD-10-CM

## 2021-01-01 ENCOUNTER — Ambulatory Visit (INDEPENDENT_AMBULATORY_CARE_PROVIDER_SITE_OTHER): Payer: Self-pay | Admitting: Podiatry

## 2021-01-01 ENCOUNTER — Other Ambulatory Visit: Payer: Self-pay

## 2021-01-01 DIAGNOSIS — M722 Plantar fascial fibromatosis: Secondary | ICD-10-CM

## 2021-01-01 DIAGNOSIS — M7671 Peroneal tendinitis, right leg: Secondary | ICD-10-CM

## 2021-01-01 NOTE — Progress Notes (Addendum)
   HPI: 65 y.o. male presenting today for follow up evaluation of insertional peroneal tendinitis to the right foot.  Patient states that he continues to have right foot pain to the outside of the foot.This is been ongoing since his last visit here in the office on 05/08/2020  He continues to have tenderness to the lateral aspect of the foot. He continues to wear his [employer] work boots and they provide significant relief for his left heel pain.  He presents for further treatment evaluation and review MRI results  Past Medical History:  Diagnosis Date  . Cancer ([place]) 11/2004   Melanoma, R Knee by Dr Nehemiah Massed  . Diverticulosis    DIVERTICULOSIS ISCHEMIC COLITIS VIA COLONOSCOPY(DR. SEIGAL):(01/2001)  . H. pylori infection   . Hx of dysplastic nevus 06/27/2010   L dorsum mid lat. foot  . Melanoma ([place])    R knee      Physical Exam: General: The patient is alert and oriented x3 in no acute distress.  Dermatology: Skin is warm, dry and supple bilateral lower extremities. Negative for open lesions or macerations.  Vascular: Palpable pedal pulses bilaterally. No edema or erythema noted. Capillary refill within normal limits.  Neurological: Epicritic and protective threshold grossly intact bilaterally.   Musculoskeletal Exam: Range of motion within normal limits to all pedal and ankle joints bilateral. Muscle strength 5/5 in all groups bilateral.  Negative for any significant pain on palpation to the plantar fascia left heel.  There continues to be significant pain and tenderness at the insertion of the peroneal tendon onto the fifth metatarsal tubercle of the right foot  MRI impression 12/30/2020 1. Thickening of the lateral band of the plantar fascia at the insertion at the base of the fifth metatarsal concerning for fasciitis with mild surrounding soft tissue edema and subcortical marrow edema.  Assessment: 1. Insertional peroneal tendinitis right; chronic 2. Plantar fasciitis of the lateral  band as it inserts onto the 5th metatarsal tubercle  Plan of Care:  1. Patient evaluated.  MRI reviewed today 2.  Patient cannot take oral NSAIDs due to GI symptoms  3.  Continue OTC arch supports since they helped alleviate some of his pain 4.  The patient has now been dealing with this pain for over 6 months with minimal improvement.  Despite conservative treatments he continues to have pain and tenderness to the lateral aspect of the right foot. 5.  Based on the findings of the MRI I do suspect that surgery would benefit. Surgery would consist of peroneal tendon repair/inspection and release of the lateral band of the plantar fascia as it inserts onto the 5th metatarsal tubercle.  6. 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 Edrick Kins, DPM    [address], [place] [address]                Office [phone]  Fax [phone]

## 2021-03-05 ENCOUNTER — Other Ambulatory Visit: Payer: Self-pay

## 2021-03-05 ENCOUNTER — Ambulatory Visit (INDEPENDENT_AMBULATORY_CARE_PROVIDER_SITE_OTHER): Payer: Self-pay | Admitting: Podiatry

## 2021-03-05 DIAGNOSIS — M722 Plantar fascial fibromatosis: Secondary | ICD-10-CM

## 2021-03-05 DIAGNOSIS — M7671 Peroneal tendinitis, right leg: Secondary | ICD-10-CM

## 2021-03-05 NOTE — Progress Notes (Signed)
   HPI: 65 y.o. male presenting today for follow up evaluation of insertional peroneal tendinitis to the right foot.  Patient states that since last visit there has been some decent improvement.  He states that the pain is very tolerable.  He has very minimal tenderness to the lateral aspect of the foot. He continues to wear his [employer] work boots and they provide significant relief for his left heel pain.  He presents for further treatment evaluation and review MRI results  Past Medical History:  Diagnosis Date  . Cancer ([place]) 11/2004   Melanoma, R Knee by Dr Nehemiah Massed  . Diverticulosis    DIVERTICULOSIS ISCHEMIC COLITIS VIA COLONOSCOPY(DR. SEIGAL):(01/2001)  . H. pylori infection   . Hx of dysplastic nevus 06/27/2010   L dorsum mid lat. foot  . Melanoma ([place])    R knee      Physical Exam: General: The patient is alert and oriented x3 in no acute distress.  Dermatology: Skin is warm, dry and supple bilateral lower extremities. Negative for open lesions or macerations.  Vascular: Palpable pedal pulses bilaterally. No edema or erythema noted. Capillary refill within normal limits.  Neurological: Epicritic and protective threshold grossly intact bilaterally.   Musculoskeletal Exam: Range of motion within normal limits to all pedal and ankle joints bilateral. Muscle strength 5/5 in all groups bilateral.  Negative for any significant pain on palpation to the plantar fascia left heel.  There continues to be significant pain and tenderness at the insertion of the peroneal tendon onto the fifth metatarsal tubercle of the right foot  MRI impression 12/30/2020 1. Thickening of the lateral band of the plantar fascia at the insertion at the base of the fifth metatarsal concerning for fasciitis with mild surrounding soft tissue edema and subcortical marrow edema.  Assessment: 1. Insertional peroneal tendinitis right; chronic 2. Plantar fasciitis of the lateral band as it inserts onto the 5th  metatarsal tubercle  Plan of Care:  1. Patient evaluated.  MRI reviewed today 2.  Patient cannot take oral NSAIDs due to GI symptoms  3.  Continue OTC arch supports since they helped alleviate some of his pain 4.  Most recently the patient states that the irritation and pain is very tolerable.  At the moment we will continue conservative management and treatment. 5.  Today we did discuss surgery however I do not believe the patient's symptoms warrant surgery at this time.  We will simply observe for now 6.  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 Edrick Kins, DPM    [address], [place] [address]                Office [phone]  Fax [phone]

## 2021-04-14 ENCOUNTER — Other Ambulatory Visit: Payer: Self-pay | Admitting: Family Medicine

## 2021-04-14 DIAGNOSIS — E7800 Pure hypercholesterolemia, unspecified: Secondary | ICD-10-CM

## 2021-04-16 ENCOUNTER — Other Ambulatory Visit (INDEPENDENT_AMBULATORY_CARE_PROVIDER_SITE_OTHER): Payer: Self-pay

## 2021-04-16 ENCOUNTER — Other Ambulatory Visit: Payer: Self-pay

## 2021-04-16 DIAGNOSIS — E7800 Pure hypercholesterolemia, unspecified: Secondary | ICD-10-CM

## 2021-04-16 LAB — COMPREHENSIVE METABOLIC PANEL
ALT: 23 U/L (ref 0–53)
AST: 23 U/L (ref 0–37)
Albumin: 4.2 g/dL (ref 3.5–5.2)
Alkaline Phosphatase: 69 U/L (ref 39–117)
BUN: 11 mg/dL (ref 6–23)
CO2: 26 mEq/L (ref 19–32)
Calcium: 9 mg/dL (ref 8.4–10.5)
Chloride: 106 mEq/L (ref 96–112)
Creatinine, Ser: 0.99 mg/dL (ref 0.40–1.50)
GFR: 80.6 mL/min (ref >60.00)
Glucose, Bld: 91 mg/dL (ref 70–99)
Potassium: 4.4 mEq/L (ref 3.5–5.1)
Sodium: 140 mEq/L (ref 135–145)
Total Bilirubin: 0.5 mg/dL (ref 0.2–1.2)
Total Protein: 7.1 g/dL (ref 6.0–8.3)

## 2021-04-16 LAB — LIPID PANEL
Cholesterol: 215 mg/dL — ABNORMAL HIGH (ref 0–200)
HDL: 37.6 mg/dL — ABNORMAL LOW (ref >39.00)
NonHDL: 177.75
Total CHOL/HDL Ratio: 6
Triglycerides: 214 mg/dL — ABNORMAL HIGH (ref 0.0–149.0)
VLDL: 42.8 mg/dL — ABNORMAL HIGH (ref 0.0–40.0)

## 2021-04-16 LAB — LDL CHOLESTEROL, DIRECT: Direct LDL: 122 mg/dL

## 2021-04-22 ENCOUNTER — Ambulatory Visit (INDEPENDENT_AMBULATORY_CARE_PROVIDER_SITE_OTHER): Payer: Self-pay | Admitting: Vascular Surgery

## 2021-04-22 ENCOUNTER — Ambulatory Visit (INDEPENDENT_AMBULATORY_CARE_PROVIDER_SITE_OTHER): Payer: Self-pay

## 2021-04-22 ENCOUNTER — Encounter (INDEPENDENT_AMBULATORY_CARE_PROVIDER_SITE_OTHER): Payer: Self-pay | Admitting: Vascular Surgery

## 2021-04-22 ENCOUNTER — Other Ambulatory Visit: Payer: Self-pay

## 2021-04-22 VITALS — BP 146/86 | HR 76 | Ht 68.0 in | Wt 171.0 lb

## 2021-04-22 DIAGNOSIS — E7800 Pure hypercholesterolemia, unspecified: Secondary | ICD-10-CM

## 2021-04-22 DIAGNOSIS — I728 Aneurysm of other specified arteries: Secondary | ICD-10-CM

## 2021-04-22 NOTE — Progress Notes (Signed)
MRN : [id number]  Charles Collins is a 65 y.o. ([birth year]) male who presents with chief complaint of  Chief Complaint  Patient presents with   Follow-up    1 yr mesenteric   .  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  By the initial CT the celiac aneurysm measures 10.5 mm in maximal diameter and is mildly enlarged just prior to the trifurcation.  The reason for this was severe abdominal pain, diarrhea, and some blood in the stools.  He ultimately underwent an endoscopic evaluation was found to have H. pylori infection.  Since his treatment he states he is not had any of the previous abdominal symptoms.  He denies nausea vomiting he denies postprandial abdominal pain.      There have been no significant changes in his health care since his last visit.  Duplex ultrasound obtained today demonstrates the celiac aneurysm measures 11.5 mm.  No significant change compared to last years CT scan  Current Meds  Medication Sig   Multiple Vitamin (MULTIVITAMIN) tablet Take 1 tablet by mouth daily.    Past Medical History:  Diagnosis Date   Cancer ([place]) 11/2004   Melanoma, R Knee by Dr Nehemiah Massed   Diverticulosis    DIVERTICULOSIS ISCHEMIC COLITIS VIA COLONOSCOPY(DR. SEIGAL):(01/2001)   H. pylori infection    Hx of dysplastic nevus 06/27/2010   L dorsum mid lat. foot   Melanoma ([place])    R knee     Past Surgical History:  Procedure Laterality Date   APPENDECTOMY     As Teen   ESOPHAGOGASTRODUODENOSCOPY (EGD) WITH PROPOFOL N/A 04/10/2020   Procedure: ESOPHAGOGASTRODUODENOSCOPY (EGD) WITH PROPOFOL;  Surgeon: Lin Landsman, MD;  Location: [facility] ENDOSCOPY;  Service: Gastroenterology;  Laterality: N/A;   KNEE SURGERY     Open Repair Right as Teen   MELANOMA EXCISION Right 2006   Knee. Clark's level IV, Breslow's 0.55mm    Social  History Social History   Tobacco Use   Smoking status: Former    Packs/day: 0.02    Years: 2.00    Pack years: 0.04    Types: Cigarettes    Quit date: 11/03/1968    Years since quitting: 52.5   Smokeless tobacco: Current    Types: Chew   Tobacco comments:    quit over 30 years ago, smoked briefly as a teenager  Vaping Use   Vaping Use: Never used  Substance Use Topics   Alcohol use: No    Alcohol/week: 0.0 standard drinks   Drug use: No    Family History Family History  Problem Relation Age of Onset   Stroke Mother 40       Cause of Death, [place]   Diabetes Father    Hypertension Father    Obesity Father    Kidney disease Father    Kidney cancer Father    Pancreatic cancer Father        presumed, no biopsy done   Hypertension Paternal Grandfather    Heart disease Neg Hx    Cancer Neg Hx    Depression Neg Hx    Drug abuse Neg Hx    Alcohol abuse Neg Hx    Colon cancer Neg Hx    Prostate cancer Neg Hx     Allergies  Allergen Reactions   Diclofenac Other (See Comments)  Gastritis   Nsaids     H/o gastritis   Prilosec [Omeprazole] Other (See Comments)    Not an allergy, but "didn't feel well taking it"      REVIEW OF SYSTEMS (Negative unless checked)  Constitutional: [] Weight loss  [] Fever  [] Chills Cardiac: [] Chest pain   [] Chest pressure   [] Palpitations   [] Shortness of breath when laying flat   [] Shortness of breath with exertion. Vascular:  [] Pain in legs with walking   [] Pain in legs at rest  [] History of DVT   [] Phlebitis   [] Swelling in legs   [] Varicose veins   [] Non-healing ulcers Pulmonary:   [] Uses home oxygen   [] Productive cough   [] Hemoptysis   [] Wheeze  [] COPD   [] Asthma Neurologic:  [] Dizziness   [] Seizures   [] History of stroke   [] History of TIA  [] Aphasia   [] Vissual changes   [] Weakness or numbness in arm   [] Weakness or numbness in leg Musculoskeletal:   [] Joint swelling   [] Joint pain   [] Low back pain Hematologic:  [] Easy  bruising  [] Easy bleeding   [] Hypercoagulable state   [] Anemic Gastrointestinal:  [] Diarrhea   [] Vomiting  [] Gastroesophageal reflux/heartburn   [] Difficulty swallowing. Genitourinary:  [] Chronic kidney disease   [] Difficult urination  [] Frequent urination   [] Blood in urine Skin:  [] Rashes   [] Ulcers  Psychological:  [] History of anxiety   []  History of major depression.  Physical Examination  Vitals:   04/22/21 0842  BP: (!) 146/86  Pulse: 76  Weight: 171 lb (77.6 kg)  Height: 5\' 8"  (1.727 m)   Body mass index is 26 kg/m. Gen: WD/WN, NAD Head: [place]/AT, No temporalis wasting.  Ear/Nose/Throat: Hearing grossly intact, nares w/o erythema or drainage Eyes: PER, EOMI, sclera nonicteric.  Neck: Supple, no large masses.   Pulmonary:  Good air movement, no audible wheezing bilaterally, no use of accessory muscles.  Cardiac: RRR, no JVD Vascular:  Vessel Right Left  Radial Palpable Palpable  Gastrointestinal: Non-distended. No guarding/no peritoneal signs.  Musculoskeletal: M/S 5/5 throughout.  No deformity or atrophy.  Neurologic: CN 2-12 intact. Symmetrical.  Speech is fluent. Motor exam as listed above. Psychiatric: Judgment intact, Mood & affect appropriate for pt's clinical situation. Dermatologic: No rashes or ulcers noted.  No changes consistent with cellulitis.  CBC Lab Results  Component Value Date   WBC 13.2 (H) 04/10/2020   HGB 13.2 04/10/2020   HCT 39.9 04/10/2020   MCV 93.7 04/10/2020   PLT 177 04/10/2020    BMET    Component Value Date/Time   NA 140 04/16/2021 0758   K 4.4 04/16/2021 0758   CL 106 04/16/2021 0758   CO2 26 04/16/2021 0758   GLUCOSE 91 04/16/2021 0758   BUN 11 04/16/2021 0758   CREATININE 0.99 04/16/2021 0758   CALCIUM 9.0 04/16/2021 0758   GFRNONAA >60 04/10/2020 0205   GFRAA >60 04/10/2020 0205   Estimated Creatinine Clearance: 72.9 mL/min (by C-G formula based on SCr of 0.99 mg/dL).  COAG Lab Results  Component Value Date   INR 1.0  04/09/2020    Radiology No results found.   Assessment/Plan 1. Celiac artery aneurysm (HCC) No surgery or intervention at this time.  The patient has an asymptomatic celiac artery aneurysm that is less than 2.5 cm in maximal diameter.  I have discussed the natural history of celiac artery aneurysm and the small risk of rupture for aneurysm less than 2.5 cm in size.  However, as these small aneurysms tend to enlarge  over time, continued surveillance with ultrasound or CT scan is mandatory.   I have also discussed optimizing medical management with hypertension and lipid control.  The patient is also encouraged to exercise a minimum of 30 minutes 4 times a week.   Should the patient develop new onset abdominal or back pain or signs of peripheral embolization they are instructed to seek medical attention immediately and to alert the physician providing care that they have a celiac artery aneurysm.  The patient voices their understanding. The patient will return in 12 months with an aortic duplex.  - VAS [place] MESENTERIC; Future  2. HYPERCHOLESTEROLEMIA Continue conservative therapies as he has been doing, no changes at this time.  Statin therapy per primary service   Hortencia Pilar, MD  04/22/2021 8:51 AM

## 2021-04-23 ENCOUNTER — Encounter: Payer: Self-pay | Admitting: Family Medicine

## 2021-04-23 ENCOUNTER — Other Ambulatory Visit: Payer: Self-pay

## 2021-04-23 ENCOUNTER — Ambulatory Visit (INDEPENDENT_AMBULATORY_CARE_PROVIDER_SITE_OTHER): Payer: Self-pay | Admitting: Family Medicine

## 2021-04-23 VITALS — BP 118/76 | HR 79 | Temp 96.9°F | Ht 68.0 in | Wt 174.0 lb

## 2021-04-23 DIAGNOSIS — E785 Hyperlipidemia, unspecified: Secondary | ICD-10-CM

## 2021-04-23 DIAGNOSIS — Z7189 Other specified counseling: Secondary | ICD-10-CM

## 2021-04-23 DIAGNOSIS — Z1211 Encounter for screening for malignant neoplasm of colon: Secondary | ICD-10-CM

## 2021-04-23 DIAGNOSIS — M7060 Trochanteric bursitis, unspecified hip: Secondary | ICD-10-CM

## 2021-04-23 DIAGNOSIS — Z0000 Encounter for general adult medical examination without abnormal findings: Secondary | ICD-10-CM

## 2021-04-23 DIAGNOSIS — I728 Aneurysm of other specified arteries: Secondary | ICD-10-CM

## 2021-04-23 NOTE — Progress Notes (Signed)
This visit occurred during the SARS-CoV-2 public health emergency.  Safety protocols were in place, including screening questions prior to the visit, additional usage of staff PPE, and extensive cleaning of exam room while observing appropriate contact time as indicated for disinfecting solutions.  CPE- See plan.  Routine anticipatory guidance given to patient.  See health maintenance.  The possibility exists that previously documented standard health maintenance information may have been brought forward from a previous encounter into this note.  If needed, that same information has been updated to reflect the current situation based on today's encounter.    He is still seeing dermatology yearly.    Tetanus 2013 Shingles d/w pt.   PNA due at 66 Flu encouraged.   Covid vaccine encouraged.  Declined at this point. Prostate cancer screening and PSA options (with potential risks and benefits of testing vs not testing) were discussed along with recent recs/guidelines.  He declined testing PSA at this point. D/w patient [id number] for colon cancer screening, including IFOB vs. colonoscopy.  Risks and benefits of both were discussed and patient voiced understanding.  Pt elects for: IFOB.   Living will d/w pt.  Wife designated if patient were incapacitated.  HLD.  D/w pt about diet and exercise.  He wanted to defer tx at this point.  Discussed his ASCVD risk and his recent imaging.  See below.  The 10-year ASCVD risk score Mikey Bussing DC Brooke Bonito., et al., 2013) is: 12.9%   Values used to calculate the score:     Age: 65 years     Sex: Male     Is Non-Hispanic African American: No     Diabetic: No     Tobacco smoker: No     Systolic Blood Pressure: 195 mmHg     Is BP treated: No     HDL Cholesterol: 37.6 mg/dL     Total Cholesterol: 215 mg/dL  Recent imaging d/w pt.  No abd pain.   Mesenteric:  Normal Celiac artery , Superior Mesenteric artery, Inferior Mesenteric  artery, Splenic artery and Hepatic  artery findings.  Just past origin as celiac turns toward liver it shows minimal dilitation  of 1.12cm, otherwise normal appearance and flow.   L hip area and L lateral calf pain w/o connecting pain. Started about 2-3 months ago.  Went to chiropractor w/o relief.  No rash, no bruising.    He was turned down for blood donation from the [facility] cross but he didn't recall the reason.  See avs.  I asked him to get information on that and update me.  Swallowing well.  He has GI f/u pending.    PMH and SH reviewed  Meds, vitals, and allergies reviewed.   ROS: Per HPI.  Unless specifically indicated otherwise in HPI, the patient denies:  General: fever. Eyes: acute vision changes ENT: sore throat Cardiovascular: chest pain Respiratory: SOB GI: vomiting GU: dysuria Musculoskeletal: acute back pain Derm: acute rash Neuro: acute motor dysfunction Psych: worsening mood Endocrine: polydipsia Heme: bleeding Allergy: hayfever  GEN: nad, alert and oriented HEENT: ncat NECK: supple w/o LA CV: rrr. PULM: ctab, no inc wob ABD: soft, +bs EXT: no edema SKIN: no acute rash L greater troch ttp, L calf not ttp.  Able to bear weight.  Normal hip range of motion otherwise.

## 2021-04-23 NOTE — Patient Instructions (Addendum)
Please see why the red cross turned you down and let me know.  Shingles/flu/covid Go to the lab on the way out.   If you have mychart we'll likely use that to update you.     Likely trochanteric bursitis.  Ice as needed for 5 minutes at a time.  Gently stretch your leg. Tylenol as needed.    Take care.  Glad to see you.

## 2021-04-24 DIAGNOSIS — M7060 Trochanteric bursitis, unspecified hip: Secondary | ICD-10-CM | POA: Insufficient documentation

## 2021-04-24 NOTE — Assessment & Plan Note (Signed)
Tetanus 2013 Shingles d/w pt.   PNA due at 66 Flu encouraged.   Covid vaccine encouraged.  Declined at this point. Prostate cancer screening and PSA options (with potential risks and benefits of testing vs not testing) were discussed along with recent recs/guidelines.  He declined testing PSA at this point. D/w patient [id number] for colon cancer screening, including IFOB vs. colonoscopy.  Risks and benefits of both were discussed and patient voiced understanding.  Pt elects for: IFOB.   Living will d/w pt.  Wife designated if patient were incapacitated.

## 2021-04-24 NOTE — Assessment & Plan Note (Signed)
Living will d/w pt.  Wife designated if patient were incapacitated.   ?

## 2021-04-24 NOTE — Assessment & Plan Note (Signed)
Recent imaging discussed.  Encouraged to consider statin start.  He declined.

## 2021-04-24 NOTE — Assessment & Plan Note (Signed)
Would ice frequently for 5 minutes at a time and then update me as needed.  Anatomy discussed with patient.  It could be that this affected his gait and that subsequently caused a calf strain.  No ominous findings.

## 2021-04-26 ENCOUNTER — Other Ambulatory Visit (INDEPENDENT_AMBULATORY_CARE_PROVIDER_SITE_OTHER): Payer: Self-pay

## 2021-04-26 DIAGNOSIS — Z1211 Encounter for screening for malignant neoplasm of colon: Secondary | ICD-10-CM

## 2021-04-26 LAB — FECAL OCCULT BLOOD, IMMUNOCHEMICAL: Fecal Occult Bld: NEGATIVE

## 2021-05-20 ENCOUNTER — Ambulatory Visit
Admission: EM | Admit: 2021-05-20 | Discharge: 2021-05-20 | Disposition: A | Discharge status: Home / Self Care | Payer: Self-pay | Attending: Emergency Medicine | Admitting: Emergency Medicine

## 2021-05-20 ENCOUNTER — Encounter: Payer: Self-pay | Admitting: Emergency Medicine

## 2021-05-20 ENCOUNTER — Other Ambulatory Visit: Payer: Self-pay

## 2021-05-20 ENCOUNTER — Ambulatory Visit: Payer: Self-pay

## 2021-05-20 DIAGNOSIS — J209 Acute bronchitis, unspecified: Secondary | ICD-10-CM

## 2021-05-20 DIAGNOSIS — Z1152 Encounter for screening for COVID-19: Secondary | ICD-10-CM

## 2021-05-20 MED ORDER — PREDNISONE 10 MG PO TABS: ORAL_TABLET | ORAL | 0 refills | Status: AC

## 2021-05-20 MED ORDER — DOXYCYCLINE HYCLATE 100 MG PO CAPS: 100.0000 mg | ORAL_CAPSULE | Freq: Two times a day (BID) | ORAL | 0 refills | Status: AC

## 2021-05-20 MED ORDER — ALBUTEROL SULFATE HFA 108 (90 BASE) MCG/ACT IN AERS: 1.0000 | INHALATION_SPRAY | Freq: Four times a day (QID) | RESPIRATORY_TRACT | 0 refills | Status: DC | PRN

## 2021-05-20 NOTE — ED Provider Notes (Signed)
CHIEF COMPLAINT:   Chief Complaint  Patient presents with   Nasal Congestion   Cough     SUBJECTIVE/HPI:   Cough A very pleasant 65 y.o.Male presents today with cough and congestion for the last 3 days. Patient reports a forceful cough productive of thick green mucus. He reports that his symptoms began to worsen last night. Has a history of bronchitis. Reports some rattling in the chest and a history of "bronchial issues". Negative at home COVID test. Has used tylenol and steam without much relief. Patient does not report any shortness of breath, chest pain, palpitations, visual changes, weakness, tingling, headache, nausea, vomiting, diarrhea, fever, chills.   has a past medical history of Cancer ([place]) (11/2004), Diverticulosis, H. pylori infection, dysplastic nevus (06/27/2010), and Melanoma ([place]).  ROS:  Review of Systems  Respiratory:  Positive for cough.   See Subjective/HPI Medications, Allergies and Problem List personally reviewed in Epic today OBJECTIVE:   Vitals:   05/20/21 0920  BP: 125/75  Pulse: 73  Resp: 18  Temp: 98.8 F (37.1 C)  SpO2: 96%    Physical Exam   General: Appears well-developed and well-nourished. No acute distress.  HEENT Head: Normocephalic and atraumatic Ears: Hearing grossly intact, no drainage or visible deformity.  Nose: No nasal deviation. Mouth/Throat: No stridor or tracheal deviation.  Non erythematous posterior pharynx noted with clear drainage present.  No white patchy exudate noted. Eyes: Conjunctivae and EOM are normal. No eye drainage or scleral icterus bilaterally.  Neck: Normal range of motion, neck is supple.  Cardiovascular: Normal rate. Regular rhythm; no murmurs, gallops, or rubs.  Pulm/Chest: No respiratory distress. Breath sounds normal bilaterally without wheezes, rhonchi, or rales.  Neurological: Alert and oriented to person, place, and time.  Skin: Skin is warm and dry.  No rashes, lesions, abrasions or bruising noted to  skin.   Psychiatric: Normal mood, affect, behavior, and thought content.   Vital signs and nursing note reviewed.   Patient stable and cooperative with examination. PROCEDURES:    LABS/X-RAYS/EKG/MEDS:   No results found for any visits on 05/20/21.  MEDICAL DECISION MAKING:   Patient presents with cough and congestion for the last 3 days. Patient reports a forceful cough productive of thick green mucus. He reports that his symptoms began to worsen last night. Has a history of bronchitis. Reports some rattling in the chest and a history of "bronchial issues". Negative at home COVID test. Has used tylenol and steam without much relief. Patient does not report any shortness of breath, chest pain, palpitations, visual changes, weakness, tingling, headache, nausea, vomiting, diarrhea, fever, chills. Chart review completed. Given symptoms and assessment findings, likely acute bronchitis. COVID PCR pending. Given timeframe, likely not bacterial at this time. Printed rx for doxycycline to use in the next 7-10 days if symptoms do not improve or worsen. Rx'd proair and prednisone to the patient's preferred pharmacy. Advised of at home treatment and care as outlined in his AVS. Return as needed.  Patient verbalized understanding and agreed with treatment plan.  Patient stable upon discharge. ASSESSMENT/PLAN:  1. Encounter for screening for severe acute respiratory syndrome coronavirus 2 (SARS-CoV-2) infection - Novel Coronavirus, NAA ([facility]); Standing - Novel Coronavirus, NAA ([facility])  2. Acute bronchitis, unspecified organism  Meds ordered this encounter  Medications   predniSONE (DELTASONE) 10 MG tablet    Sig: Take 6 tablets (60 mg total) by mouth daily for 1 day, THEN 5 tablets (50 mg total) daily for 1 day, THEN 4 tablets (40  mg total) daily for 1 day, THEN 3 tablets (30 mg total) daily for 1 day, THEN 2 tablets (20 mg total) daily for 1 day, THEN 1 tablet (10 mg total) daily for 1 day.     Dispense:  21 tablet    Refill:  0    Order Specific Question:   Supervising Provider    Answer:   Bari Mantis   albuterol (VENTOLIN HFA) 108 (90 Base) MCG/ACT inhaler    Sig: Inhale 1-2 puffs into the lungs every 6 (six) hours as needed for wheezing or shortness of breath.    Dispense:  6.7 g    Refill:  0    Order Specific Question:   Supervising Provider    Answer:   Chase Picket [id number]   doxycycline (VIBRAMYCIN) 100 MG capsule    Sig: Take 1 capsule (100 mg total) by mouth 2 (two) times daily for 10 days.    Dispense:  20 capsule    Refill:  0    Order Specific Question:   Supervising Provider    Answer:   Chase Picket [id number]     Instructions about new medications and side effects provided.  Plan:   Discharge Instructions      DO NOT fill the antibiotic script unless symptoms acutely worsen or symptoms persist for 7-10 days.  Rest, push lots of fluids (especially water), and utilize supportive care for symptoms. You may take take acetaminophen (Tylenol) every 4-6 hours or ibuprofen every 6-8 hours for muscle pain, joint pain, headaches. Mucinex (guaifenesin) may be taken over the counter for cough as needed and can loosen phlegm. Please read the instructions and take as directed. Saline nasal sprays to rinse congestion can help as well. Warm tea with lemon and honey can sooth sore throat and cough, as can cough drops.  The normal course of bronchitis is 1-3 weeks. Return to clinic for new-onset fever, difficulty breathing, chest pain, symptoms lasting >3 to 4 weeks, or bloody sputum.        A copy of these instructions have been given to the patient or responsible adult who demonstrated the ability to learn, asked appropriate questions, and verbalized understanding of the plan of care.  There were no barriers to learning identified.    Serafina Royals, FNP-C 05/20/21  This note was partially made with the aid of speech-to-text dictation;  typographical errors are not intentional.    Serafina Royals, [facility] 05/20/21 [phone]

## 2021-05-20 NOTE — Discharge Instructions (Addendum)
DO NOT fill the antibiotic script unless symptoms acutely worsen or symptoms persist for 7-10 days.  Rest, push lots of fluids (especially water), and utilize supportive care for symptoms. You may take take acetaminophen (Tylenol) every 4-6 hours or ibuprofen every 6-8 hours for muscle pain, joint pain, headaches. Mucinex (guaifenesin) may be taken over the counter for cough as needed and can loosen phlegm. Please read the instructions and take as directed. Saline nasal sprays to rinse congestion can help as well. Warm tea with lemon and honey can sooth sore throat and cough, as can cough drops.  The normal course of bronchitis is 1-3 weeks. Return to clinic for new-onset fever, difficulty breathing, chest pain, symptoms lasting >3 to 4 weeks, or bloody sputum.

## 2021-05-20 NOTE — ED Triage Notes (Signed)
Pt presents today with c/o of nasal congestion runny nose with productive (green mucous) x 3 days. Denies fever.   Home Covid test performed yesterday, negative.

## 2021-05-22 LAB — NOVEL CORONAVIRUS, NAA: SARS-CoV-2, NAA: NOT DETECTED

## 2021-05-22 LAB — SARS-COV-2, NAA 2 DAY TAT

## 2021-10-17 ENCOUNTER — Ambulatory Visit (INDEPENDENT_AMBULATORY_CARE_PROVIDER_SITE_OTHER): Payer: Self-pay | Admitting: Dermatology

## 2021-10-17 ENCOUNTER — Other Ambulatory Visit: Payer: Self-pay

## 2021-10-17 DIAGNOSIS — L814 Other melanin hyperpigmentation: Secondary | ICD-10-CM

## 2021-10-17 DIAGNOSIS — Z1283 Encounter for screening for malignant neoplasm of skin: Secondary | ICD-10-CM

## 2021-10-17 DIAGNOSIS — Z872 Personal history of diseases of the skin and subcutaneous tissue: Secondary | ICD-10-CM

## 2021-10-17 DIAGNOSIS — Z85820 Personal history of malignant melanoma of skin: Secondary | ICD-10-CM

## 2021-10-17 DIAGNOSIS — Z86018 Personal history of other benign neoplasm: Secondary | ICD-10-CM

## 2021-10-17 DIAGNOSIS — L821 Other seborrheic keratosis: Secondary | ICD-10-CM

## 2021-10-17 DIAGNOSIS — L578 Other skin changes due to chronic exposure to nonionizing radiation: Secondary | ICD-10-CM

## 2021-10-17 DIAGNOSIS — D1800 Hemangioma unspecified site: Secondary | ICD-10-CM

## 2021-10-17 DIAGNOSIS — D229 Melanocytic nevi, unspecified: Secondary | ICD-10-CM

## 2021-10-17 NOTE — Patient Instructions (Signed)

## 2021-10-17 NOTE — Progress Notes (Signed)
Follow-Up Visit   Subjective  Charles Collins is a 65 y.o. male who presents for the following: Annual Exam (Mole check). Hx of Melanoma. Hx of Dysplastic nevus. The patient presents for Total-Body Skin Exam (TBSE) for skin cancer screening and mole check.  The patient has spots, moles and lesions to be evaluated, some may be new or changing and the patient has concerns that these could be cancer.   The following portions of the chart were reviewed this encounter and updated as appropriate:   Tobacco   Allergies   Meds   Problems   Med Hx   Surg Hx   Fam Hx      Review of Systems:  No other skin or systemic complaints except as noted in HPI or Assessment and Plan.  Objective  Well appearing patient in no apparent distress; mood and affect are within normal limits.  A full examination was performed including scalp, head, eyes, ears, nose, lips, neck, chest, axillae, abdomen, back, buttocks, bilateral upper extremities, bilateral lower extremities, hands, feet, fingers, toes, fingernails, and toenails. All findings within normal limits unless otherwise noted below.  face Clear skin     Assessment & Plan  History of actinic keratosis face Actinic keratoses are precancerous spots that appear secondary to cumulative UV radiation exposure/sun exposure over time. They are chronic with expected duration over 1 year. A portion of actinic keratoses will progress to squamous cell carcinoma of the skin. It is not possible to reliably predict which spots will progress to skin cancer and so treatment is recommended to prevent development of skin cancer.  Recommend daily broad spectrum sunscreen SPF 30+ to sun-exposed areas, reapply every 2 hours as needed.  Recommend staying in the shade or wearing long sleeves, sun glasses (UVA+UVB protection) and wide brim hats (4-inch brim around the entire circumference of the hat). Call for new or changing lesions.   Skin cancer screening  Lentigines -  Scattered tan macules - Due to sun exposure - Benign-appearing, observe - Recommend daily broad spectrum sunscreen SPF 30+ to sun-exposed areas, reapply every 2 hours as needed. - Call for any changes  Seborrheic Keratoses - Stuck-on, waxy, tan-brown papules and/or plaques  - Benign-appearing - Discussed benign etiology and prognosis. - Observe - Call for any changes  Melanocytic Nevi - Tan-brown and/or pink-flesh-colored symmetric macules and papules - Benign appearing on exam today - Observation - Call clinic for new or changing moles - Recommend daily use of broad spectrum spf 30+ sunscreen to sun-exposed areas.   Hemangiomas - Red papules - Discussed benign nature - Observe - Call for any changes  Actinic Damage - Chronic condition, secondary to cumulative UV/sun exposure - diffuse scaly erythematous macules with underlying dyspigmentation - Recommend daily broad spectrum sunscreen SPF 30+ to sun-exposed areas, reapply every 2 hours as needed.  - Staying in the shade or wearing long sleeves, sun glasses (UVA+UVB protection) and wide brim hats (4-inch brim around the entire circumference of the hat) are also recommended for sun protection.  - Call for new or changing lesions.  History of Dysplastic Nevi Left dorsum mid lat foot  - No evidence of recurrence today - Recommend regular full body skin exams - Recommend daily broad spectrum sunscreen SPF 30+ to sun-exposed areas, reapply every 2 hours as needed.  - Call if any new or changing lesions are noted between office visits   History of Melanoma Right knee - No evidence of recurrence today - No lymphadenopathy -  Recommend regular full body skin exams - Recommend daily broad spectrum sunscreen SPF 30+ to sun-exposed areas, reapply every 2 hours as needed.  - Call if any new or changing lesions are noted between office visits   Skin cancer screening performed today.   Return in about 1 year (around 10/17/2022) for  TBSE, Hx of Melanoma .  IMarye Round, CMA, am acting as scribe for Sarina Ser, MD .  Documentation: I have reviewed the above documentation for accuracy and completeness, and I agree with the above.  Sarina Ser, MD

## 2021-10-22 ENCOUNTER — Encounter: Payer: Self-pay | Admitting: Dermatology

## 2021-11-27 DIAGNOSIS — M7062 Trochanteric bursitis, left hip: Secondary | ICD-10-CM | POA: Insufficient documentation

## 2022-04-06 ENCOUNTER — Other Ambulatory Visit: Payer: Self-pay | Admitting: Family Medicine

## 2022-04-06 DIAGNOSIS — E785 Hyperlipidemia, unspecified: Secondary | ICD-10-CM

## 2022-04-17 ENCOUNTER — Other Ambulatory Visit: Payer: Medicare Other

## 2022-04-18 ENCOUNTER — Other Ambulatory Visit (INDEPENDENT_AMBULATORY_CARE_PROVIDER_SITE_OTHER): Payer: Medicare Other

## 2022-04-18 DIAGNOSIS — E785 Hyperlipidemia, unspecified: Secondary | ICD-10-CM | POA: Diagnosis not present

## 2022-04-18 LAB — LIPID PANEL
Cholesterol: 178 mg/dL (ref 0–200)
HDL: 42.9 mg/dL (ref >39.00)
LDL Cholesterol: 115 mg/dL — ABNORMAL HIGH (ref 0–99)
NonHDL: 134.7
Total CHOL/HDL Ratio: 4
Triglycerides: 97 mg/dL (ref 0.0–149.0)
VLDL: 19.4 mg/dL (ref 0.0–40.0)

## 2022-04-18 LAB — COMPREHENSIVE METABOLIC PANEL
ALT: 17 U/L (ref 0–53)
AST: 22 U/L (ref 0–37)
Albumin: 4 g/dL (ref 3.5–5.2)
Alkaline Phosphatase: 77 U/L (ref 39–117)
BUN: 13 mg/dL (ref 6–23)
CO2: 27 mEq/L (ref 19–32)
Calcium: 9.2 mg/dL (ref 8.4–10.5)
Chloride: 104 mEq/L (ref 96–112)
Creatinine, Ser: 0.98 mg/dL (ref 0.40–1.50)
GFR: 81.01 mL/min (ref >60.00)
Glucose, Bld: 89 mg/dL (ref 70–99)
Potassium: 4.6 mEq/L (ref 3.5–5.1)
Sodium: 139 mEq/L (ref 135–145)
Total Bilirubin: 0.5 mg/dL (ref 0.2–1.2)
Total Protein: 7.1 g/dL (ref 6.0–8.3)

## 2022-04-19 ENCOUNTER — Ambulatory Visit
Admission: RE | Admit: 2022-04-19 | Discharge: 2022-04-19 | Disposition: A | Discharge status: Home / Self Care | Payer: Medicare Other | Source: Ambulatory Visit | Attending: Emergency Medicine | Admitting: Emergency Medicine

## 2022-04-19 ENCOUNTER — Other Ambulatory Visit: Payer: Self-pay

## 2022-04-19 VITALS — BP 108/70 | HR 75 | Temp 99.0°F | Resp 18

## 2022-04-19 DIAGNOSIS — J209 Acute bronchitis, unspecified: Secondary | ICD-10-CM | POA: Diagnosis not present

## 2022-04-19 MED ORDER — PREDNISONE 10 MG (21) PO TBPK: ORAL_TABLET | Freq: Every day | ORAL | 0 refills | Status: DC

## 2022-04-19 MED ORDER — DOXYCYCLINE HYCLATE 100 MG PO CAPS: 100.0000 mg | ORAL_CAPSULE | Freq: Two times a day (BID) | ORAL | 0 refills | Status: DC

## 2022-04-19 NOTE — Discharge Instructions (Addendum)
Continue to use your albuterol inhaler as directed.  Take the prednisone and doxycycline as directed.  Follow up with your primary care provider if your symptoms are not improving.

## 2022-04-19 NOTE — ED Triage Notes (Signed)
Wednesday, symptoms.  Coughing up green phlegm.  Similar to episode he had last year in July.  Drainage in back of throat.  No headache.  Intermittently ears have hurt.  Covid test negative.

## 2022-04-19 NOTE — ED Provider Notes (Signed)
[facility]    CSN: [id number] Arrival date & time: 04/19/22  0845      History   Chief Complaint Chief Complaint  Patient presents with   Cough   Appointment    9:00    HPI Charles Collins is a 66 y.o. male.  Patient presents with 3-day history of cough productive of green mucus and congestion.  He reports history of bronchitis with similar symptoms.  He had ear pain and sore throat yesterday but these have resolved.  No fever, rash, chest pain, shortness of breath, vomiting, diarrhea, or other symptoms.  Treatment at home with Tylenol and albuterol inhaler that was prescribed last year.  He states the albuterol is in date and has plenty of activations left.  The history is provided by the patient and medical records.    Past Medical History:  Diagnosis Date   Cancer ([place]) 11/2004   Melanoma, R Knee by Dr Nehemiah Massed   Diverticulosis    DIVERTICULOSIS ISCHEMIC COLITIS VIA COLONOSCOPY(DR. SEIGAL):(01/2001)   H. pylori infection    Hx of dysplastic nevus 06/27/2010   L dorsum mid lat. 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   HLD (hyperlipidemia) 04/09/2020   Cough 09/15/2018   Advance care planning 03/27/2015   Routine general medical examination at a health care facility 03/18/2012   Plantar fasciitis 03/12/2011   DIVERTICULAR DISEASE 12/20/2007   MELANOMA, HX OF 12/17/2007    Past Surgical History:  Procedure Laterality Date   APPENDECTOMY     As Teen   ESOPHAGOGASTRODUODENOSCOPY (EGD) WITH PROPOFOL N/A 04/10/2020   Procedure: ESOPHAGOGASTRODUODENOSCOPY (EGD) WITH PROPOFOL;  Surgeon: Lin Landsman, MD;  Location: [facility];  Service: Gastroenterology;  Laterality: N/A;   KNEE SURGERY     Open Repair Right as Teen   MELANOMA EXCISION Right 2006   Knee. Clark's level IV, Breslow's 0.74m       Home Medications     Prior to Admission medications   Medication Sig Start Date End Date Taking? Authorizing Provider  doxycycline (VIBRAMYCIN) 100 MG capsule Take 1 capsule (100 mg total) by mouth 2 (two) times daily for 7 days. 04/19/22 04/26/22 Yes TSharion Balloon NP  predniSONE (STERAPRED UNI-PAK 21 TAB) 10 MG (21) TBPK tablet Take by mouth daily. As directed 04/19/22  Yes TSharion Balloon NP  albuterol (VENTOLIN HFA) 108 (90 Base) MCG/ACT inhaler Inhale 1-2 puffs into the lungs every 6 (six) hours as needed for wheezing or shortness of breath. 05/20/21   Boddu, KErasmo Downer FNP  Multiple Vitamin (MULTIVITAMIN) tablet Take 1 tablet by mouth daily.    [provider]    Family History Family History  Problem Relation Age of Onset   Stroke Mother 351      Cause of Death, [place]   Diabetes Father    Hypertension Father    Obesity Father    Kidney disease Father    Kidney cancer Father    Pancreatic cancer Father        presumed, no biopsy done   Hypertension Paternal Grandfather    Heart disease Neg Hx    Cancer Neg Hx    Depression Neg Hx    Drug abuse Neg Hx    Alcohol abuse Neg Hx    Colon cancer Neg Hx    Prostate cancer  Neg Hx     Social History Social History   Tobacco Use   Smoking status: Former    Packs/day: 0.02    Years: 2.00    Total pack years: 0.04    Types: Cigarettes    Quit date: 11/03/1968    Years since quitting: 53.4   Smokeless tobacco: Current    Types: Chew   Tobacco comments:    quit over 30 years ago, smoked briefly as a teenager  Vaping Use   Vaping Use: Never used  Substance Use Topics   Alcohol use: No    Alcohol/week: 0.0 standard drinks of alcohol   Drug use: No     Allergies   Diclofenac, Nsaids, and Prilosec [omeprazole]   Review of Systems Review of Systems  Constitutional:  Negative for chills and fever.  HENT:  Positive for congestion, ear pain and sore throat. Negative for postnasal drip.   Respiratory:  Positive for cough. Negative  for shortness of breath.   Cardiovascular:  Negative for chest pain and palpitations.  Gastrointestinal:  Negative for diarrhea and vomiting.  Skin:  Negative for color change and rash.  All other systems reviewed and are negative.    Physical Exam Triage Vital Signs ED Triage Vitals  Enc Vitals Group     BP      Pulse      Resp      Temp      Temp src      SpO2      Weight      Height      Head Circumference      Peak Flow      Pain Score      Pain Loc      Pain Edu?      Excl. in [place]?    No data found.  Updated Vital Signs BP 108/70 (BP Location: Left Arm)   Pulse 75   Temp 99 F (37.2 C) (Oral)   Resp 18   SpO2 96%   Visual Acuity Right Eye Distance:   Left Eye Distance:   Bilateral Distance:    Right Eye Near:   Left Eye Near:    Bilateral Near:     Physical Exam Vitals and nursing note reviewed.  Constitutional:      General: He is not in acute distress.    Appearance: Normal appearance. He is well-developed. He is not ill-appearing.  HENT:     Right Ear: Tympanic membrane normal.     Left Ear: Tympanic membrane normal.     Nose: Nose normal.     Mouth/Throat:     Mouth: Mucous membranes are moist.     Pharynx: Oropharynx is clear.     Comments: Clear postnasal drip. Cardiovascular:     Rate and Rhythm: Normal rate and regular rhythm.     Heart sounds: Normal heart sounds.  Pulmonary:     Effort: Pulmonary effort is normal. No respiratory distress.     Breath sounds: Normal breath sounds. No wheezing or rhonchi.  Musculoskeletal:     Cervical back: Neck supple.  Skin:    General: Skin is warm and dry.  Neurological:     Mental Status: He is alert.  Psychiatric:        Mood and Affect: Mood normal.        Behavior: Behavior normal.      UC Treatments / Results  Labs (all labs ordered are listed, but only abnormal results are displayed)  Labs Reviewed - No data to display  EKG   Radiology No results  found.  Procedures Procedures (including critical care time)  Medications Ordered in UC Medications - No data to display  Initial Impression / Assessment and Plan / UC Course  I have reviewed the triage vital signs and the nursing notes.  Pertinent labs & imaging results that were available during my care of the patient were reviewed by me and considered in my medical decision making (see chart for details).  Acute bronchitis.  Patient has been using his albuterol inhaler that was prescribed last summer.  Treating today with prednisone and doxycycline which have been successful for the patient in the past.  Instructed him to follow-up with his PCP if his symptoms are not improving.  ED precautions discussed.  Education provided on bronchitis.  Patient agrees to plan of care.   Final Clinical Impressions(s) / UC Diagnoses   Final diagnoses:  Acute bronchitis, unspecified organism     Discharge Instructions      Continue to use your albuterol inhaler as directed.  Take the prednisone and doxycycline as directed.  Follow up with your primary care provider if your symptoms are not improving.        ED Prescriptions     Medication Sig Dispense Auth. Provider   predniSONE (STERAPRED UNI-PAK 21 TAB) 10 MG (21) TBPK tablet Take by mouth daily. As directed 21 tablet Sharion Balloon, NP   doxycycline (VIBRAMYCIN) 100 MG capsule Take 1 capsule (100 mg total) by mouth 2 (two) times daily for 7 days. 14 capsule Sharion Balloon, NP      PDMP not reviewed this encounter.   Sharion Balloon, NP 04/19/22 [phone]

## 2022-04-21 ENCOUNTER — Emergency Department: Payer: Medicare Other

## 2022-04-21 ENCOUNTER — Encounter: Payer: Self-pay | Admitting: Emergency Medicine

## 2022-04-21 ENCOUNTER — Emergency Department
Admission: EM | Admit: 2022-04-21 | Discharge: 2022-04-21 | Disposition: A | Discharge status: Home / Self Care | Payer: Medicare Other | Attending: Emergency Medicine | Admitting: Emergency Medicine

## 2022-04-21 ENCOUNTER — Other Ambulatory Visit: Payer: Self-pay

## 2022-04-21 ENCOUNTER — Telehealth: Payer: Self-pay | Admitting: Family Medicine

## 2022-04-21 DIAGNOSIS — R1032 Left lower quadrant pain: Secondary | ICD-10-CM | POA: Diagnosis present

## 2022-04-21 DIAGNOSIS — K5732 Diverticulitis of large intestine without perforation or abscess without bleeding: Secondary | ICD-10-CM | POA: Diagnosis not present

## 2022-04-21 DIAGNOSIS — K5792 Diverticulitis of intestine, part unspecified, without perforation or abscess without bleeding: Secondary | ICD-10-CM

## 2022-04-21 DIAGNOSIS — Z85820 Personal history of malignant melanoma of skin: Secondary | ICD-10-CM | POA: Diagnosis not present

## 2022-04-21 DIAGNOSIS — D72829 Elevated white blood cell count, unspecified: Secondary | ICD-10-CM | POA: Diagnosis not present

## 2022-04-21 LAB — CBC WITH DIFFERENTIAL/PLATELET
Abs Immature Granulocytes: 0.08 10*3/uL — ABNORMAL HIGH (ref 0.00–0.07)
Basophils Absolute: 0 10*3/uL (ref 0.0–0.1)
Basophils Relative: 0 %
Eosinophils Absolute: 0 10*3/uL (ref 0.0–0.5)
Eosinophils Relative: 0 %
HCT: 46.3 % (ref 39.0–52.0)
Hemoglobin: 14.7 g/dL (ref 13.0–17.0)
Immature Granulocytes: 1 %
Lymphocytes Relative: 9 %
Lymphs Abs: 1.5 10*3/uL (ref 0.7–4.0)
MCH: 29.5 pg (ref 26.0–34.0)
MCHC: 31.7 g/dL (ref 30.0–36.0)
MCV: 93 fL (ref 80.0–100.0)
Monocytes Absolute: 0.9 10*3/uL (ref 0.1–1.0)
Monocytes Relative: 6 %
Neutro Abs: 13.7 10*3/uL — ABNORMAL HIGH (ref 1.7–7.7)
Neutrophils Relative %: 84 %
Platelets: 311 10*3/uL (ref 150–400)
RBC: 4.98 MIL/uL (ref 4.22–5.81)
RDW: 12.7 % (ref 11.5–15.5)
WBC: 16.3 10*3/uL — ABNORMAL HIGH (ref 4.0–10.5)
nRBC: 0 % (ref 0.0–0.2)

## 2022-04-21 LAB — COMPREHENSIVE METABOLIC PANEL
ALT: 42 U/L (ref 0–44)
AST: 44 U/L — ABNORMAL HIGH (ref 15–41)
Albumin: 3.9 g/dL (ref 3.5–5.0)
Alkaline Phosphatase: 74 U/L (ref 38–126)
Anion gap: 9 (ref 5–15)
BUN: 14 mg/dL (ref 8–23)
CO2: 24 mmol/L (ref 22–32)
Calcium: 9.2 mg/dL (ref 8.9–10.3)
Chloride: 104 mmol/L (ref 98–111)
Creatinine, Ser: 0.88 mg/dL (ref 0.61–1.24)
GFR, Estimated: >60 mL/min (ref >60)
Glucose, Bld: 104 mg/dL — ABNORMAL HIGH (ref 70–99)
Potassium: 4.3 mmol/L (ref 3.5–5.1)
Sodium: 137 mmol/L (ref 135–145)
Total Bilirubin: 1 mg/dL (ref 0.3–1.2)
Total Protein: 7.6 g/dL (ref 6.5–8.1)

## 2022-04-21 LAB — URINALYSIS, ROUTINE W REFLEX MICROSCOPIC
Bilirubin Urine: NEGATIVE
Glucose, UA: NEGATIVE mg/dL
Hgb urine dipstick: NEGATIVE
Ketones, ur: NEGATIVE mg/dL
Leukocytes,Ua: NEGATIVE
Nitrite: NEGATIVE
Protein, ur: NEGATIVE mg/dL
Specific Gravity, Urine: 1.017 (ref 1.005–1.030)
pH: 7 (ref 5.0–8.0)

## 2022-04-21 LAB — LIPASE, BLOOD: Lipase: 23 U/L (ref 11–51)

## 2022-04-21 LAB — TROPONIN I (HIGH SENSITIVITY): Troponin I (High Sensitivity): 4 ng/L (ref <18)

## 2022-04-21 MED ORDER — MORPHINE SULFATE (PF) 4 MG/ML IV SOLN
4.0000 mg | Freq: Once | INTRAVENOUS | Status: AC
  Administered 2022-04-21: 4 mg via INTRAVENOUS
  Filled 2022-04-21: qty 1

## 2022-04-21 MED ORDER — IOHEXOL 300 MG/ML  SOLN
100.0000 mL | Freq: Once | INTRAMUSCULAR | Status: AC | PRN
  Administered 2022-04-21: 100 mL via INTRAVENOUS

## 2022-04-21 MED ORDER — ONDANSETRON HCL 4 MG/2ML IJ SOLN
4.0000 mg | Freq: Once | INTRAMUSCULAR | Status: AC
  Administered 2022-04-21: 4 mg via INTRAVENOUS
  Filled 2022-04-21: qty 2

## 2022-04-21 MED ORDER — AMOXICILLIN-POT CLAVULANATE 875-125 MG PO TABS: 1.0000 | ORAL_TABLET | Freq: Two times a day (BID) | ORAL | 0 refills | Status: AC

## 2022-04-21 NOTE — ED Notes (Signed)
Dc ppw provided to pt. rx info, follow up and questiosns addressed. pt declines vs at time of dc and provides verbal consent for dc. pt ambulatory off unit.

## 2022-04-21 NOTE — Discharge Instructions (Signed)
Take the antibiotics as prescribed return the emergency department if you develop worsening pain, fever, chills, or any other concerns.  Please complete the entire course of antibiotics even if you feel better sooner.  Please follow-up with your outpatient provider.

## 2022-04-21 NOTE — Telephone Encounter (Signed)
Pt called and said he was seen at urgent care a few days ago and they prescribed him a couple of medications and now he is having severe lower abdominal pain. I transferred pt to the triage line for further eval

## 2022-04-21 NOTE — Telephone Encounter (Signed)
Per chart review note pt is presently at [facility] ED. Sending note to Dr Damita Dunnings and Janett Billow CMA.

## 2022-04-21 NOTE — Telephone Encounter (Signed)
[facility] - Client TELEPHONE ADVICE RECORD [facility] Patient Name: Charles Collins Gender: Male DOB: [birth year] Age: 66 Y 19 M 9 D Return Phone Number: [phone] (Primary) Address: City/ State/ Zip: [place]  [address] Client [facility] - Client Client Site [facility] Provider Renford Dills - MD Contact Type Call Who Is Calling Patient / Member / Family / Caregiver Call Type Triage / Clinical Relationship To Patient Self Return Phone Number [phone] (Primary) Chief Complaint SEVERE ABDOMINAL PAIN - Severe pain in abdomen Reason for Call Symptomatic / Request for [place] states he was in the urgent care the other day and he is currently having severe abdominal pain. Translation No Nurse Assessment Nurse: Humfleet, RN, Estill Bamberg Date/Time (Eastern Time): 04/21/2022 2:11:46 PM Confirm and document reason for call. If symptomatic, describe symptoms. ---caller states he was seen at [facility] sat for bronchitis. has doxy, steroid, inhaler. this morning woke with severe abd pain. lower, middle. no fever. Does the patient have any new or worsening symptoms? ---Yes Will a triage be completed? ---Yes Related visit to physician within the last 2 weeks? ---Yes Does the PT have any chronic conditions? (i.e. diabetes, asthma, this includes High risk factors for pregnancy, etc.) ---Yes List chronic conditions. ---diverticulitis Is this a behavioral health or substance abuse call? ---No Guidelines Guideline Title Affirmed Question Affirmed Notes Nurse Date/Time (Eastern Time) Abdominal Pain - Male [1] SEVERE pain (e.g., excruciating) AND [2] present > 1 hour Humfleet, RN, Estill Bamberg 04/21/2022 2:12:44 PM Disp. Time Eilene Ghazi Time) Disposition Final User 04/21/2022 2:10:41 PM Send to Urgent Queue Jake Seats, Anderson [place] PLEASE NOTE: All timestamps contained  within this report are represented as [place] Standard Time. CONFIDENTIALTY NOTICE: This fax transmission is intended only for the addressee. It contains information that is legally privileged, confidential or otherwise protected from use or disclosure. If you are not the intended recipient, you are strictly prohibited from reviewing, disclosing, copying using or disseminating any of this information or taking any action in reliance on or regarding this information. If you have received this fax in error, please notify us immediately by telephone so that we can arrange for its return to [place]. Phone: [phone], Toll-Free: [phone], Fax: [phone] Page: 2 of 2 Call Id: [id number] 04/21/2022 2:16:28 PM Go to ED Now Yes Humfleet, RN, Shelly Coss Disagree/Comply Comply Caller Understands Yes PreDisposition Did not know what to do Care Advice Given Per Guideline GO TO ED NOW: * You need to be seen in the Emergency Department. * Go to the ED at ___________ [place] given per Abdominal Pain - Male (Adult) guideline. * It is better and safer if another adult drives instead of you. ANOTHER ADULT SHOULD DRIVE: * Do not eat or drink anything for now. NOTHING BY MOUTH: Comments User: Rozelle Logan, RN Date/Time Eilene Ghazi Time): 04/21/2022 2:17:35 PM has appointment coming up for an aneurysm check Referrals [place]

## 2022-04-21 NOTE — ED Provider Notes (Signed)
[facility] Provider Note    Event Date/Time   First MD Initiated Contact with Patient 04/21/22 1809     (approximate)   History   Abdominal Pain   HPI  Charles Collins is a 66 y.o. male with a past medical history of hyperlipidemia, diverticular disease who presents today for evaluation of abdominal pain that began at approximately 6 AM this morning.  Patient reports that his pain is primarily in his left lower quadrant area and radiates to the middle of his abdomen.  He reports that he has had several loose stools today, though no bloody stool.  He denies fevers or chills.  He reports that he feels slightly nauseated but is not had any vomiting.  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   HLD (hyperlipidemia) 04/09/2020   Cough 09/15/2018   Advance care planning 03/27/2015   Routine general medical examination at a health care facility 03/18/2012   Plantar fasciitis 03/12/2011   DIVERTICULAR DISEASE 12/20/2007   MELANOMA, HX OF 12/17/2007          Physical Exam   Triage Vital Signs: ED Triage Vitals [04/21/22 1532]  Enc Vitals Group     BP 104/65     Pulse Rate 66     Resp 20     Temp 98.2 F (36.8 C)     Temp Source Oral     SpO2 98 %     Weight 175 lb (79.4 kg)     Height '5\' 8"'$  (1.727 m)     Head Circumference      Peak Flow      Pain Score 8     Pain Loc      Pain Edu?      Excl. in [place]?     Most recent vital signs: Vitals:   04/21/22 1532 04/21/22 1841  BP: 104/65 110/60  Pulse: 66 68  Resp: 20 18  Temp: 98.2 F (36.8 C)   SpO2: 98% 99%    Physical Exam Vitals and nursing note reviewed.  Constitutional:      General: Awake and alert. No acute distress.    Appearance: Normal appearance. He is well-developed and normal weight.  HENT:     Head: Normocephalic and atraumatic.     Mouth/Throat:     Mouth: Mucous membranes are  moist.  Eyes:     General: PERRL. Normal EOMs        Right eye: No discharge.        Left eye: No discharge.     Conjunctiva/sclera: Conjunctivae normal.  Cardiovascular:     Rate and Rhythm: Normal rate and regular rhythm.     Pulses: Normal pulses.     Heart sounds: Normal heart sounds Pulmonary:     Effort: Pulmonary effort is normal. No respiratory distress.     Breath sounds: Normal breath sounds.  Abdominal:     Abdomen is soft. There is left lower quadrant abdominal tenderness. No rebound or guarding. No distention. Musculoskeletal:        General: No swelling. Normal range of motion.     Cervical back: Normal range of motion and neck supple.  Skin:    General: Skin is warm and dry.     Capillary Refill: Capillary refill takes less than 2 seconds.     Findings: No rash.  Neurological:     Mental Status: He  is alert.      ED Results / Procedures / Treatments   Labs (all labs ordered are listed, but only abnormal results are displayed) Labs Reviewed  COMPREHENSIVE METABOLIC PANEL - Abnormal; Notable for the following components:      Result Value   Glucose, Bld 104 (*)    AST 44 (*)    All other components within normal limits  CBC WITH DIFFERENTIAL/PLATELET - Abnormal; Notable for the following components:   WBC 16.3 (*)    Neutro Abs 13.7 (*)    Abs Immature Granulocytes 0.08 (*)    All other components within normal limits  URINALYSIS, ROUTINE W REFLEX MICROSCOPIC - Abnormal; Notable for the following components:   Color, Urine YELLOW (*)    APPearance CLEAR (*)    All other components within normal limits  LIPASE, BLOOD  TROPONIN I (HIGH SENSITIVITY)  TROPONIN I (HIGH SENSITIVITY)     EKG     RADIOLOGY I independently reviewed and interpreted imaging and agree with radiologists findings.     PROCEDURES:  Critical Care performed:   Procedures   MEDICATIONS ORDERED IN ED: Medications  morphine (PF) 4 MG/ML injection 4 mg (4 mg Intravenous  Given 04/21/22 1838)  ondansetron (ZOFRAN) injection 4 mg (4 mg Intravenous Given 04/21/22 1838)  iohexol (OMNIPAQUE) 300 MG/ML solution 100 mL (100 mLs Intravenous Contrast Given 04/21/22 1615)     IMPRESSION / MDM / ASSESSMENT AND PLAN / ED COURSE  I reviewed the triage vital signs and the nursing notes.   Differential diagnosis includes, but is not limited to, diverticulitis, appendicitis, gastroenteritis.  Patient is awake and alert, hemodynamically stable and afebrile.  Labs obtained in triage demonstrate a leukocytosis to 16.  CT scan shows findings consistent with acute sigmoid diverticulitis without perforation or abscess.  Patient was treated symptomatically with morphine and Zofran.  He was started on outpatient antibiotics.  Discussed the importance of course completion.  We also discussed strict return precautions.  Recommended close outpatient follow-up for recheck and assurance of resolution.  Patient understands and agrees with plan.  Discharged in the care of his wife.   Patient's presentation is most consistent with acute presentation with potential threat to life or bodily function.     FINAL CLINICAL IMPRESSION(S) / ED DIAGNOSES   Final diagnoses:  Diverticulitis     Rx / DC Orders   ED Discharge Orders          Ordered    amoxicillin-clavulanate (AUGMENTIN) 875-125 MG tablet  2 times daily        04/21/22 1821             Note:  This document was prepared using Dragon voice recognition software and may include unintentional dictation errors.   Emeline Gins 04/21/22 1904    Lucrezia Starch, MD 04/21/22 2242

## 2022-04-21 NOTE — ED Triage Notes (Signed)
Pt in with co mid abd pain that radiates up to epigastric area. Pt has had some difficulty urinating, no fever or nausea.

## 2022-04-21 NOTE — ED Provider Triage Note (Signed)
Emergency Medicine Provider Triage Evaluation Note  BREKKEN BEACH , a 66 y.o. male  was evaluated in triage.  Pt complains of abdominal pain.  Review of Systems  Positive: Abdominal pain Negative: Fever chills  Physical Exam  BP 104/65 (BP Location: Left Arm)   Pulse 66   Temp 98.2 F (36.8 C) (Oral)   Resp 20   Ht '5\' 8"'$  (1.727 m)   Wt 79.4 kg   SpO2 98%   BMI 26.61 kg/m  Gen:   Awake, no distress   Resp:  Normal effort  MSK:   Moves extremities without difficulty  Other:  Abdomen is tender to palpation  Medical Decision Making  Medically screening exam initiated at 3:37 PM.  Appropriate orders placed.  Taiden Raybourn Brick was informed that the remainder of the evaluation will be completed by another provider, this initial triage assessment does not replace that evaluation, and the importance of remaining in the ED until their evaluation is complete.  Abdominal pain protocols along with CT abdomen pelvis initiated   Versie Starks, PA-C 04/21/22 1538

## 2022-04-22 ENCOUNTER — Encounter (INDEPENDENT_AMBULATORY_CARE_PROVIDER_SITE_OTHER): Payer: Self-pay | Admitting: Vascular Surgery

## 2022-04-22 ENCOUNTER — Telehealth: Payer: Self-pay | Admitting: Family Medicine

## 2022-04-22 ENCOUNTER — Ambulatory Visit (INDEPENDENT_AMBULATORY_CARE_PROVIDER_SITE_OTHER): Payer: Medicare Other

## 2022-04-22 ENCOUNTER — Other Ambulatory Visit: Payer: Self-pay

## 2022-04-22 ENCOUNTER — Telehealth: Payer: Self-pay

## 2022-04-22 ENCOUNTER — Ambulatory Visit (INDEPENDENT_AMBULATORY_CARE_PROVIDER_SITE_OTHER): Payer: Medicare Other | Admitting: Vascular Surgery

## 2022-04-22 ENCOUNTER — Encounter: Payer: Self-pay | Admitting: Emergency Medicine

## 2022-04-22 ENCOUNTER — Emergency Department
Admission: EM | Admit: 2022-04-22 | Discharge: 2022-04-22 | Disposition: A | Discharge status: Home / Self Care | Payer: Medicare Other | Attending: Emergency Medicine | Admitting: Emergency Medicine

## 2022-04-22 VITALS — BP 107/70 | HR 77 | Resp 16 | Wt 165.0 lb

## 2022-04-22 DIAGNOSIS — K5792 Diverticulitis of intestine, part unspecified, without perforation or abscess without bleeding: Secondary | ICD-10-CM

## 2022-04-22 DIAGNOSIS — D72829 Elevated white blood cell count, unspecified: Secondary | ICD-10-CM | POA: Diagnosis not present

## 2022-04-22 DIAGNOSIS — R112 Nausea with vomiting, unspecified: Secondary | ICD-10-CM | POA: Diagnosis present

## 2022-04-22 DIAGNOSIS — Z85820 Personal history of malignant melanoma of skin: Secondary | ICD-10-CM | POA: Diagnosis not present

## 2022-04-22 DIAGNOSIS — K5730 Diverticulosis of large intestine without perforation or abscess without bleeding: Secondary | ICD-10-CM

## 2022-04-22 DIAGNOSIS — R000 Tachycardia, unspecified: Secondary | ICD-10-CM | POA: Insufficient documentation

## 2022-04-22 DIAGNOSIS — I728 Aneurysm of other specified arteries: Secondary | ICD-10-CM

## 2022-04-22 DIAGNOSIS — R059 Cough, unspecified: Secondary | ICD-10-CM | POA: Insufficient documentation

## 2022-04-22 DIAGNOSIS — K5732 Diverticulitis of large intestine without perforation or abscess without bleeding: Secondary | ICD-10-CM | POA: Diagnosis not present

## 2022-04-22 DIAGNOSIS — R824 Acetonuria: Secondary | ICD-10-CM | POA: Insufficient documentation

## 2022-04-22 DIAGNOSIS — R809 Proteinuria, unspecified: Secondary | ICD-10-CM | POA: Insufficient documentation

## 2022-04-22 LAB — URINALYSIS, ROUTINE W REFLEX MICROSCOPIC
Bacteria, UA: NONE SEEN
Bilirubin Urine: NEGATIVE
Glucose, UA: NEGATIVE mg/dL
Hgb urine dipstick: NEGATIVE
Ketones, ur: 20 mg/dL — AB
Leukocytes,Ua: NEGATIVE
Nitrite: NEGATIVE
Protein, ur: 30 mg/dL — AB
Specific Gravity, Urine: 1.03 (ref 1.005–1.030)
Squamous Epithelial / HPF: NONE SEEN (ref 0–5)
pH: 5 (ref 5.0–8.0)

## 2022-04-22 LAB — CBC
HCT: 47 % (ref 39.0–52.0)
Hemoglobin: 15.2 g/dL (ref 13.0–17.0)
MCH: 30.1 pg (ref 26.0–34.0)
MCHC: 32.3 g/dL (ref 30.0–36.0)
MCV: 93.1 fL (ref 80.0–100.0)
Platelets: 306 10*3/uL (ref 150–400)
RBC: 5.05 MIL/uL (ref 4.22–5.81)
RDW: 12.8 % (ref 11.5–15.5)
WBC: 16.8 10*3/uL — ABNORMAL HIGH (ref 4.0–10.5)
nRBC: 0 % (ref 0.0–0.2)

## 2022-04-22 LAB — COMPREHENSIVE METABOLIC PANEL
ALT: 38 U/L (ref 0–44)
AST: 26 U/L (ref 15–41)
Albumin: 3.9 g/dL (ref 3.5–5.0)
Alkaline Phosphatase: 69 U/L (ref 38–126)
Anion gap: 11 (ref 5–15)
BUN: 18 mg/dL (ref 8–23)
CO2: 28 mmol/L (ref 22–32)
Calcium: 9.5 mg/dL (ref 8.9–10.3)
Chloride: 98 mmol/L (ref 98–111)
Creatinine, Ser: 1.1 mg/dL (ref 0.61–1.24)
GFR, Estimated: >60 mL/min (ref >60)
Glucose, Bld: 125 mg/dL — ABNORMAL HIGH (ref 70–99)
Potassium: 4.2 mmol/L (ref 3.5–5.1)
Sodium: 137 mmol/L (ref 135–145)
Total Bilirubin: 1.2 mg/dL (ref 0.3–1.2)
Total Protein: 8 g/dL (ref 6.5–8.1)

## 2022-04-22 LAB — LIPASE, BLOOD: Lipase: 28 U/L (ref 11–51)

## 2022-04-22 MED ORDER — ACETAMINOPHEN 325 MG PO TABS
650.0000 mg | ORAL_TABLET | Freq: Once | ORAL | Status: AC
  Administered 2022-04-22: 650 mg via ORAL
  Filled 2022-04-22: qty 2

## 2022-04-22 MED ORDER — LACTATED RINGERS IV BOLUS: 1000.0000 mL | Freq: Once | INTRAVENOUS | Status: DC

## 2022-04-22 MED ORDER — LACTATED RINGERS IV BOLUS: 2000.0000 mL | Freq: Once | INTRAVENOUS | Status: DC

## 2022-04-22 MED ORDER — ONDANSETRON 4 MG PO TBDP
4.0000 mg | ORAL_TABLET | Freq: Once | ORAL | Status: AC | PRN
  Administered 2022-04-22: 4 mg via ORAL
  Filled 2022-04-22: qty 1

## 2022-04-22 MED ORDER — SODIUM CHLORIDE 0.9 % IV BOLUS
2000.0000 mL | Freq: Once | INTRAVENOUS | Status: AC
  Administered 2022-04-22: 2000 mL via INTRAVENOUS

## 2022-04-22 MED ORDER — SODIUM CHLORIDE 0.9 % IV SOLN
1.0000 g | Freq: Once | INTRAVENOUS | Status: AC
  Administered 2022-04-22: 1 g via INTRAVENOUS
  Filled 2022-04-22: qty 10

## 2022-04-22 MED ORDER — ONDANSETRON 4 MG PO TBDP: 4.0000 mg | ORAL_TABLET | Freq: Three times a day (TID) | ORAL | 0 refills | Status: DC | PRN

## 2022-04-22 MED ORDER — ONDANSETRON HCL 4 MG/2ML IJ SOLN
4.0000 mg | Freq: Once | INTRAMUSCULAR | Status: AC
  Administered 2022-04-22: 4 mg via INTRAVENOUS
  Filled 2022-04-22: qty 2

## 2022-04-22 MED ORDER — MORPHINE SULFATE (PF) 4 MG/ML IV SOLN
4.0000 mg | Freq: Once | INTRAVENOUS | Status: AC
  Administered 2022-04-22: 4 mg via INTRAVENOUS
  Filled 2022-04-22: qty 1

## 2022-04-22 MED ORDER — HYDROCODONE-ACETAMINOPHEN 5-325 MG PO TABS: 1.0000 | ORAL_TABLET | Freq: Four times a day (QID) | ORAL | 0 refills | Status: DC | PRN

## 2022-04-22 MED ORDER — ONDANSETRON 4 MG PO TBDP: 4.0000 mg | ORAL_TABLET | Freq: Two times a day (BID) | ORAL | 0 refills | Status: DC | PRN

## 2022-04-22 MED ORDER — SCOPOLAMINE 1 MG/3DAYS TD PT72
1.0000 | MEDICATED_PATCH | TRANSDERMAL | 0 refills | Status: AC
Start: 2022-04-22 — End: 2022-04-23

## 2022-04-22 MED ORDER — OXYCODONE-ACETAMINOPHEN 5-325 MG PO TABS
1.0000 | ORAL_TABLET | Freq: Once | ORAL | Status: AC
  Administered 2022-04-22: 1 via ORAL
  Filled 2022-04-22: qty 1

## 2022-04-22 MED ORDER — PROCHLORPERAZINE EDISYLATE 10 MG/2ML IJ SOLN
10.0000 mg | Freq: Once | INTRAMUSCULAR | Status: AC
  Administered 2022-04-22: 10 mg via INTRAVENOUS
  Filled 2022-04-22: qty 2

## 2022-04-22 NOTE — Assessment & Plan Note (Signed)
Treated by dermatology years ago

## 2022-04-22 NOTE — ED Triage Notes (Signed)
Pt endorses extreme nausea and lower abd pain. Pt seen here yesterday and diagnosed with diverticulitis. Called PCP office and sent here for dehydration.

## 2022-04-22 NOTE — Telephone Encounter (Signed)
Received call from triage. Patient is having symptoms of dehydration per nurse. I was able to speak to patient has had agreed to go to  ED for fluids. Does have follow up already with Dr. Damita Dunnings this week. Will keep appointment.

## 2022-04-22 NOTE — Assessment & Plan Note (Signed)
Duplex today showed normal velocities in the celiac artery and superior mesenteric artery with a maximal diameter of the celiac artery measuring about 0.9 cm which is stable to slightly decreased from the 1.1 cm we have seen previously. At this point, I think we can safely check him every other year with duplex.  He will call our office with any problems in the interim.

## 2022-04-22 NOTE — ED Notes (Signed)
Pt A&O, IV removed, pt given discharge instructions, pt able to ambulate but was wheeled out by spouse on a wheelchair.

## 2022-04-22 NOTE — Telephone Encounter (Signed)
Will await ER note.  Thanks.  

## 2022-04-22 NOTE — Progress Notes (Signed)
MRN : [id number]  Charles Collins is a 66 y.o. (10-Aug-1956) male who presents with chief complaint of  Chief Complaint  Patient presents with   Follow-up    Ultrasound follow up  .  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  He has had some diverticulitis issues, but no symptoms worrisome for chronic mesenteric ischemia.  No postprandial abdominal pain, weight loss, or food fear.  Duplex today showed normal velocities in the celiac artery and superior mesenteric artery with a maximal diameter of the celiac artery measuring about 0.9 cm which is stable to slightly decreased from the 1.1 cm we have seen previously.  Current Outpatient Medications  Medication Sig Dispense Refill   amoxicillin-clavulanate (AUGMENTIN) 875-125 MG tablet Take 1 tablet by mouth 2 (two) times daily for 10 days. 20 tablet 0   doxycycline (VIBRAMYCIN) 100 MG capsule Take 1 capsule (100 mg total) by mouth 2 (two) times daily for 7 days. 14 capsule 0   predniSONE (STERAPRED UNI-PAK 21 TAB) 10 MG (21) TBPK tablet Take by mouth daily. As directed 21 tablet 0   albuterol (VENTOLIN HFA) 108 (90 Base) MCG/ACT inhaler Inhale 1-2 puffs into the lungs every 6 (six) hours as needed for wheezing or shortness of breath. 6.7 g 0   Multiple Vitamin (MULTIVITAMIN) tablet Take 1 tablet by mouth daily.     No current facility-administered medications for this visit.    Past Medical History:  Diagnosis Date   Cancer ([place]) 11/2004   Melanoma, R Knee by Dr Nehemiah Massed   Diverticulosis    DIVERTICULOSIS ISCHEMIC COLITIS VIA COLONOSCOPY(DR. SEIGAL):(01/2001)   H. pylori infection    Hx of dysplastic nevus 06/27/2010   L dorsum mid lat. foot   Melanoma ([place])    R knee     Past Surgical History:  Procedure Laterality Date   APPENDECTOMY     As Teen   ESOPHAGOGASTRODUODENOSCOPY (EGD) WITH PROPOFOL N/A 04/10/2020   Procedure: ESOPHAGOGASTRODUODENOSCOPY (EGD) WITH PROPOFOL;   Surgeon: Lin Landsman, MD;  Location: [facility] ENDOSCOPY;  Service: Gastroenterology;  Laterality: N/A;   KNEE SURGERY     Open Repair Right as Teen   MELANOMA EXCISION Right 2006   Knee. Clark's level IV, Breslow's 0.62m     Social History   Tobacco Use   Smoking status: Former    Packs/day: 0.02    Years: 2.00    Total pack years: 0.04    Types: Cigarettes    Quit date: 11/03/1968    Years since quitting: 53.5   Smokeless tobacco: Current    Types: Chew   Tobacco comments:    quit over 30 years ago, smoked briefly as a teenager  Vaping Use   Vaping Use: Never used  Substance Use Topics   Alcohol use: No    Alcohol/week: 0.0 standard drinks of alcohol   Drug use: No      Family History  Problem Relation Age of Onset   Stroke Mother 360      Cause of Death, Hemmorhage   Diabetes Father    Hypertension Father    Obesity Father    Kidney disease Father    Kidney cancer Father    Pancreatic cancer Father        presumed, no biopsy done   Hypertension Paternal Grandfather    Heart disease Neg Hx    Cancer Neg Hx    Depression Neg Hx  Drug abuse Neg Hx    Alcohol abuse Neg Hx    Colon cancer Neg Hx    Prostate cancer Neg Hx      Allergies  Allergen Reactions   Diclofenac Other (See Comments)    Gastritis   Nsaids     H/o gastritis   Prilosec [Omeprazole] Other (See Comments)    Not an allergy, but "didn't feel well taking it"      REVIEW OF SYSTEMS (Negative unless checked)  Constitutional: '[]'$ Weight loss  '[]'$ Fever  '[]'$ Chills Cardiac: '[]'$ Chest pain   '[]'$ Chest pressure   '[]'$ Palpitations   '[]'$ Shortness of breath when laying flat   '[]'$ Shortness of breath at rest   '[]'$ Shortness of breath with exertion. Vascular:  '[]'$ Pain in legs with walking   '[]'$ Pain in legs at rest   '[]'$ Pain in legs when laying flat   '[]'$ Claudication   '[]'$ Pain in feet when walking  '[]'$ Pain in feet at rest  '[]'$ Pain in feet when laying flat   '[]'$ History of DVT   '[]'$ Phlebitis   '[]'$ Swelling in legs    '[]'$ Varicose veins   '[]'$ Non-healing ulcers Pulmonary:   '[]'$ Uses home oxygen   '[]'$ Productive cough   '[]'$ Hemoptysis   '[]'$ Wheeze  '[]'$ COPD   '[]'$ Asthma Neurologic:  '[]'$ Dizziness  '[]'$ Blackouts   '[]'$ Seizures   '[]'$ History of stroke   '[]'$ History of TIA  '[]'$ Aphasia   '[]'$ Temporary blindness   '[]'$ Dysphagia   '[]'$ Weakness or numbness in arms   '[]'$ Weakness or numbness in legs Musculoskeletal:  '[]'$ Arthritis   '[]'$ Joint swelling   '[]'$ Joint pain   '[]'$ Low back pain Hematologic:  '[]'$ Easy bruising  '[]'$ Easy bleeding   '[]'$ Hypercoagulable state   '[]'$ Anemic   Gastrointestinal:  '[]'$ Blood in stool   '[]'$ Vomiting blood  '[x]'$ Gastroesophageal reflux/heartburn   '[x]'$ Abdominal pain Genitourinary:  '[]'$ Chronic kidney disease   '[]'$ Difficult urination  '[]'$ Frequent urination  '[]'$ Burning with urination   '[]'$ Hematuria Skin:  '[]'$ Rashes   '[]'$ Ulcers   '[]'$ Wounds Psychological:  '[]'$ History of anxiety   '[]'$  History of major depression.  Physical Examination  BP 107/70 (BP Location: Left Arm)   Pulse 77   Resp 16   Wt 165 lb (74.8 kg)   BMI 25.09 kg/m  Gen:  WD/WN, NAD Head: [place]/AT, No temporalis wasting. Ear/Nose/Throat: Hearing grossly intact, nares w/o erythema or drainage Eyes: Conjunctiva clear. Sclera non-icteric Neck: Supple.  Trachea midline Pulmonary:  Good air movement, no use of accessory muscles.  Cardiac: RRR, no JVD Vascular:  Vessel Right Left  Radial Palpable Palpable   Musculoskeletal: M/S 5/5 throughout.  No deformity or atrophy. No edema. Neurologic: Sensation grossly intact in extremities.  Symmetrical.  Speech is fluent.  Psychiatric: Judgment intact, Mood & affect appropriate for pt's clinical situation. Dermatologic: No rashes or ulcers noted.  No cellulitis or open wounds.      Labs Recent Results (from the past 2160 hour(s))  Lipid panel     Status: Abnormal   Collection Time: 04/18/22  7:41 AM  Result Value Ref Range   Cholesterol 178 0 - 200 mg/dL    Comment: ATP III Classification       Desirable:  < 200 mg/dL                Borderline High:  200 - 239 mg/dL          High:  > = 240 mg/dL   Triglycerides 97.0 0.0 - 149.0 mg/dL    Comment: Normal:  <150 mg/dLBorderline High:  150 - 199 mg/dL   HDL 42.90 >39.00 mg/dL  VLDL 19.4 0.0 - 40.0 mg/dL   LDL Cholesterol 115 (H) 0 - 99 mg/dL   Total CHOL/HDL Ratio 4     Comment:                Men          Women1/2 Average Risk     3.4          3.3Average Risk          5.0          4.42X Average Risk          9.6          7.13X Average Risk          15.0          11.0                       NonHDL 134.70     Comment: NOTE:  Non-HDL goal should be 30 mg/dL higher than patient's LDL goal (i.e. LDL goal of < 70 mg/dL, would have non-HDL goal of < 100 mg/dL)  Comprehensive metabolic panel     Status: None   Collection Time: 04/18/22  7:41 AM  Result Value Ref Range   Sodium 139 135 - 145 mEq/L   Potassium 4.6 3.5 - 5.1 mEq/L   Chloride 104 96 - 112 mEq/L   CO2 27 19 - 32 mEq/L   Glucose, Bld 89 70 - 99 mg/dL   BUN 13 6 - 23 mg/dL   Creatinine, Ser 0.98 0.40 - 1.50 mg/dL   Total Bilirubin 0.5 0.2 - 1.2 mg/dL   Alkaline Phosphatase 77 39 - 117 U/L   AST 22 0 - 37 U/L   ALT 17 0 - 53 U/L   Total Protein 7.1 6.0 - 8.3 g/dL   Albumin 4.0 3.5 - 5.2 g/dL   GFR 81.01 >60.00 mL/min    Comment: Calculated using the CKD-EPI Creatinine Equation (2021)   Calcium 9.2 8.4 - 10.5 mg/dL  Comprehensive metabolic panel     Status: Abnormal   Collection Time: 04/21/22  3:43 PM  Result Value Ref Range   Sodium 137 135 - 145 mmol/L   Potassium 4.3 3.5 - 5.1 mmol/L   Chloride 104 98 - 111 mmol/L   CO2 24 22 - 32 mmol/L   Glucose, Bld 104 (H) 70 - 99 mg/dL    Comment: Glucose reference range applies only to samples taken after fasting for at least 8 hours.   BUN 14 8 - 23 mg/dL   Creatinine, Ser 0.88 0.61 - 1.24 mg/dL   Calcium 9.2 8.9 - 10.3 mg/dL   Total Protein 7.6 6.5 - 8.1 g/dL   Albumin 3.9 3.5 - 5.0 g/dL   AST 44 (H) 15 - 41 U/L   ALT 42 0 - 44 U/L   Alkaline Phosphatase 74  38 - 126 U/L   Total Bilirubin 1.0 0.3 - 1.2 mg/dL   GFR, Estimated >60 >60 mL/min    Comment: (NOTE) Calculated using the CKD-EPI Creatinine Equation (2021)    Anion gap 9 5 - 15    Comment: Performed at [facility], [address] [place], [place] [address]  Lipase, blood     Status: None   Collection Time: 04/21/22  3:43 PM  Result Value Ref Range   Lipase 23 11 - 51 U/L    Comment: Performed at [facility], [address] [place], [place]  [address]  Troponin I (High Sensitivity)     Status: None   Collection Time: 04/21/22  3:43 PM  Result Value Ref Range   Troponin I (High Sensitivity) 4 <18 ng/L    Comment: (NOTE) Elevated high sensitivity troponin I (hsTnI) values and significant  changes across serial measurements may suggest ACS but many other  chronic and acute conditions are known to elevate hsTnI results.  Refer to the "Links" section for chest pain algorithms and additional  guidance. Performed at [facility], [address] [place], [place] [address]   CBC with Differential     Status: Abnormal   Collection Time: 04/21/22  3:43 PM  Result Value Ref Range   WBC 16.3 (H) 4.0 - 10.5 K/uL   RBC 4.98 4.22 - 5.81 MIL/uL   Hemoglobin 14.7 13.0 - 17.0 g/dL   HCT 46.3 39.0 - 52.0 %   MCV 93.0 80.0 - 100.0 fL   MCH 29.5 26.0 - 34.0 pg   MCHC 31.7 30.0 - 36.0 g/dL   RDW 12.7 11.5 - 15.5 %   Platelets 311 150 - 400 K/uL   nRBC 0.0 0.0 - 0.2 %   Neutrophils Relative % 84 %   Neutro Abs 13.7 (H) 1.7 - 7.7 K/uL   Lymphocytes Relative 9 %   Lymphs Abs 1.5 0.7 - 4.0 K/uL   Monocytes Relative 6 %   Monocytes Absolute 0.9 0.1 - 1.0 K/uL   Eosinophils Relative 0 %   Eosinophils Absolute 0.0 0.0 - 0.5 K/uL   Basophils Relative 0 %   Basophils Absolute 0.0 0.0 - 0.1 K/uL   Immature Granulocytes 1 %   Abs Immature Granulocytes 0.08 (H) 0.00 - 0.07 K/uL    Comment: Performed at [facility], [address] [place], [place]  [address]  Urinalysis, Routine w reflex microscopic     Status: Abnormal   Collection Time: 04/21/22  3:43 PM  Result Value Ref Range   Color, Urine YELLOW (A) YELLOW   APPearance CLEAR (A) CLEAR   Specific Gravity, Urine 1.017 1.005 - 1.030   pH 7.0 5.0 - 8.0   Glucose, UA NEGATIVE NEGATIVE mg/dL   Hgb urine dipstick NEGATIVE NEGATIVE   Bilirubin Urine NEGATIVE NEGATIVE   Ketones, ur NEGATIVE NEGATIVE mg/dL   Protein, ur NEGATIVE NEGATIVE mg/dL   Nitrite NEGATIVE NEGATIVE   Leukocytes,Ua NEGATIVE NEGATIVE    Comment: Performed at [facility], [address], [place], [place] [address]    Radiology CT ABDOMEN PELVIS W CONTRAST  Result Date: 04/21/2022 CLINICAL DATA:  Abdominal pain.  Epigastric pain EXAM: CT ABDOMEN AND PELVIS WITH CONTRAST TECHNIQUE: Multidetector CT imaging of the abdomen and pelvis was performed using the standard protocol following bolus administration of intravenous contrast. RADIATION DOSE REDUCTION: This exam was performed according to the departmental dose-optimization program which includes automated exposure control, adjustment of the mA and/or kV according to patient size and/or use of iterative reconstruction technique. CONTRAST:  162m OMNIPAQUE IOHEXOL 300 MG/ML  SOLN COMPARISON:  None Available. FINDINGS: Lower chest: Lung bases are clear. Hepatobiliary: No focal hepatic lesion. No biliary duct dilatation. Common bile duct is normal. Pancreas: Pancreas is normal. No ductal dilatation. No pancreatic inflammation. Spleen: Normal spleen Adrenals/urinary tract: Adrenal glands are normal. Simple fluid attenuation 28 mm cyst in the cortex of the RIGHT kidney. No obstructive uropathy. Bladder normal Stomach/Bowel: Stomach, duodenum small-bowel normal. Appendix not identified. No secondary signs of appendicitis. The ascending transverse colon normal. Scattered diverticula  descending colon. Multiple diverticula sigmoid colon. Mild stranding within the sigmoid  mesocolon (for example image 78/series 2). This inflammation is along the horizontal portion of sigmoid colon. There is no frank perforation or abscess. This mild inflammatory stranding is identified on coronal image 47/5. rectum is normal. Vascular/Lymphatic: Abdominal aorta is normal caliber. No periportal or retroperitoneal adenopathy. No pelvic adenopathy. Reproductive: Prostate unremarkable. Other: No intraperitoneal free air Musculoskeletal: No aggressive osseous lesion. IMPRESSION: 1. Mild inflammatory process in the deep RIGHT pelvis is favored mild sigmoid diverticulitis. Consider follow-up imaging to demonstrate resolution. 2. No perforation.  No abscess. 3. Benign cyst of the RIGHT kidney.  No follow-up recommended. Electronically Signed   By: Suzy Bouchard M.D.   On: 04/21/2022 16:39    Assessment/Plan  MELANOMA, HX OF Treated by dermatology years ago  Diverticulosis of colon Not related to his celiac artery issues  Celiac artery aneurysm (HCC) Duplex today showed normal velocities in the celiac artery and superior mesenteric artery with a maximal diameter of the celiac artery measuring about 0.9 cm which is stable to slightly decreased from the 1.1 cm we have seen previously. At this point, I think we can safely check him every other year with duplex.  He will call our office with any problems in the interim.    Leotis Pain, MD  04/22/2022 10:44 AM    This note was created with Dragon medical transcription system.  Any errors from dictation are purely unintentional

## 2022-04-22 NOTE — Assessment & Plan Note (Signed)
Not related to his celiac artery issues

## 2022-04-22 NOTE — Telephone Encounter (Signed)
Will await ER notes.  Thanks.

## 2022-04-22 NOTE — ED Provider Notes (Signed)
[facility] Provider Note    Event Date/Time   First MD Initiated Contact with Patient 04/22/22 1754     (approximate)   History   Nausea   HPI  Charles Collins is a 66 y.o. male with a past medical history of melanoma and known celiac artery aneurysm having had a routine outpatient follow-up visit with vascular surgery this morning after being diagnosed with diverticulosis in the emergency room yesterday who presents for evaluation of some ongoing lower abdominal pain associate with fairly significant nausea and vomiting today.  Patient notes he was not prescribed any analgesia or antiemetics.  He states his pain yesterday was a 8/10 intensity and is now at 6/10 intensity.  He states he has had some chills.  Slight cough.  No new headache, earache, sore throat, diarrhea, urinary symptoms other than some dark urine.  No other acute concerns at this time.    Past Medical History:  Diagnosis Date   Cancer ([place]) 11/2004   Melanoma, R Knee by Dr Nehemiah Massed   Diverticulosis    DIVERTICULOSIS ISCHEMIC COLITIS VIA COLONOSCOPY(DR. SEIGAL):(01/2001)   H. pylori infection    Hx of dysplastic nevus 06/27/2010   L dorsum mid lat. foot   Melanoma ([place])    R knee      Physical Exam  Triage Vital Signs: ED Triage Vitals [04/22/22 1657]  Enc Vitals Group     BP 122/77     Pulse Rate 80     Resp 16     Temp 98.6 F (37 C)     Temp Source Oral     SpO2 99 %     Weight 164 lb 14.5 oz (74.8 kg)     Height '5\' 8"'$  (1.727 m)     Head Circumference      Peak Flow      Pain Score 6     Pain Loc      Pain Edu?      Excl. in [place]?     Most recent vital signs: Vitals:   04/22/22 1657  BP: 122/77  Pulse: 80  Resp: 16  Temp: 98.6 F (37 C)  SpO2: 99%    General: Awake, no distress.  CV:  2+ radial pulses.  No significant murmur Resp:  Normal effort.  Abd:  No distention.  Tenderness in the bilateral lower quadrants.  No rigidity or frank peritonitis.  No  CVA tenderness. Other:  Dry mucous membranes.   ED Results / Procedures / Treatments  Labs (all labs ordered are listed, but only abnormal results are displayed) Labs Reviewed  COMPREHENSIVE METABOLIC PANEL - Abnormal; Notable for the following components:      Result Value   Glucose, Bld 125 (*)    All other components within normal limits  CBC - Abnormal; Notable for the following components:   WBC 16.8 (*)    All other components within normal limits  URINALYSIS, ROUTINE W REFLEX MICROSCOPIC - Abnormal; Notable for the following components:   Color, Urine YELLOW (*)    APPearance HAZY (*)    Ketones, ur 20 (*)    Protein, ur 30 (*)    All other components within normal limits  LIPASE, BLOOD     EKG  ECG is remarkable sinus tachycardia with ventricular rate of 100 with normal axis and slightly prolonged QTc interval of 496 without any other clear evidence of acute ischemia or significant arrhythmia.   RADIOLOGY  CT abdomen pelvis obtained  yesterday during patient's yesterday ED evaluation on my interpretation does show some inflammatory changes around the right sigmoid colon.  This appears to be diverticulitis.  I do not see an abscess or perforation.  I also reviewed radiology's interpretation and agree with the findings of likely right.  Sigmoid diverticulitis without evidence of perforation or abscess and a benign cyst in the right kidney.   PROCEDURES:  Critical Care performed: No  .1-3 Lead EKG Interpretation  Performed by: Lucrezia Starch, MD Authorized by: Lucrezia Starch, MD     Interpretation: normal     ECG rate assessment: normal     Rhythm: sinus rhythm     Ectopy: none     Conduction: normal     The patient is on the cardiac monitor to evaluate for evidence of arrhythmia and/or significant heart rate changes.   MEDICATIONS ORDERED IN ED: Medications  sodium chloride 0.9 % bolus 2,000 mL (has no administration in time range)  prochlorperazine  (COMPAZINE) injection 10 mg (has no administration in time range)  ondansetron (ZOFRAN-ODT) disintegrating tablet 4 mg (4 mg Oral Given 04/22/22 1700)  cefTRIAXone (ROCEPHIN) 1 g in sodium chloride 0.9 % 100 mL IVPB (1 g Intravenous New Bag/Given 04/22/22 1905)  ondansetron (ZOFRAN) injection 4 mg (4 mg Intravenous Given 04/22/22 1904)  morphine (PF) 4 MG/ML injection 4 mg (4 mg Intravenous Given 04/22/22 1904)     IMPRESSION / MDM / ASSESSMENT AND PLAN / ED COURSE  I reviewed the triage vital signs and the nursing notes. Patient's presentation is most consistent with acute presentation with potential threat to life or bodily function.                               Differential diagnosis includes, but is not limited to ongoing symptoms related to recently diagnosed diverticulitis yesterday with a lower suspicion for interim development of perforation after review of CT given patient reports improvement in his pain and no frank peritonitis on exam fever or abnormal vital signs.  I also think it is unlikely he has developed an abscess in the short time interval.  Given reports of nausea and vomiting throughout the day he is at risk for kidney injury and electrolyte derangements.  CMP shows no significant electrolyte or metabolic derangements.  No evidence of new hepatitis or cholestatic process.  Lipase WNL and not suggestive of pancreatitis.  CBC shows stable leukocytosis with a WBC count of 16.8 compared to 16.32 days ago.  No acute anemia and normal platelets today.  UA has some protein ketones but no evidence of infection.  We will give antiemetics hydration and IV analgesia and reassess.  Patient did require some Compazine for some persistent nausea after Zofran only is feeling much better after this.  He is able tolerate ginger ale without significant difficulty.  He reports his pain is improved and I have a low suspicion for immediate life-threatening process.  He was given a dose of Rocephin and  will cover him for his diverticulitis until tomorrow morning when he can resume his Augmentin.  I will write for some Norco.  Given his QTc interval is borderline prolonged advised him to only take Zofran every 12 hours for next 48 hours and I will also prescribe a scopolamine patch to help with the nausea.  Discussed returning for any new or worsening of symptoms.  Discharged in stable condition.  Strict return precautions advised and discussed.  FINAL CLINICAL IMPRESSION(S) / ED DIAGNOSES   Final diagnoses:  Diverticulitis  Nausea and vomiting, unspecified vomiting type     Rx / DC Orders   ED Discharge Orders          Ordered    ondansetron (ZOFRAN-ODT) 4 MG disintegrating tablet  Every 8 hours PRN        04/22/22 1824    HYDROcodone-acetaminophen (NORCO) 5-325 MG tablet  Every 6 hours PRN        04/22/22 1824             Note:  This document was prepared using Dragon voice recognition software and may include unintentional dictation errors.   Lucrezia Starch, MD 04/22/22 2103

## 2022-04-22 NOTE — ED Provider Triage Note (Signed)
Emergency Medicine Provider Triage Evaluation Note  Charles Collins , a 66 y.o. male  was evaluated in triage.  Pt complains of severe nausea.  Patient diagnosed with diverticulitis yesterday.  Discharged with amoxicillin.  Son been able to take 2 doses due to the vomiting..  Review of Systems  Positive: Abdominal pain, vomiting, diverticulitis Negative: Fever  Physical Exam  BP 122/77   Pulse 80   Temp 98.6 F (37 C) (Oral)   Resp 16   Ht '5\' 8"'$  (1.727 m)   Wt 74.8 kg   SpO2 99%   BMI 25.07 kg/m  Gen:   Awake, no distress   Resp:  Normal effort  MSK:   Moves extremities without difficulty  Other:    Medical Decision Making  Medically screening exam initiated at 5:04 PM.  Appropriate orders placed.  Charles Collins was informed that the remainder of the evaluation will be completed by another provider, this initial triage assessment does not replace that evaluation, and the importance of remaining in the ED until their evaluation is complete.  Patient was given Zofran ODT here in the ED.  Labs initiated.  Patient will need IV and fluids   Versie Starks, PA-C 04/22/22 1705

## 2022-04-22 NOTE — Telephone Encounter (Signed)
Patient triaged and sent to ED.

## 2022-04-22 NOTE — Telephone Encounter (Signed)
See ER note.

## 2022-04-23 ENCOUNTER — Encounter: Payer: Self-pay | Admitting: Family Medicine

## 2022-04-23 ENCOUNTER — Telehealth: Payer: Self-pay | Admitting: Family Medicine

## 2022-04-23 NOTE — Telephone Encounter (Signed)
Please get update on patient and set ER f/u.  Thanks.

## 2022-04-23 NOTE — Telephone Encounter (Signed)
Spoke with pt for update. States he's feeling better, just has some nausea. Pt has CPE tomorrow at 9:00.

## 2022-04-23 NOTE — Telephone Encounter (Signed)
Patient called back wanted to make sure Dr. Damita Dunnings was aware that he is having vomiting. Advised I would add to message for his review.

## 2022-04-23 NOTE — Telephone Encounter (Signed)
I am not in clinic today.  I saw his ER notes.  If he is vomiting without any relief from his nausea medications and cannot keep his medicine down/is getting dehydrated, then the only option know at this point will be to go to the emergency room.  I would try to take sips of fluids in the meantime.  I hope he feels better soon and I will plan on seeing him tomorrow.

## 2022-04-23 NOTE — Telephone Encounter (Signed)
Spoke to pt. Read him Dr Josefine Class note. He said he was doing the sips and will be here for his OV tomorrow.

## 2022-04-24 ENCOUNTER — Encounter: Payer: Self-pay | Admitting: Family Medicine

## 2022-04-24 ENCOUNTER — Telehealth: Payer: Self-pay

## 2022-04-24 ENCOUNTER — Ambulatory Visit (INDEPENDENT_AMBULATORY_CARE_PROVIDER_SITE_OTHER): Payer: Medicare Other | Admitting: Family Medicine

## 2022-04-24 VITALS — BP 120/84 | HR 77 | Temp 98.0°F | Ht 66.5 in | Wt 163.1 lb

## 2022-04-24 DIAGNOSIS — I728 Aneurysm of other specified arteries: Secondary | ICD-10-CM

## 2022-04-24 DIAGNOSIS — Z7189 Other specified counseling: Secondary | ICD-10-CM

## 2022-04-24 DIAGNOSIS — Z1211 Encounter for screening for malignant neoplasm of colon: Secondary | ICD-10-CM

## 2022-04-24 DIAGNOSIS — Z0000 Encounter for general adult medical examination without abnormal findings: Secondary | ICD-10-CM | POA: Diagnosis not present

## 2022-04-24 DIAGNOSIS — K5792 Diverticulitis of intestine, part unspecified, without perforation or abscess without bleeding: Secondary | ICD-10-CM

## 2022-04-24 MED ORDER — PROMETHAZINE HCL 25 MG/ML IJ SOLN
25.0000 mg | Freq: Once | INTRAMUSCULAR | Status: AC
  Administered 2022-04-24: 25 mg via INTRAMUSCULAR

## 2022-04-24 MED ORDER — CEFTRIAXONE SODIUM 1 G IJ SOLR
1.0000 g | Freq: Once | INTRAMUSCULAR | Status: AC
  Administered 2022-04-24: 1 g via INTRAMUSCULAR

## 2022-04-24 MED ORDER — PROCHLORPERAZINE MALEATE 5 MG PO TABS: 5.0000 mg | ORAL_TABLET | Freq: Two times a day (BID) | ORAL | 0 refills | Status: DC | PRN

## 2022-04-24 NOTE — Patient Instructions (Addendum)
Clear liquids for now.  Update [place] tomorrow AM.   Compazine twice a day if needed.  Continue augmentin.   Take care.  Glad to see you. Use debrox prior to showering then rinse your ear.

## 2022-04-24 NOTE — Telephone Encounter (Signed)
Gastroenterology Pre-Procedure Review  Request Date: TBD Requesting Physician: Dr. Marius Ditch  PATIENT REVIEW QUESTIONS: The patient responded to the following health history questions as indicated:    1. Are you having any GI issues?  Patient states the reason for his colonoscopy is because he is still having problems recovering from diverticulitis ER Visit 04/22/22.  Referral stated "Screening Colonoscopy"  [place] office has been asked to schedule an office visit for follow up Diverticulitis. 2. Do you have a personal history of Polyps? no 3. Do you have a family history of Colon Cancer or Polyps? no 4. Diabetes Mellitus? no 5. Joint replacements in the past 12 months?no 6. Major health problems in the past 3 months?no 7. Any artificial heart valves, MVP, or defibrillator?no    MEDICATIONS & ALLERGIES:    Patient reports the following regarding taking any anticoagulation/antiplatelet therapy:   Plavix, Coumadin, Eliquis, Xarelto, Lovenox, Pradaxa, Brilinta, or Effient? no Aspirin? no  Patient confirms/reports the following medications:  Current Outpatient Medications  Medication Sig Dispense Refill   amoxicillin-clavulanate (AUGMENTIN) 875-125 MG tablet Take 1 tablet by mouth 2 (two) times daily for 10 days. 20 tablet 0   Ascorbic Acid (VITAMIN C PO) Take by mouth daily.     Chlorpheniramine Maleate (ALLERGY PO) Take by mouth.     MISC NATURAL PRODUCTS EX Apply topically.     Multiple Vitamins-Minerals (ZINC PO) Take by mouth daily.     prochlorperazine (COMPAZINE) 5 MG tablet Take 1 tablet (5 mg total) by mouth 2 (two) times daily as needed for nausea or vomiting. 30 tablet 0   No current facility-administered medications for this visit.    Patient confirms/reports the following allergies:  Allergies  Allergen Reactions   Diclofenac Other (See Comments)    Gastritis   Nsaids     H/o gastritis   Prilosec [Omeprazole] Other (See Comments)    Not an allergy, but "didn't feel well  taking it"     No orders of the defined types were placed in this encounter.   AUTHORIZATION INFORMATION Primary Insurance: 1D#: Group #:  Secondary Insurance: 1D#: Group #:  SCHEDULE INFORMATION: Date: Office Visit will be scheduled with Dr. Marius Ditch. Time: Location: TBD

## 2022-04-24 NOTE — Progress Notes (Unsigned)
I have personally reviewed the Medicare Annual Wellness questionnaire and have noted 1. The patient's medical and social history 2. Their use of alcohol, tobacco or illicit drugs 3. Their current medications and supplements 4. The patient's functional ability including ADL's, fall risks, home safety risks and hearing or visual             impairment. 5. Diet and physical activities 6. Evidence for depression or mood disorders  The patients weight, height, BMI have been recorded in the chart and visual acuity is per eye clinic.  I have made referrals, counseling and provided education to the patient based review of the above and I have provided the pt with a written personalized care plan for preventive services.  Provider list updated- see scanned forms.  Routine anticipatory guidance given to patient.  See health maintenance. The possibility exists that previously documented standard health maintenance information may have been brought forward from a previous encounter into this note.  If needed, that same information has been updated to reflect the current situation based on today's encounter.    Flu Shingles PNA Tetanus Colon  Breast cancer screening Prostate cancer screening and PSA options (with potential risks and benefits of testing vs not testing) were discussed along with recent recs/guidelines.  He declined testing PSA at this point. Advance directive Cognitive function addressed- see scanned forms- and if abnormal then additional documentation follows.   In addition to [facility], follow up visit for the below conditions:  Recent eval for diverticulitis.  Looser BM this AM.  Still with dec in appetite, nausea.  Compazine helped with nausea.    PMH and SH reviewed  Meds, vitals, and allergies reviewed.   ROS: Per HPI.  Unless specifically indicated otherwise in HPI, the patient denies:  General: fever. Eyes: acute vision changes ENT: sore throat Cardiovascular:  chest pain Respiratory: SOB GI: vomiting GU: dysuria Musculoskeletal: acute back pain Derm: acute rash Neuro: acute motor dysfunction Psych: worsening mood Endocrine: polydipsia Heme: bleeding Allergy: hayfever  GEN: nad, alert and oriented HEENT: mucous membranes moist NECK: supple w/o LA CV: rrr. PULM: ctab, no inc wob ABD: soft, +bs EXT: no edema SKIN: no acute rash

## 2022-04-25 ENCOUNTER — Telehealth: Payer: Self-pay

## 2022-04-25 ENCOUNTER — Telehealth: Payer: Self-pay | Admitting: Family Medicine

## 2022-04-25 ENCOUNTER — Ambulatory Visit (INDEPENDENT_AMBULATORY_CARE_PROVIDER_SITE_OTHER): Payer: Medicare Other

## 2022-04-25 DIAGNOSIS — K5792 Diverticulitis of intestine, part unspecified, without perforation or abscess without bleeding: Secondary | ICD-10-CM

## 2022-04-25 MED ORDER — CEFTRIAXONE SODIUM 1 G IJ SOLR
1.0000 g | Freq: Once | INTRAMUSCULAR | Status: AC
  Administered 2022-04-25: 1 g via INTRAMUSCULAR

## 2022-04-25 NOTE — Telephone Encounter (Signed)
Please get update on patient re: nausea, abd sx.  Thanks.

## 2022-04-25 NOTE — Telephone Encounter (Signed)
Patient states that he was seen yesterday, and was advised to call back if he needed another shot of antibiotic. Jorja Loa says he would like to proceed with this.

## 2022-04-25 NOTE — Telephone Encounter (Signed)
Duplicate- see other message

## 2022-04-25 NOTE — Progress Notes (Signed)
Per orders of Dr. Crawford Givens, injection of rocephin  given by Donnamarie Poag in right upper outer quadrant.  Patient tolerated injection well.

## 2022-04-27 ENCOUNTER — Telehealth: Payer: Self-pay | Admitting: Family Medicine

## 2022-04-27 ENCOUNTER — Encounter: Payer: Self-pay | Admitting: Family Medicine

## 2022-04-27 DIAGNOSIS — K5792 Diverticulitis of intestine, part unspecified, without perforation or abscess without bleeding: Secondary | ICD-10-CM

## 2022-04-27 DIAGNOSIS — Z0000 Encounter for general adult medical examination without abnormal findings: Secondary | ICD-10-CM | POA: Insufficient documentation

## 2022-04-27 HISTORY — DX: Diverticulitis of intestine, part unspecified, without perforation or abscess without bleeding: K57.92

## 2022-04-27 NOTE — Telephone Encounter (Signed)
Please get update on patient on Monday.  I need to know about abdominal pain, tolerating liquid and solid food, tolerating Augmentin.  Thanks.

## 2022-04-27 NOTE — Telephone Encounter (Signed)
 See follow-up phone note.

## 2022-04-28 ENCOUNTER — Telehealth: Payer: Self-pay | Admitting: Family Medicine

## 2022-04-28 NOTE — Telephone Encounter (Signed)
Patient states abd pain is minimal right now (0-1). Patient has been able to tolerate the augmentin better now by taking it with food.

## 2022-05-13 ENCOUNTER — Other Ambulatory Visit: Payer: Self-pay

## 2022-05-13 ENCOUNTER — Ambulatory Visit (INDEPENDENT_AMBULATORY_CARE_PROVIDER_SITE_OTHER): Payer: Medicare Other | Admitting: Gastroenterology

## 2022-05-13 ENCOUNTER — Encounter: Payer: Self-pay | Admitting: Gastroenterology

## 2022-05-13 VITALS — BP 119/78 | HR 74 | Temp 98.0°F | Ht 66.5 in | Wt 163.2 lb

## 2022-05-13 DIAGNOSIS — Z8719 Personal history of other diseases of the digestive system: Secondary | ICD-10-CM

## 2022-05-13 DIAGNOSIS — K5732 Diverticulitis of large intestine without perforation or abscess without bleeding: Secondary | ICD-10-CM | POA: Diagnosis not present

## 2022-05-13 MED ORDER — NA SULFATE-K SULFATE-MG SULF 17.5-3.13-1.6 GM/177ML PO SOLN: 354.0000 mL | Freq: Once | ORAL | 0 refills | Status: AC

## 2022-05-13 NOTE — Progress Notes (Signed)
Cephas Darby, MD [address]  [place]  [place], [place] [address]  Main: [phone]  Fax: [phone]    Gastroenterology Consultation  Referring Provider:     Tonia Ghent, MD Primary Care Physician:  Tonia Ghent, MD Primary Gastroenterologist:  Dr. Cephas Darby Reason for Consultation: Follow-up of acute diverticulitis        HPI:   Charles Collins is a 66 y.o. male referred by Dr. Damita Dunnings, Elveria Rising, MD  for consultation & management of recent episode of acute diverticulitis.  Patient went to the ER on 04/21/2022 secondary to lower abdominal pain, predominantly in the right lower quadrant.  He was found to have significant leukocytosis WBC count 16.9, CT abdomen and pelvis with contrast processing) mild sigmoid diverticulitis.  No evidence of recommendation on biopsies.  Patient received amoxicillin which she did not tolerate.  He was switched to Augmentin and tolerated it well.  Finished 5 days course of antibiotic.  The symptoms have currently resolved.  He is trying to eat healthy food, avoiding red meat.  His bowel movements are back to normal, denies any rectal bleeding, fever, chills, nausea or vomiting.  He is here to discuss about colonoscopy.  Patient is very active, stays healthy, works in [employer]  NSAIDs: None  Antiplts/Anticoagulants/Anti thrombotics: None  GI Procedures: Colonoscopy in 2002  Past Medical History:  Diagnosis Date   Cancer ([place]) 11/2004   Melanoma, R Knee by Dr Nehemiah Massed   Diverticulosis    DIVERTICULOSIS ISCHEMIC COLITIS VIA COLONOSCOPY(DR. SEIGAL):(01/2001)   H. pylori infection    Hx of dysplastic nevus 06/27/2010   L dorsum mid lat. foot   Melanoma ([place])    R knee     Past Surgical History:  Procedure Laterality Date   APPENDECTOMY     As Teen   ESOPHAGOGASTRODUODENOSCOPY (EGD) WITH PROPOFOL N/A 04/10/2020   Procedure: ESOPHAGOGASTRODUODENOSCOPY (EGD) WITH PROPOFOL;  Surgeon: Lin Landsman, MD;  Location:  [facility] ENDOSCOPY;  Service: Gastroenterology;  Laterality: N/A;   KNEE SURGERY     Open Repair Right as Teen   MELANOMA EXCISION Right 2006   Knee. Clark's level IV, Breslow's 0.17m     Current Outpatient Medications:    Ascorbic Acid (VITAMIN C PO), Take by mouth daily., Disp: , Rfl:    Chlorpheniramine Maleate (ALLERGY PO), Take by mouth., Disp: , Rfl:    MISC NATURAL PRODUCTS EX, Apply topically., Disp: , Rfl:    Multiple Vitamins-Minerals (ZINC PO), Take by mouth daily., Disp: , Rfl:    Family History  Problem Relation Age of Onset   Stroke Mother 376      Cause of Death, [place]   Diabetes Father    Hypertension Father    Obesity Father    Kidney disease Father    Kidney cancer Father    Pancreatic cancer Father        presumed, no biopsy done   Hypertension Paternal Grandfather    Heart disease Neg Hx    Cancer Neg Hx    Depression Neg Hx    Drug abuse Neg Hx    Alcohol abuse Neg Hx    Colon cancer Neg Hx    Prostate cancer Neg Hx      Social History   Tobacco Use   Smoking status: Former    Packs/day: 0.02    Years: 2.00    Total pack years: 0.04    Types: Cigarettes    Quit date:  11/03/1968    Years since quitting: 53.5   Smokeless tobacco: Current    Types: Chew   Tobacco comments:    quit over 30 years ago, smoked briefly as a teenager  Vaping Use   Vaping Use: Never used  Substance Use Topics   Alcohol use: No    Alcohol/week: 0.0 standard drinks of alcohol   Drug use: No    Allergies as of 05/13/2022 - Review Complete 05/13/2022  Allergen Reaction Noted   Diclofenac Other (See Comments) 05/08/2020   Nsaids  04/20/2020   Prilosec [omeprazole] Other (See Comments) 01/05/2013    Review of Systems:    All systems reviewed and negative except where noted in HPI.   Physical Exam:  BP 119/78 (BP Location: Left Arm, Patient Position: Sitting, Cuff Size: Normal)   Pulse 74   Temp 98 F (36.7 C) (Oral)   Ht 5' 6.5" (1.689 m)   Wt 163 lb 4 oz  (74 kg)   BMI 25.95 kg/m  No LMP for male patient.  General:   Alert,  Well-developed, well-nourished, pleasant and cooperative in NAD Head:  Normocephalic and atraumatic. Eyes:  Sclera clear, no icterus.   Conjunctiva pink. Ears:  Normal auditory acuity. Nose:  No deformity, discharge, or lesions. Mouth:  No deformity or lesions,oropharynx pink & moist. Neck:  Supple; no masses or thyromegaly. Lungs:  Respirations even and unlabored.  Clear throughout to auscultation.   No wheezes, crackles, or rhonchi. No acute distress. Heart:  Regular rate and rhythm; no murmurs, clicks, rubs, or gallops. Abdomen:  Normal bowel sounds. Soft, non-tender and non-distended without masses, hepatosplenomegaly or hernias noted.  No guarding or rebound tenderness.   Rectal: Not performed Msk:  Symmetrical without gross deformities. Good, equal movement & strength bilaterally. Pulses:  Normal pulses noted. Extremities:  No clubbing or edema.  No cyanosis. Neurologic:  Alert and oriented x3;  grossly normal neurologically. Skin:  Intact without significant lesions or rashes. No jaundice. Psych:  Alert and cooperative. Normal mood and affect.  Imaging Studies: Reviewed  Assessment and Plan:   Charles Collins is a 66 y.o. pleasant Caucasian male with no significant past medical history, history of sigmoid diverticulosis is seen in consultation for recent episode of acute sigmoid uncomplicated diverticulitis.  Patient is currently asymptomatic, s/p 5 days course of Augmentin  Proceed with diagnostic colonoscopy  I have discussed alternative options, risks & benefits,  which include, but are not limited to, bleeding, infection, perforation,respiratory complication & drug reaction.  The patient agrees with this plan & written consent will be obtained.    Follow up as needed   Cephas Darby, MD

## 2022-05-22 ENCOUNTER — Encounter: Payer: Self-pay | Admitting: Gastroenterology

## 2022-05-23 ENCOUNTER — Ambulatory Visit
Admission: RE | Admit: 2022-05-23 | Discharge: 2022-05-23 | Disposition: A | Discharge status: Home / Self Care | Payer: Medicare Other | Source: Ambulatory Visit | Attending: Family Medicine | Admitting: Family Medicine

## 2022-05-23 VITALS — BP 115/73 | HR 68 | Temp 98.7°F | Resp 16

## 2022-05-23 DIAGNOSIS — H66001 Acute suppurative otitis media without spontaneous rupture of ear drum, right ear: Secondary | ICD-10-CM

## 2022-05-23 DIAGNOSIS — H6121 Impacted cerumen, right ear: Secondary | ICD-10-CM | POA: Diagnosis not present

## 2022-05-23 MED ORDER — AMOXICILLIN 875 MG PO TABS
875.0000 mg | ORAL_TABLET | Freq: Two times a day (BID) | ORAL | 0 refills | Status: DC
Start: 2022-05-23 — End: 2022-05-29

## 2022-05-23 NOTE — ED Triage Notes (Signed)
Patient c/o RT ear fullness  x 2 weeks.   Patient denies any other cold symptoms.   Patient endorses muffled ear sounds.   Patient enodores ringing in ears.   Patient has used Debrox ear drops with no relief. Patient has used Hydrogen Peroxide with worsening of symptoms.

## 2022-05-23 NOTE — ED Provider Notes (Signed)
[name]    CSN: [id number] Arrival date & time: 05/23/22  1518      History   Chief Complaint Chief Complaint  Patient presents with   Ear Fullness   APPT 1530    HPI Charles Collins is a 66 y.o. male.   HPI Patient presents today for evaluation of ear fullness x2 weeks.  Patient denies any URI symptoms however is having hearing loss in the right ear and endorses tinnitus symptoms.  He has been using peroxide along with Debrox drops and has not had any relief of symptoms.  Denies any symptoms involving the left ear.  Past Medical History:  Diagnosis Date   Cancer ([place]) 11/2004   Melanoma, R Knee by Dr Nehemiah Massed   Diverticulitis 04/27/2022   Diverticulosis    DIVERTICULOSIS ISCHEMIC COLITIS VIA COLONOSCOPY(DR. SEIGAL):(01/2001)   H. pylori infection    Hx of dysplastic nevus 06/27/2010   L dorsum mid lat. 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 Clark's level IV, Breslow's 0.72m       Home Medications    Prior to Admission medications   Medication Sig Start Date End Date Taking? Authorizing Provider  amoxicillin (AMOXIL) 875 MG tablet Take 1 tablet (875 mg total) by mouth 2 (two) times daily for 7 days. 05/23/22 05/30/22 Yes HScot Jun FNP  Ascorbic Acid (VITAMIN C PO) Take by  mouth daily.   Yes [provider]  Chlorpheniramine Maleate (ALLERGY PO) Take by mouth.   Yes [provider]  MISC NATURAL PRODUCTS EX Apply topically.   Yes [provider]  Multiple Vitamins-Minerals (ZINC PO) Take by mouth daily.   Yes [provider]    Family History Family History  Problem Relation Age of Onset   Stroke Mother 322      Cause of Death, [place]   Diabetes Father    Hypertension Father    Obesity Father    Kidney disease Father    Kidney cancer Father    Pancreatic cancer Father        presumed, no biopsy done   Hypertension Paternal Grandfather    Heart disease Neg Hx    Cancer Neg Hx    Depression Neg Hx    Drug abuse Neg Hx    Alcohol abuse Neg Hx    Colon cancer Neg Hx    Prostate cancer Neg Hx     Social History Social History   Tobacco Use   Smoking status: Former    Packs/day: 0.02    Years: 2.00    Total pack years: 0.04    Types: Cigarettes    Quit date: 11/03/1968    Years since quitting: 53.5   Smokeless tobacco: Current    Types: CLoss adjuster, chartered  Tobacco comments:    quit over 30 years ago, smoked briefly as a teenager  Vaping Use   Vaping Use: Never used  Substance Use Topics   Alcohol use: No    Alcohol/week: 0.0 standard drinks of alcohol   Drug use: No     Allergies   Diclofenac, Nsaids, and Prilosec [omeprazole]   Review of Systems Review of Systems Pertinent negatives listed in HPI  Physical Exam Triage Vital Signs ED Triage Vitals  Enc Vitals Group     BP 05/23/22 1527 115/73     Pulse Rate 05/23/22 1527 68     Resp 05/23/22 1527 16     Temp 05/23/22 1527 98.7 F (37.1 C)     Temp Source 05/23/22 1527 Oral     SpO2 05/23/22 1527 97 %     Weight --      Height --      Head Circumference --      Peak Flow --      Pain Score 05/23/22 1529 0     Pain Loc --      Pain Edu? --      Excl. in [place]? --    No data found.  Updated Vital Signs BP 115/73 (BP Location: Left Arm)    Pulse 68   Temp 98.7 F (37.1 C) (Oral)   Resp 16   SpO2 97%   Visual Acuity Right Eye Distance:   Left Eye Distance:   Bilateral Distance:    Right Eye Near:   Left Eye Near:    Bilateral Near:     Physical Exam Constitutional:      Appearance: Normal appearance.  HENT:     Head: Normocephalic and atraumatic.     Right Ear: Decreased hearing noted. There is impacted cerumen.     Left Ear: Tympanic membrane, ear canal and external ear normal.  Cardiovascular:     Rate and Rhythm: Normal rate and regular rhythm.     Heart sounds: Normal heart sounds.  Pulmonary:     Effort: Pulmonary effort is normal.     Breath sounds: Normal breath sounds.  Musculoskeletal:     Cervical back: Full passive range of motion without pain, normal range of motion and neck supple.  Neurological:     Mental Status: He is alert.     GCS: GCS eye subscore is 4. GCS verbal subscore is 5. GCS motor subscore is 6.  Psychiatric:        Attention and Perception: Attention and perception normal.        Mood and Affect: Mood and affect normal.        Speech: Speech normal.      UC Treatments / Results  Labs (all labs ordered are listed, but only abnormal results are displayed) Labs Reviewed - No data to display  EKG   Radiology No results found.  Procedures Procedures (including critical care time)  Medications Ordered in UC Medications - No data to display  Initial Impression / Assessment and Plan / UC Course  I have reviewed the triage vital signs and the nursing notes.  Pertinent labs & imaging results that were available during my care of the patient were reviewed by me and considered in my medical decision making (see chart for details).    Hearing loss of right ear cerumen impaction  RN irrigated right ear and cerumen removed. Amoxicillin BID x 7 days for ear infection. Hearing does not readily follow-up with primary care  provider to schedule audiology evaluation. Final  Clinical Impressions(s) / UC Diagnoses   Final diagnoses:  Hearing loss of right ear due to cerumen impaction  Non-recurrent acute suppurative otitis media of right ear without spontaneous rupture of tympanic membrane   Discharge Instructions   None    ED Prescriptions     Medication Sig Dispense Auth. Provider   amoxicillin (AMOXIL) 875 MG tablet Take 1 tablet (875 mg total) by mouth 2 (two) times daily for 7 days. 14 tablet Scot Jun, FNP      PDMP not reviewed this encounter.   Scot Jun, FNP 05/23/22 [phone]

## 2022-05-29 ENCOUNTER — Encounter: Payer: Self-pay | Admitting: Gastroenterology

## 2022-05-29 ENCOUNTER — Ambulatory Visit: Payer: Medicare Other | Admitting: Anesthesiology

## 2022-05-29 ENCOUNTER — Ambulatory Visit
Admission: RE | Admit: 2022-05-29 | Discharge: 2022-05-29 | Disposition: A | Discharge status: Home / Self Care | Payer: Medicare Other | Attending: Gastroenterology | Admitting: Gastroenterology

## 2022-05-29 ENCOUNTER — Encounter
Admission: RE | Disposition: A | Discharge status: Home / Self Care | Payer: Self-pay | Source: Home / Self Care | Attending: Gastroenterology

## 2022-05-29 ENCOUNTER — Other Ambulatory Visit: Payer: Self-pay

## 2022-05-29 DIAGNOSIS — D123 Benign neoplasm of transverse colon: Secondary | ICD-10-CM | POA: Diagnosis not present

## 2022-05-29 DIAGNOSIS — K5730 Diverticulosis of large intestine without perforation or abscess without bleeding: Secondary | ICD-10-CM | POA: Diagnosis not present

## 2022-05-29 DIAGNOSIS — Z87891 Personal history of nicotine dependence: Secondary | ICD-10-CM | POA: Diagnosis not present

## 2022-05-29 DIAGNOSIS — Z09 Encounter for follow-up examination after completed treatment for conditions other than malignant neoplasm: Secondary | ICD-10-CM | POA: Insufficient documentation

## 2022-05-29 DIAGNOSIS — Z8719 Personal history of other diseases of the digestive system: Secondary | ICD-10-CM

## 2022-05-29 DIAGNOSIS — K635 Polyp of colon: Secondary | ICD-10-CM

## 2022-05-29 DIAGNOSIS — K644 Residual hemorrhoidal skin tags: Secondary | ICD-10-CM | POA: Diagnosis not present

## 2022-05-29 DIAGNOSIS — K5732 Diverticulitis of large intestine without perforation or abscess without bleeding: Secondary | ICD-10-CM | POA: Insufficient documentation

## 2022-05-29 HISTORY — PX: POLYPECTOMY: SHX5525

## 2022-05-29 HISTORY — PX: COLONOSCOPY WITH PROPOFOL: SHX5780

## 2022-05-29 SURGERY — COLONOSCOPY WITH PROPOFOL
Anesthesia: General | Site: Rectum

## 2022-05-29 MED ORDER — GLYCOPYRROLATE 0.2 MG/ML IJ SOLN
INTRAMUSCULAR | Status: DC | PRN
  Administered 2022-05-29: .2 mg via INTRAVENOUS

## 2022-05-29 MED ORDER — LIDOCAINE HCL (CARDIAC) PF 100 MG/5ML IV SOSY
PREFILLED_SYRINGE | INTRAVENOUS | Status: DC | PRN
  Administered 2022-05-29: 40 mg via INTRAVENOUS

## 2022-05-29 MED ORDER — ACETAMINOPHEN 325 MG PO TABS: 325.0000 mg | ORAL_TABLET | ORAL | Status: DC | PRN

## 2022-05-29 MED ORDER — ONDANSETRON HCL 4 MG/2ML IJ SOLN: 4.0000 mg | Freq: Once | INTRAMUSCULAR | Status: DC | PRN

## 2022-05-29 MED ORDER — LACTATED RINGERS IV SOLN: INTRAVENOUS | Status: DC

## 2022-05-29 MED ORDER — PROPOFOL 10 MG/ML IV BOLUS
INTRAVENOUS | Status: DC | PRN
  Administered 2022-05-29 (×4): 50 mg via INTRAVENOUS
  Administered 2022-05-29: 70 mg via INTRAVENOUS

## 2022-05-29 MED ORDER — ACETAMINOPHEN 160 MG/5ML PO SOLN: 325.0000 mg | ORAL | Status: DC | PRN

## 2022-05-29 MED ORDER — SODIUM CHLORIDE 0.9 % IV SOLN: INTRAVENOUS | Status: DC

## 2022-05-29 MED ORDER — STERILE WATER FOR IRRIGATION IR SOLN
Status: DC | PRN
  Administered 2022-05-29: 100

## 2022-05-29 SURGICAL SUPPLY — 8 items
GOWN CVR UNV OPN BCK APRN NK (MISCELLANEOUS) ×2 IMPLANT
GOWN ISOL THUMB LOOP REG UNIV (MISCELLANEOUS) ×4
KIT PRC NS LF DISP ENDO (KITS) ×1 IMPLANT
KIT PROCEDURE OLYMPUS (KITS) ×2
MANIFOLD NEPTUNE II (INSTRUMENTS) ×2 IMPLANT
SNARE COLD EXACTO (MISCELLANEOUS) ×1 IMPLANT
TRAP ETRAP POLY (MISCELLANEOUS) ×1 IMPLANT
WATER STERILE IRR 250ML POUR (IV SOLUTION) ×2 IMPLANT

## 2022-05-29 NOTE — Anesthesia Preprocedure Evaluation (Signed)
Anesthesia Evaluation  Patient identified by MRN, date of birth, ID band Patient awake    Reviewed: Allergy & Precautions, H&P , NPO status , Patient's Chart, lab work & pertinent test results, reviewed documented beta blocker date and time   History of Anesthesia Complications Negative for: history of anesthetic complications  Airway Mallampati: I  TM Distance: >3 FB Neck ROM: full    Dental  (+) Dental Advidsory Given, Teeth Intact   Pulmonary neg pulmonary ROS, former smoker,    Pulmonary exam normal breath sounds clear to auscultation       Cardiovascular Exercise Tolerance: Good negative cardio ROS Normal cardiovascular exam Rhythm:regular Rate:Normal     Neuro/Psych negative neurological ROS  negative psych ROS   GI/Hepatic Neg liver ROS, GERD  ,  Endo/Other  negative endocrine ROS  Renal/GU negative Renal ROS  negative genitourinary   Musculoskeletal   Abdominal   Peds  Hematology negative hematology ROS (+)   Anesthesia Other Findings Past Medical History: 11/2004: Cancer ([place])     Comment:  Melanoma, R Knee by Dr Nehemiah Massed No date: Diverticulosis     Comment:  DIVERTICULOSIS ISCHEMIC COLITIS VIA COLONOSCOPY(DR.               SEIGAL):(01/2001) 06/27/2010: Hx of dysplastic nevus     Comment:  L dorsum mid lat. foot   Reproductive/Obstetrics negative OB ROS                             Anesthesia Physical  Anesthesia Plan  ASA: II  Anesthesia Plan: General   Post-op Pain Management:    Induction: Intravenous  PONV Risk Score and Plan: 2 and Propofol infusion and TIVA  Airway Management Planned: Natural Airway and Nasal Cannula  Additional Equipment:   Intra-op Plan:   Post-operative Plan:   Informed Consent: I have reviewed the patients History and Physical, chart, labs and discussed the procedure including the risks, benefits and alternatives for the proposed  anesthesia with the patient or authorized representative who has indicated his/her understanding and acceptance.       Plan Discussed with: Anesthesiologist, CRNA and Surgeon  Anesthesia Plan Comments:         Anesthesia Quick Evaluation

## 2022-05-29 NOTE — Transfer of Care (Signed)
Immediate Anesthesia Transfer of Care Note  Patient: Charles Collins  Procedure(s) Performed: COLONOSCOPY WITH BIOSPY (Rectum) POLYPECTOMY (Rectum)  Patient Location: PACU  Anesthesia Type: No value filed.  Level of Consciousness: awake, alert  and patient cooperative  Airway and Oxygen Therapy: Patient Spontanous Breathing and Patient connected to supplemental oxygen  Post-op Assessment: Post-op Vital signs reviewed, Patient's Cardiovascular Status Stable, Respiratory Function Stable, Patent Airway and No signs of Nausea or vomiting  Post-op Vital Signs: Reviewed and stable  Complications: No notable events documented.

## 2022-05-29 NOTE — Op Note (Signed)
[facility] Gastroenterology Patient Name: Charles Collins Procedure Date: 05/29/2022 8:06 AM MRN: [id number] Account #: [id number] Date of Birth: 10-Jul-1956 Admit Type: Outpatient Age: 66 Room: [facility] OR ROOM 01 Gender: Male Note Status: Finalized Instrument Name: [id number] Procedure:             Colonoscopy Indications:           Last colonoscopy: May 2002, Follow-up of diverticulitis Providers:             Lin Landsman MD, MD Referring MD:          Elveria Rising. Damita Dunnings, MD (Referring MD) Medicines:             General Anesthesia Complications:         No immediate complications. Estimated blood loss: None. Procedure:             Pre-Anesthesia Assessment:                        - Prior to the procedure, a History and Physical was                         performed, and patient medications and allergies were                         reviewed. The patient is competent. The risks and                         benefits of the procedure and the sedation options and                         risks were discussed with the patient. All questions                         were answered and informed consent was obtained.                         Patient identification and proposed procedure were                         verified by the physician, the nurse, the                         anesthesiologist, the anesthetist and the technician                         in the pre-procedure area in the procedure room in the                         endoscopy suite. Mental Status Examination: alert and                         oriented. Airway Examination: normal oropharyngeal                         airway and neck mobility. Respiratory Examination:                         clear to auscultation. CV Examination: normal.  Prophylactic Antibiotics: The patient does not require                         prophylactic antibiotics. Prior Anticoagulants: The                          patient has taken no previous anticoagulant or                         antiplatelet agents. ASA Grade Assessment: II - A                         patient with mild systemic disease. After reviewing                         the risks and benefits, the patient was deemed in                         satisfactory condition to undergo the procedure. The                         anesthesia plan was to use general anesthesia.                         Immediately prior to administration of medications,                         the patient was re-assessed for adequacy to receive                         sedatives. The heart rate, respiratory rate, oxygen                         saturations, blood pressure, adequacy of pulmonary                         ventilation, and response to care were monitored                         throughout the procedure. The physical status of the                         patient was re-assessed after the procedure.                        After obtaining informed consent, the colonoscope was                         passed under direct vision. Throughout the procedure,                         the patient's blood pressure, pulse, and oxygen                         saturations were monitored continuously. The was                         introduced through the anus and advanced to the 10 cm  into the ileum. The colonoscopy was unusually                         difficult due to multiple diverticula in the colon and                         restricted mobility of the colon. Successful                         completion of the procedure was aided by applying                         abdominal pressure. The quality of the bowel                         preparation was evaluated using the BBPS [facility] Bowel                         Preparation Scale) with scores of: Right Colon = 3,                         Transverse Colon = 3 and Left Colon = 3 (entire mucosa                          seen well with no residual staining, small fragments                         of stool or opaque liquid). The total BBPS score                         equals 9. Findings:      Pediatric colonoscope was used      The perianal and digital rectal examinations were normal. Pertinent       negatives include normal sphincter tone and no palpable rectal lesions.      The terminal ileum appeared normal.      A 5 mm polyp was found in the proximal transverse colon. The polyp was       sessile. The polyp was removed with a cold snare. Resection and       retrieval were complete.      Multiple diverticula were found in the recto-sigmoid colon and sigmoid       colon. There was narrowing of the colon in association with the       diverticular opening. There was no evidence of diverticular bleeding.      Non-bleeding external hemorrhoids were found during retroflexion. The       hemorrhoids were medium-sized. Impression:            - The examined portion of the ileum was normal.                        - One 5 mm polyp in the proximal transverse colon,                         removed with a cold snare. Resected and retrieved.                        - Severe  diverticulosis in the recto-sigmoid colon and                         in the sigmoid colon. There was narrowing of the colon                         in association with the diverticular opening. There                         was no evidence of diverticular bleeding.                        - Non-bleeding external hemorrhoids. Recommendation:        - Discharge patient to home (with spouse).                        - Resume previous diet today.                        - Continue present medications.                        - Await pathology results.                        - Repeat colonoscopy in 5 to 7 years for surveillance                         based on pathology results. Procedure Code(s):     --- Professional ---                         [phone], Colonoscopy, flexible; with removal of                         tumor(s), polyp(s), or other lesion(s) by snare                         technique Diagnosis Code(s):     --- Professional ---                        K64.4, Residual hemorrhoidal skin tags                        K63.5, Polyp of colon                        K57.32, Diverticulitis of large intestine without                         perforation or abscess without bleeding                        K57.30, Diverticulosis of large intestine without                         perforation or abscess without bleeding CPT copyright 2019 American Medical Association. All rights reserved. The codes documented in this report are preliminary and upon coder review may  be revised to meet current compliance requirements. Dr. Ulyess Mort Lin Landsman MD, MD 05/29/2022  8:43:15 AM This report has been signed electronically. Number of Addenda: 0 Note Initiated On: 05/29/2022 8:06 AM Scope Withdrawal Time: 0 hours 12 minutes 7 seconds  Total Procedure Duration: 0 hours 22 minutes 52 seconds  Estimated Blood Loss:  Estimated blood loss: none.      [facility]

## 2022-05-29 NOTE — H&P (Signed)
Charles Darby, MD [address]  [place]  [place], [place] [address]  Main: [phone]  Fax: [phone] Pager: [phone]  Primary Care Physician:  Tonia Ghent, MD Primary Gastroenterologist:  Dr. Cephas Collins  Pre-Procedure History & Physical: HPI:  Charles Collins is a 66 y.o. male is here for an colonoscopy.   Past Medical History:  Diagnosis Date   Cancer ([place]) 11/2004   Melanoma, R Knee by Dr Nehemiah Massed   Diverticulitis 04/27/2022   Diverticulosis    DIVERTICULOSIS ISCHEMIC COLITIS VIA COLONOSCOPY(DR. SEIGAL):(01/2001)   H. pylori infection    Hx of dysplastic nevus 06/27/2010   L dorsum mid lat. foot   Melanoma ([place])    R knee     Past Surgical History:  Procedure Laterality Date   APPENDECTOMY     As Teen   ESOPHAGOGASTRODUODENOSCOPY (EGD) WITH PROPOFOL N/A 04/10/2020   Procedure: ESOPHAGOGASTRODUODENOSCOPY (EGD) WITH PROPOFOL;  Surgeon: Lin Landsman, MD;  Location: [facility] ENDOSCOPY;  Service: Gastroenterology;  Laterality: N/A;   KNEE SURGERY     Open Repair Right as Teen   MELANOMA EXCISION Right 2006   Knee. Clark's level IV, Breslow's 0.34m    Prior to Admission medications   Medication Sig Start Date End Date Taking? Authorizing Provider  Ascorbic Acid (VITAMIN C PO) Take by mouth daily.   Yes [provider]  Chlorpheniramine Maleate (ALLERGY PO) Take by mouth.   Yes [provider]  MISC NATURAL PRODUCTS EX Apply topically.   Yes [provider]  Multiple Vitamins-Minerals (ZINC PO) Take by mouth daily.   Yes [provider]  amoxicillin (AMOXIL) 875 MG tablet Take 1 tablet (875 mg total) by mouth 2 (two) times daily for 7 days. Patient not taking: Reported on 05/29/2022 05/23/22 05/30/22  HScot Jun FNP    Allergies as of 05/13/2022 - Review Complete 05/13/2022  Allergen Reaction Noted   Diclofenac Other (See Comments) 05/08/2020   Nsaids  04/20/2020   Prilosec [omeprazole] Other (See  Comments) 01/05/2013    Family History  Problem Relation Age of Onset   Stroke Mother 361      Cause of Death, Hemmorhage   Diabetes Father    Hypertension Father    Obesity Father    Kidney disease Father    Kidney cancer Father    Pancreatic cancer Father        presumed, no biopsy done   Hypertension Paternal Grandfather    Heart disease Neg Hx    Cancer Neg Hx    Depression Neg Hx    Drug abuse Neg Hx    Alcohol abuse Neg Hx    Colon cancer Neg Hx    Prostate cancer Neg Hx     Social History   Socioeconomic History   Marital status: Married    Spouse name: Not on file   Number of children: 2   Years of education: Not on file   Highest education level: Not on file  Occupational History   Occupation: [employer] administrator Tobacco Use   Smoking status: Former    Packs/day: 0.02    Years: 2.00    Total pack years: 0.04    Types: Cigarettes    Quit date: 11/03/1968    Years since quitting: 53.6   Smokeless tobacco: Current    Types: Chew   Tobacco comments:    quit over 30 years ago, smoked briefly as a teenager  Vaping Use   Vaping  Use: Never used  Substance and Sexual Activity   Alcohol use: No    Alcohol/week: 0.0 standard drinks of alcohol   Drug use: No   Sexual activity: Yes  Other Topics Concern   Not on file  Social History Narrative   Marital Status: Married [place]: 2 [place]   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    [employer] fan   4 grandkids   Social Determinants of Health   Financial Resource Strain: Not on file  Food Insecurity: Not on file  Transportation Needs: Not on file  Physical Activity: Not on file  Stress: Not on file  Social Connections: Not on file  Intimate Partner Violence: Not on file    Review of Systems: See HPI, otherwise negative ROS  Physical Exam: BP 132/80   Pulse 67   Temp (!) 97.3 F (36.3 C)  (Temporal)   Ht '5\' 8"'$  (1.727 m)   Wt 70 kg   SpO2 99%   BMI 23.48 kg/m  General:   Alert,  pleasant and cooperative in NAD Head:  Normocephalic and atraumatic. Neck:  Supple; no masses or thyromegaly. Lungs:  Clear throughout to auscultation.    Heart:  Regular rate and rhythm. Abdomen:  Soft, nontender and nondistended. Normal bowel sounds, without guarding, and without rebound.   Neurologic:  Alert and  oriented x4;  grossly normal neurologically.  Impression/Plan: Charles Collins is here for an colonoscopy to be performed for h/o acute diverticulitis  Risks, benefits, limitations, and alternatives regarding  colonoscopy have been reviewed with the patient.  Questions have been answered.  All parties agreeable.   Sherri Sear, MD  05/29/2022, 7:32 AM

## 2022-05-29 NOTE — Anesthesia Postprocedure Evaluation (Signed)
Anesthesia Post Note  Patient: Charles Collins  Procedure(s) Performed: COLONOSCOPY WITH BIOSPY (Rectum) POLYPECTOMY (Rectum)     Patient location during evaluation: PACU Anesthesia Type: General Level of consciousness: awake and alert Pain management: pain level controlled Vital Signs Assessment: post-procedure vital signs reviewed and stable Respiratory status: spontaneous breathing, nonlabored ventilation, respiratory function stable and patient connected to nasal cannula oxygen Cardiovascular status: blood pressure returned to baseline and stable Postop Assessment: no apparent nausea or vomiting Anesthetic complications: no   No notable events documented.  Wanda Plump Charles Collins

## 2022-05-30 ENCOUNTER — Encounter: Payer: Self-pay | Admitting: Gastroenterology

## 2022-06-02 ENCOUNTER — Encounter: Payer: Self-pay | Admitting: Gastroenterology

## 2022-06-02 LAB — SURGICAL PATHOLOGY

## 2022-06-26 IMAGING — CT CT ABD-PELV W/ CM
2 of 5 series · 15 of 46 positions shown, 17 images · IV contrast (APPLIED)
Comparison: None Available.

CLINICAL DATA: Abdominal pain.  Epigastric pain

EXAM:
CT ABDOMEN AND PELVIS WITH CONTRAST
TECHNIQUE: Multidetector CT imaging of the abdomen and pelvis was performed
using the standard protocol following bolus administration of
intravenous contrast.

[Series 2: abdomen 5.0 · axial · 0.71mm/px · z∈[-919,-474]mm · 12 of 105 slices shown, 14 images]
[im 8/105  soft-tissue]
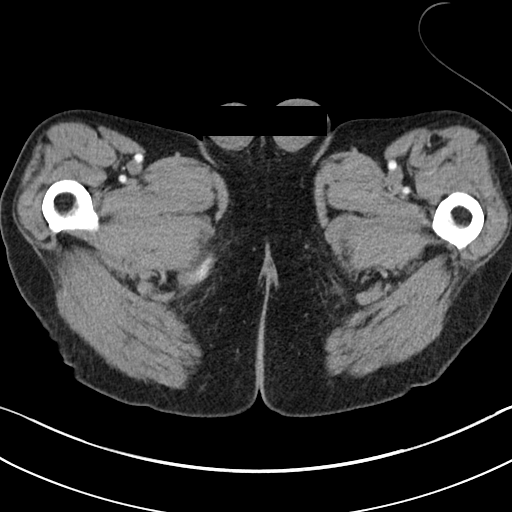
[im 8/105  bone]
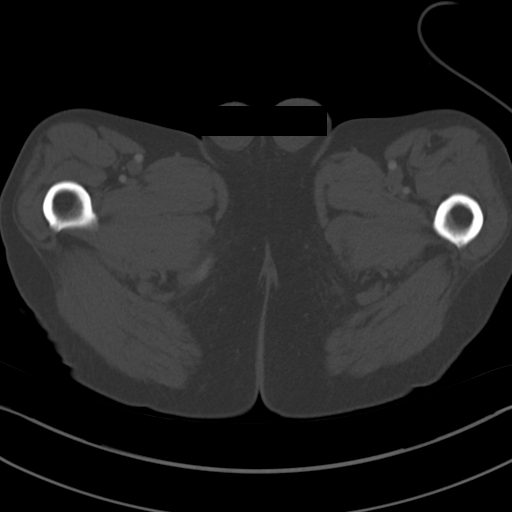
[im 15/105  soft-tissue]
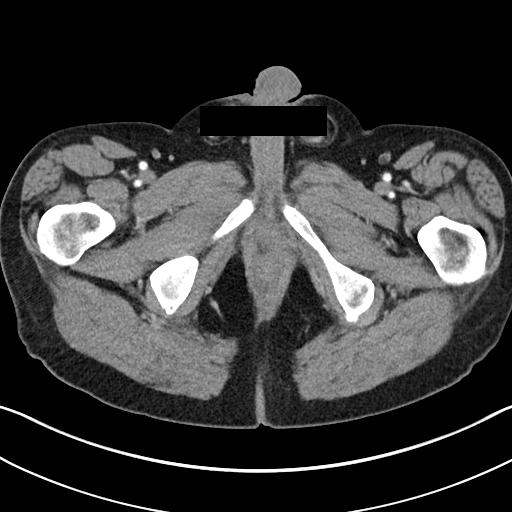
[im 23/105  soft-tissue]
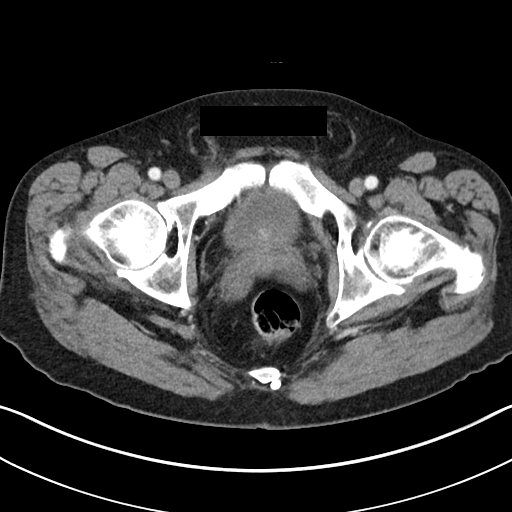
[im 30/105  soft-tissue]
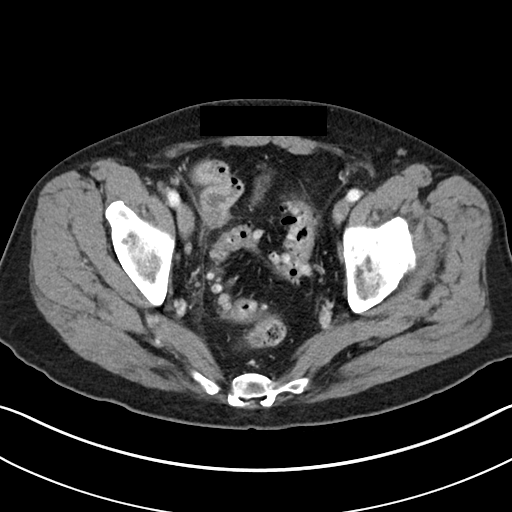
[im 38/105  soft-tissue]
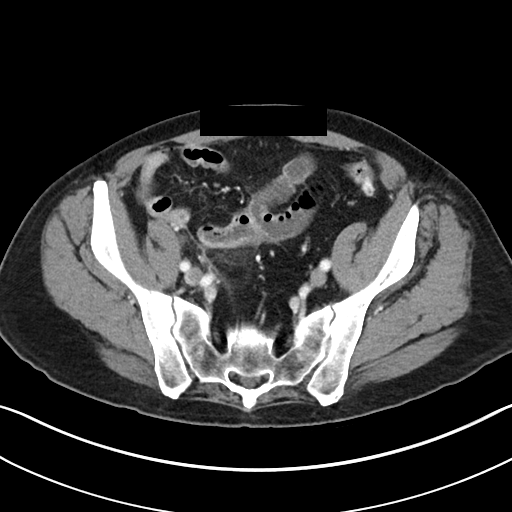
[im 45/105  soft-tissue]
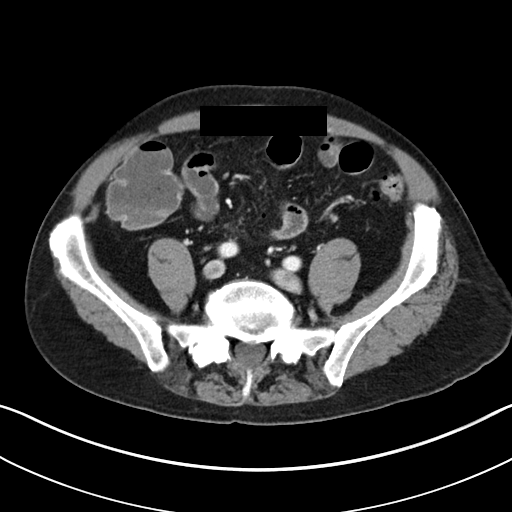
[im 60/105  soft-tissue]
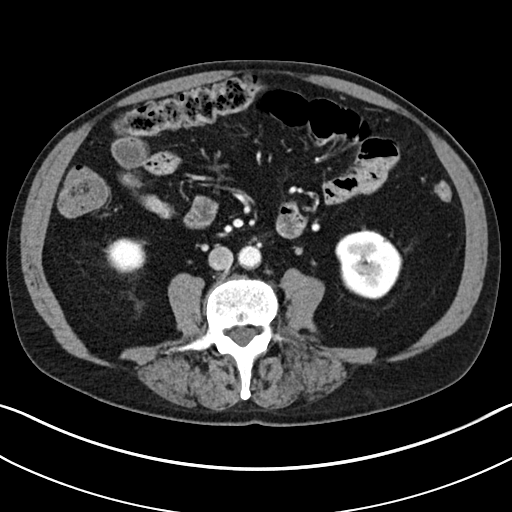
[im 67/105  soft-tissue]
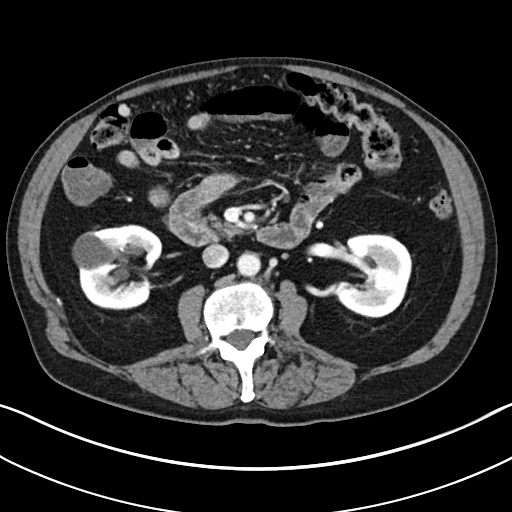
[im 75/105  soft-tissue]
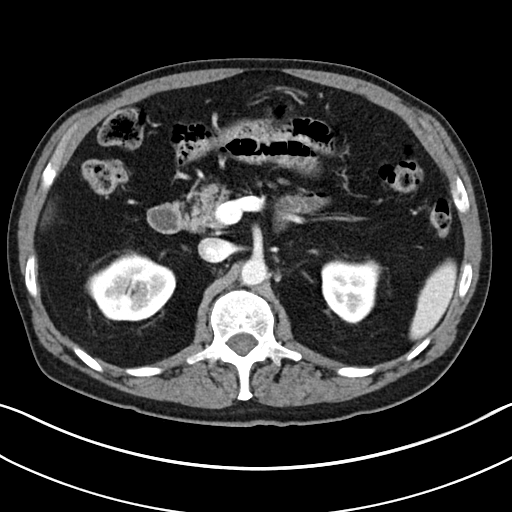
[im 75/105  bone]
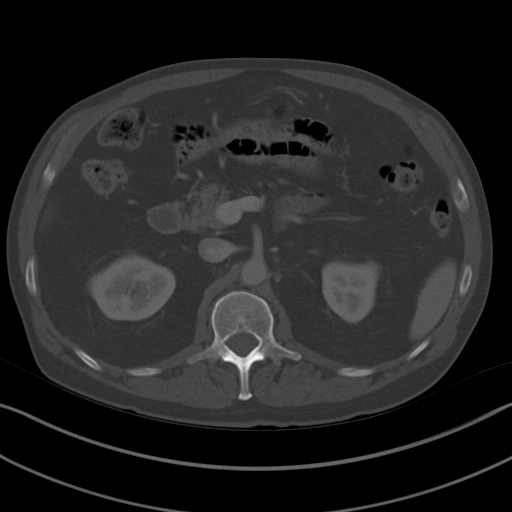
[im 82/105  soft-tissue]
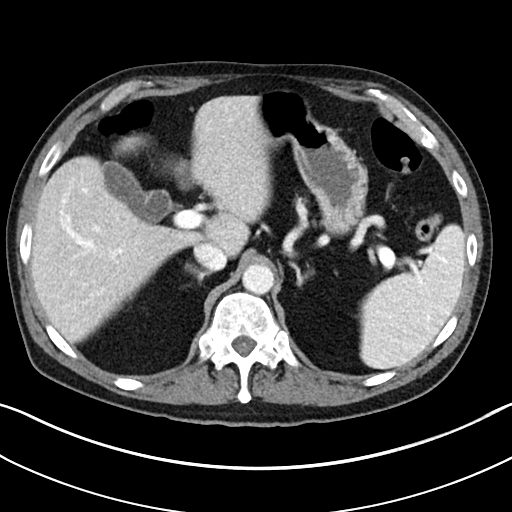
[im 90/105  soft-tissue]
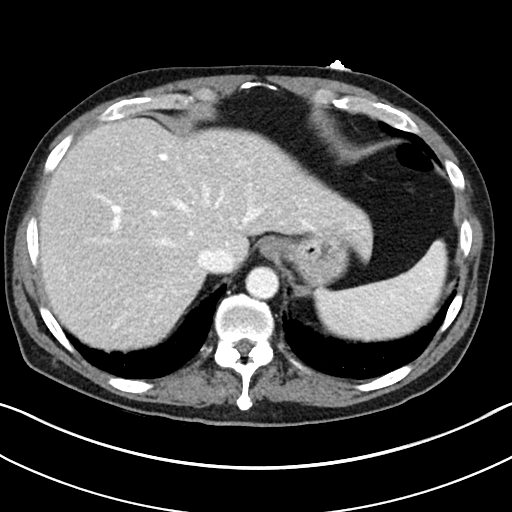
[im 97/105  soft-tissue]
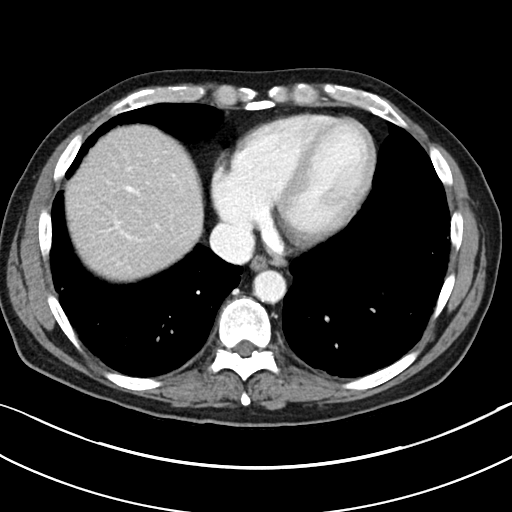

[Series 5: abdomen 3.0 mpr cor · coronal · 0.77mm/px · 3 of 88 slices shown]
[im 30/88  soft-tissue]
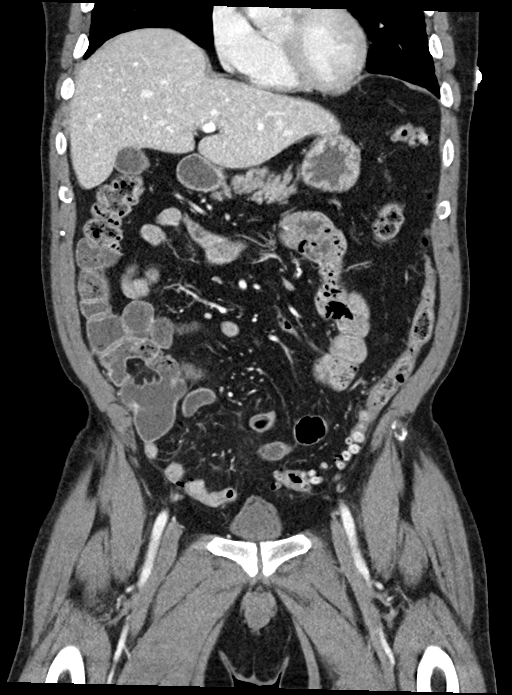
[im 39/88  soft-tissue]
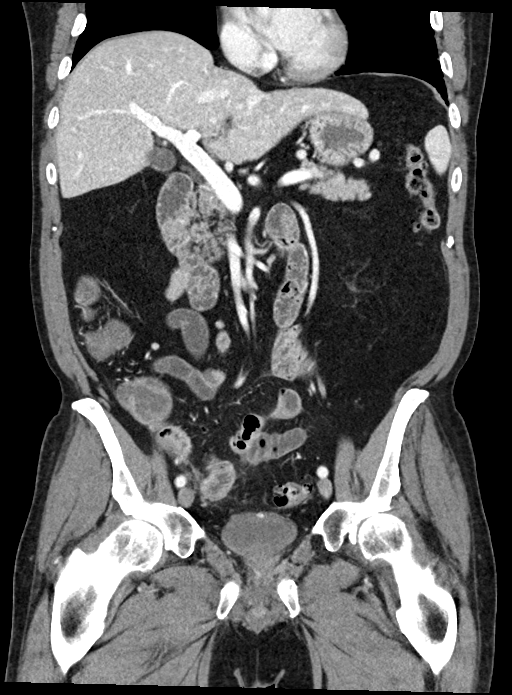
[im 49/88  soft-tissue]
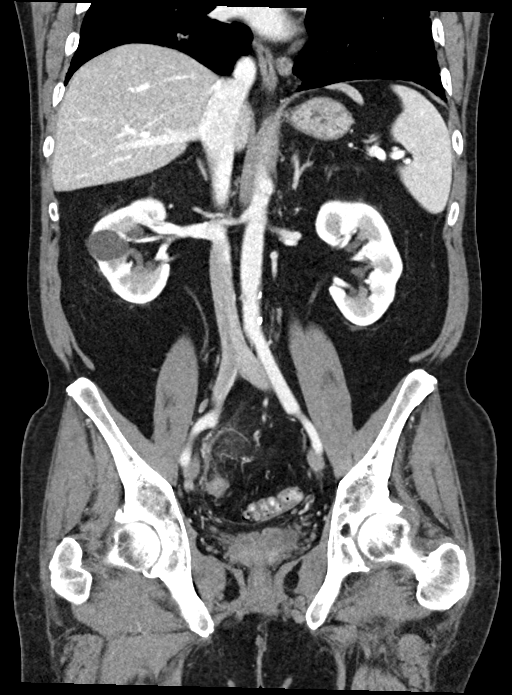

[15 of 46 positions shown; findings below may reference images not displayed]

RADIATION DOSE REDUCTION: This exam was performed according to the
departmental dose-optimization program which includes automated
exposure control, adjustment of the mA and/or kV according to
patient size and/or use of iterative reconstruction technique.

CONTRAST:  100mL OMNIPAQUE IOHEXOL 300 MG/ML  SOLN
FINDINGS: Lower chest: Lung bases are clear.

Hepatobiliary: No focal hepatic lesion. No biliary duct dilatation.
Common bile duct is normal.

Pancreas: Pancreas is normal. No ductal dilatation. No pancreatic
inflammation.

Spleen: Normal spleen

Adrenals/urinary tract: Adrenal glands are normal. Simple fluid
attenuation 28 mm cyst in the cortex of the RIGHT kidney. No
obstructive uropathy. Bladder normal

Stomach/Bowel: Stomach, duodenum small-bowel normal. Appendix not
identified. No secondary signs of appendicitis. The ascending
transverse colon normal. Scattered diverticula descending colon.
Multiple diverticula sigmoid colon. Mild stranding within the
sigmoid mesocolon (for example image 78/series 2). This inflammation
is along the horizontal portion of sigmoid colon. There is no frank
perforation or abscess. This mild inflammatory stranding is
identified on coronal image 47/5. rectum is normal.

Vascular/Lymphatic: Abdominal aorta is normal caliber. No periportal
or retroperitoneal adenopathy. No pelvic adenopathy.

Reproductive: Prostate unremarkable.

Other: No intraperitoneal free air

Musculoskeletal: No aggressive osseous lesion.
IMPRESSION: 1. Mild inflammatory process in the deep RIGHT pelvis is favored
mild sigmoid diverticulitis. Consider follow-up imaging to
demonstrate resolution.
2. No perforation.  No abscess.
3. Benign cyst of the RIGHT kidney.  No follow-up recommended.

## 2022-09-01 ENCOUNTER — Encounter (INDEPENDENT_AMBULATORY_CARE_PROVIDER_SITE_OTHER): Payer: Self-pay

## 2022-10-21 ENCOUNTER — Ambulatory Visit: Payer: Medicare Other | Admitting: Podiatry

## 2022-10-22 ENCOUNTER — Ambulatory Visit (INDEPENDENT_AMBULATORY_CARE_PROVIDER_SITE_OTHER): Payer: Medicare Other | Admitting: Dermatology

## 2022-10-22 VITALS — BP 132/73 | HR 72

## 2022-10-22 DIAGNOSIS — Z79899 Other long term (current) drug therapy: Secondary | ICD-10-CM

## 2022-10-22 DIAGNOSIS — L853 Xerosis cutis: Secondary | ICD-10-CM

## 2022-10-22 DIAGNOSIS — D692 Other nonthrombocytopenic purpura: Secondary | ICD-10-CM

## 2022-10-22 DIAGNOSIS — Z85820 Personal history of malignant melanoma of skin: Secondary | ICD-10-CM

## 2022-10-22 DIAGNOSIS — Z1283 Encounter for screening for malignant neoplasm of skin: Secondary | ICD-10-CM | POA: Diagnosis not present

## 2022-10-22 DIAGNOSIS — L570 Actinic keratosis: Secondary | ICD-10-CM

## 2022-10-22 DIAGNOSIS — Z5111 Encounter for antineoplastic chemotherapy: Secondary | ICD-10-CM | POA: Diagnosis not present

## 2022-10-22 DIAGNOSIS — L578 Other skin changes due to chronic exposure to nonionizing radiation: Secondary | ICD-10-CM

## 2022-10-22 DIAGNOSIS — L820 Inflamed seborrheic keratosis: Secondary | ICD-10-CM

## 2022-10-22 DIAGNOSIS — L821 Other seborrheic keratosis: Secondary | ICD-10-CM

## 2022-10-22 DIAGNOSIS — Z86018 Personal history of other benign neoplasm: Secondary | ICD-10-CM

## 2022-10-22 DIAGNOSIS — D229 Melanocytic nevi, unspecified: Secondary | ICD-10-CM

## 2022-10-22 DIAGNOSIS — L814 Other melanin hyperpigmentation: Secondary | ICD-10-CM

## 2022-10-22 MED ORDER — FLUOROURACIL 5 % EX CREA: TOPICAL_CREAM | CUTANEOUS | 0 refills | Status: DC

## 2022-10-22 NOTE — Progress Notes (Signed)
Follow-Up Visit   Subjective  Charles Collins is a 66 y.o. male who presents for the following: Annual Exam (Hx MM R knee, hx dysplastic nevus - irregular skin lesion on the R forearm that is itchy and comes and goes). The patient presents for Total-Body Skin Exam (TBSE) for skin cancer screening and mole check.  The patient has spots, moles and lesions to be evaluated, some may be new or changing and the patient has concerns that these could be cancer.  The following portions of the chart were reviewed this encounter and updated as appropriate:   Tobacco  Allergies  Meds  Problems  Med Hx  Surg Hx  Fam Hx     Review of Systems:  No other skin or systemic complaints except as noted in HPI or Assessment and Plan.  Objective  Well appearing patient in no apparent distress; mood and affect are within normal limits.  A full examination was performed including scalp, head, eyes, ears, nose, lips, neck, chest, axillae, abdomen, back, buttocks, bilateral upper extremities, bilateral lower extremities, hands, feet, fingers, toes, fingernails, and toenails. All findings within normal limits unless otherwise noted below.  Face and ears x 6 (6) Erythematous thin papules/macules with gritty scale.   L scalp x 1, R upper back x 1, R forearm x 1 (3) Erythematous stuck-on, waxy papule or plaque   Assessment & Plan  AK (actinic keratosis) (6) Face and ears x 6  Start 5FU/Calcipotriene cream BID x 1 week to the forehead and temples.  Destruction of lesion - Face and ears x 6 Complexity: simple   Destruction method: cryotherapy   Informed consent: discussed and consent obtained   Timeout:  patient name, date of birth, surgical site, and procedure verified Lesion destroyed using liquid nitrogen: Yes   Region frozen until ice ball extended beyond lesion: Yes   Outcome: patient tolerated procedure well with no complications   Post-procedure details: wound care instructions given     fluorouracil (EFUDEX) 5 % cream - Face and ears x 6 Starting at the end of January apply to the forehead and temples BID x 1 week.  Inflamed seborrheic keratosis (3) L scalp x 1, R upper back x 1, R forearm x 1 Symptomatic, irritating, patient would like treated. Destruction of lesion - L scalp x 1, R upper back x 1, R forearm x 1 Complexity: simple   Destruction method: cryotherapy   Informed consent: discussed and consent obtained   Timeout:  patient name, date of birth, surgical site, and procedure verified Lesion destroyed using liquid nitrogen: Yes   Region frozen until ice ball extended beyond lesion: Yes   Outcome: patient tolerated procedure well with no complications   Post-procedure details: wound care instructions given    Lentigines - Scattered tan macules - Due to sun exposure - Benign-appearing, observe - Recommend daily broad spectrum sunscreen SPF 30+ to sun-exposed areas, reapply every 2 hours as needed. - Call for any changes  Seborrheic Keratoses - Stuck-on, waxy, tan-brown papules and/or plaques  - Benign-appearing - Discussed benign etiology and prognosis. - Observe - Call for any changes  Melanocytic Nevi - Tan-brown and/or pink-flesh-colored symmetric macules and papules - Benign appearing on exam today - Observation - Call clinic for new or changing moles - Recommend daily use of broad spectrum spf 30+ sunscreen to sun-exposed areas.   Hemangiomas - Red papules - Discussed benign nature - Observe - Call for any changes  Actinic Damage with PreCancerous Actinic  Keratoses Counseling for Topical Chemotherapy Management: Patient exhibits: - Severe, confluent actinic changes with pre-cancerous actinic keratoses that is secondary to cumulative UV radiation exposure over time - Condition that is severe; chronic, not at goal. - diffuse scaly erythematous macules and papules with underlying dyspigmentation - Discussed Prescription "Field Treatment"  topical Chemotherapy for Severe, Chronic Confluent Actinic Changes with Pre-Cancerous Actinic Keratoses Field treatment involves treatment of an entire area of skin that has confluent Actinic Changes (Sun/ Ultraviolet light damage) and PreCancerous Actinic Keratoses by method of PhotoDynamic Therapy (PDT) and/or prescription Topical Chemotherapy agents such as 5-fluorouracil, 5-fluorouracil/calcipotriene, and/or imiquimod.  The purpose is to decrease the number of clinically evident and subclinical PreCancerous lesions to prevent progression to development of skin cancer by chemically destroying early precancer changes that may or may not be visible.  It has been shown to reduce the risk of developing skin cancer in the treated area. As a result of treatment, redness, scaling, crusting, and open sores may occur during treatment course. One or more than one of these methods may be used and may have to be used several times to control, suppress and eliminate the PreCancerous changes. Discussed treatment course, expected reaction, and possible side effects. - Recommend daily broad spectrum sunscreen SPF 30+ to sun-exposed areas, reapply every 2 hours as needed.  - Staying in the shade or wearing long sleeves, sun glasses (UVA+UVB protection) and wide brim hats (4-inch brim around the entire circumference of the hat) are also recommended. - Call for new or changing lesions.  History of Melanoma - No evidence of recurrence today - No lymphadenopathy - Recommend regular full body skin exams - Recommend daily broad spectrum sunscreen SPF 30+ to sun-exposed areas, reapply every 2 hours as needed.  - Call if any new or changing lesions are noted between office visits  History of Dysplastic Nevi - No evidence of recurrence today - Recommend regular full body skin exams - Recommend daily broad spectrum sunscreen SPF 30+ to sun-exposed areas, reapply every 2 hours as needed.  - Call if any new or changing  lesions are noted between office visits  Purpura - Chronic; persistent and recurrent.  Treatable, but not curable. - Violaceous macules and patches - Benign - Related to trauma, age, sun damage and/or use of blood thinners, chronic use of topical and/or oral steroids - Observe - Can use OTC arnica containing moisturizer such as Dermend Bruise Formula if desired - Call for worsening or other concerns  Xerosis - diffuse xerotic patches - recommend gentle, hydrating skin care - gentle skin care handout given  Skin cancer screening performed today.  Return in about 3 months (around 01/21/2023) for AK follow up .  Luther Redo, CMA, am acting as scribe for Sarina Ser, MD . Documentation: I have reviewed the above documentation for accuracy and completeness, and I agree with the above.  Sarina Ser, MD

## 2022-10-22 NOTE — Patient Instructions (Addendum)
For actinic keratosis (precancerous skin lesions) - At the end of January start fluorouracil/calcipotriene mix twice daily for one week to the forehead and temples.   Instructions for [facility] Medications  One or more of your medications was sent to the [facility] mail order compounding pharmacy. You will receive an email from them and can purchase the medicine through that link. It will then be mailed to your home at the address you confirmed. If for any reason you do not receive an email from them, please check your spam folder. If you still do not find the email, please let us know. [facility] phone number is [phone].  5-Fluorouracil/Calcipotriene Patient Education   Actinic keratoses are the dry, red scaly spots on the skin caused by sun damage. A portion of these spots can turn into skin cancer with time, and treating them can help prevent development of skin cancer.   Treatment of these spots requires removal of the defective skin cells. There are various ways to remove actinic keratoses, including freezing with liquid nitrogen, treatment with creams, or treatment with a blue light procedure in the office.   5-fluorouracil cream is a topical cream used to treat actinic keratoses. It works by interfering with the growth of abnormal fast-growing skin cells, such as actinic keratoses. These cells peel off and are replaced by healthy ones. THIS CREAM SHOULD BE KEPT OUT OF REACH OF CHILDREN AND PETS AND SHOULD NOT BE USED BY PREGNANT WOMEN.  5-fluorouracil/calcipotriene is a combination of the 5-fluorouracil cream with a vitamin D analog cream called calcipotriene. The calcipotriene alone does not treat actinic keratoses. However, when it is combined with 5-fluorouracil, it helps the 5-fluorouracil treat the actinic keratoses much faster so that the same results can be achieved with a much shorter treatment time.  INSTRUCTIONS FOR 5-FLUOROURACIL/CALCIPOTRIENE CREAM:    5-fluorouracil/calcipotriene cream typically only needs to be used for 4-7 days. A thin layer should be applied twice a day to the treatment areas recommended by your physician.   If your physician prescribed you separate tubes of 5-fluourouracil and calcipotriene, apply a thin layer of 5-fluorouracil followed by a thin layer of calcipotriene.   Avoid contact with your eyes or nostrils. Avoid applying the cream to your eyelids or lips unless directed to apply there by your physician. Do not use 5-fluorouracil/calcipotriene cream on infected or open wounds.   You will develop redness, irritation and some crusting at areas where you have pre-cancer damage/actinic keratoses. IF YOU DEVELOP PAIN, BLEEDING, OR SIGNIFICANT CRUSTING, STOP THE TREATMENT EARLY - you have already gotten a good response and the actinic keratoses should clear up well.  Wash your hands after applying 5-fluorouracil 5% cream on your skin.   A moisturizer or sunscreen with a minimum SPF 30 should be applied each morning.   Once you have finished the treatment, you can apply a thin layer of Vaseline twice a day to irritated areas to soothe and calm the areas more quickly. If you experience significant discomfort, contact your physician.  For some patients it is necessary to repeat the treatment for best results.  SIDE EFFECTS: When using 5-fluorouracil/calcipotriene cream, you may have mild irritation, such as redness, dryness, swelling, or a mild burning sensation. This usually resolves within 2 weeks. The more actinic keratoses you have, the more redness and inflammation you can expect during treatment. Eye irritation has been reported rarely. If this occurs, please let us know.   If you have any trouble using this cream,  please send [place] a MyChart message or call the office. If you have any other questions about this information, please do not hesitate to ask me before you leave the office or contact me on MyChart or by  phone.     Due to recent changes in healthcare laws, you may see results of your pathology and/or laboratory studies on MyChart before the doctors have had a chance to review them. We understand that in some cases there may be results that are confusing or concerning to you. Please understand that not all results are received at the same time and often the doctors may need to interpret multiple results in order to provide you with the best plan of care or course of treatment. Therefore, we ask that you please give [place] 2 business days to thoroughly review all your results before contacting the office for clarification. Should we see a critical lab result, you will be contacted sooner.   If You Need Anything After Your Visit  If you have any questions or concerns for your doctor, please call our main line at [phone] and press option 4 to reach your doctor's medical assistant. If no one answers, please leave a voicemail as directed and we will return your call as soon as possible. Messages left after 4 pm will be answered the following business day.   You may also send [place] a message via [place]. We typically respond to MyChart messages within 1-2 business days.  For prescription refills, please ask your pharmacy to contact our office. Our fax number is [phone].  If you have an urgent issue when the clinic is closed that cannot wait until the next business day, you can page your doctor at the number below.    Please note that while we do our best to be available for urgent issues outside of office hours, we are not available 24/7.   If you have an urgent issue and are unable to reach [place], you may choose to seek medical care at your doctor's office, retail clinic, urgent care center, or emergency room.  If you have a medical emergency, please immediately call 911 or go to the emergency department.  Pager Numbers  - Dr. Nehemiah Massed: [phone]  - Dr. Laurence Ferrari: [phone]  - Dr. Nicole Kindred:  [phone]  In the event of inclement weather, please call our main line at [phone] for an update on the status of any delays or closures.  Dermatology Medication Tips: Please keep the boxes that topical medications come in in order to help keep track of the instructions about where and how to use these. Pharmacies typically print the medication instructions only on the boxes and not directly on the medication tubes.   If your medication is too expensive, please contact our office at [phone] option 4 or send [place] a message through [place].   We are unable to tell what your co-pay for medications will be in advance as this is different depending on your insurance coverage. However, we may be able to find a substitute medication at lower cost or fill out paperwork to get insurance to cover a needed medication.   If a prior authorization is required to get your medication covered by your insurance company, please allow [place] 1-2 business days to complete this process.  Drug prices often vary depending on where the prescription is filled and some pharmacies may offer cheaper prices.  The website www.goodrx.com contains coupons for medications through different pharmacies. The prices here do not account  for what the cost may be with help from insurance (it may be cheaper with your insurance), but the website can give you the price if you did not use any insurance.  - You can print the associated coupon and take it with your prescription to the pharmacy.  - You may also stop by our office during regular business hours and pick up a GoodRx coupon card.  - If you need your prescription sent electronically to a different pharmacy, notify our office through [facility] or by phone at [phone] option 4.     Si Usted Necesita Algo Despus de Su Visita  Tambin puede enviarnos un mensaje a travs de Pharmacist, community. Por lo general respondemos a los mensajes de MyChart en el transcurso de 1 a 2  das hbiles.  Para renovar recetas, por favor pida a su farmacia que se ponga en contacto con nuestra oficina. Harland Dingwall de fax es [place] [phone].  Si tiene un asunto urgente cuando la clnica est cerrada y que no puede esperar hasta el siguiente da hbil, puede llamar/localizar a su doctor(a) al nmero que aparece a continuacin.   Por favor, tenga en cuenta que aunque hacemos todo lo posible para estar disponibles para asuntos urgentes fuera del horario de [place], no estamos disponibles las 24 horas del da, los 7 das de la [place].   Si tiene un problema urgente y no puede comunicarse con nosotros, puede optar por buscar atencin mdica  en el consultorio de su doctor(a), en una clnica privada, en un centro de atencin urgente o en una sala de emergencias.  Si tiene Engineering geologist, por favor llame inmediatamente al 911 o vaya a la sala de emergencias.  Nmeros de bper  - Dr. Nehemiah Massed: [phone]  - Dra. Moye: [phone]  - Dra. Nicole Kindred: [phone]  En caso de inclemencias del [place], por favor llame a Johnsie Kindred principal al [phone] para una actualizacin sobre el [place] de cualquier retraso o cierre.  Consejos para la medicacin en dermatologa: Por favor, guarde las cajas en las que vienen los medicamentos de uso tpico para ayudarle a seguir las instrucciones sobre dnde y cmo usarlos. Las farmacias generalmente imprimen las instrucciones del medicamento slo en las cajas y no directamente en los tubos del [place].   Si su medicamento es muy caro, por favor, pngase en contacto con Zigmund Daniel llamando al [phone] y presione la opcin 4 o envenos un mensaje a travs de Pharmacist, community.   No podemos decirle cul ser su copago por los medicamentos por adelantado ya que esto es diferente dependiendo de la cobertura de su seguro. Sin embargo, es posible que podamos encontrar un medicamento sustituto a Electrical engineer un formulario para que el  seguro cubra el medicamento que se considera necesario.   Si se requiere una autorizacin previa para que su compaa de seguros [place] su medicamento, por favor permtanos de 1 a 2 das hbiles para completar este proceso.  Los precios de los medicamentos varan con frecuencia dependiendo del Environmental consultant de dnde se surte la receta y alguna farmacias pueden ofrecer precios ms baratos.  El sitio web www.goodrx.com tiene cupones para medicamentos de Airline pilot. Los precios aqu no tienen en cuenta lo que podra costar con la ayuda del seguro (puede ser ms barato con su seguro), pero el sitio web puede darle el precio si no utiliz Research scientist (physical sciences).  - Puede imprimir el cupn correspondiente y llevarlo con su receta a la farmacia.  Lavera Guise  puede pasar por nuestra oficina durante el horario de atencin regular y Charity fundraiser una tarjeta de cupones de GoodRx.  - Si necesita que su receta se enve electrnicamente a una farmacia diferente, informe a nuestra oficina a travs de MyChart de [place] o por telfono llamando al [phone] y presione la opcin 4.

## 2022-10-31 ENCOUNTER — Encounter: Payer: Self-pay | Admitting: Dermatology

## 2022-10-31 ENCOUNTER — Ambulatory Visit (INDEPENDENT_AMBULATORY_CARE_PROVIDER_SITE_OTHER): Payer: Medicare Other

## 2022-10-31 ENCOUNTER — Ambulatory Visit (INDEPENDENT_AMBULATORY_CARE_PROVIDER_SITE_OTHER): Payer: Medicare Other | Admitting: Podiatry

## 2022-10-31 DIAGNOSIS — M79672 Pain in left foot: Secondary | ICD-10-CM

## 2022-10-31 DIAGNOSIS — M7672 Peroneal tendinitis, left leg: Secondary | ICD-10-CM

## 2022-10-31 DIAGNOSIS — R52 Pain, unspecified: Secondary | ICD-10-CM | POA: Diagnosis not present

## 2022-10-31 MED ORDER — BETAMETHASONE SOD PHOS & ACET 6 (3-3) MG/ML IJ SUSP
3.0000 mg | Freq: Once | INTRAMUSCULAR | Status: AC
  Administered 2022-10-31: 3 mg via INTRA_ARTICULAR

## 2022-10-31 MED ORDER — METHYLPREDNISOLONE 4 MG PO TBPK: ORAL_TABLET | ORAL | 0 refills | Status: DC

## 2022-10-31 NOTE — Progress Notes (Signed)
Chief Complaint  Patient presents with   Foot Pain    Left foot pain, hurts when walking, hurts when pressure is applied     HPI: 66 y.o. male presenting today for new complaint of pain and tenderness associated to the lateral aspect of the left foot.  Patient had the same condition about a year and a half ago to the right foot.  Injections were administered and he felt significant improvement in the pain eventually resolved.  Patient states over the past 6 months he has been developing gradual onset of pain and tenderness to the lateral aspect of the left foot.  Denies a history of injury.  He has not done anything for treatment.  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 pylori infection    Hx of dysplastic nevus 06/27/2010   L dorsum mid lat. foot   Melanoma ([place])    R knee     Past Surgical History:  Procedure Laterality Date   APPENDECTOMY     As Teen   COLONOSCOPY WITH PROPOFOL N/A 05/29/2022   Procedure: COLONOSCOPY WITH BIOSPY;  Surgeon: Lin Landsman, MD;  Location: [facility];  Service: Endoscopy;  Laterality: N/A;   ESOPHAGOGASTRODUODENOSCOPY (EGD) WITH PROPOFOL N/A 04/10/2020   Procedure: ESOPHAGOGASTRODUODENOSCOPY (EGD) WITH PROPOFOL;  Surgeon: Lin Landsman, MD;  Location: [facility] ENDOSCOPY;  Service: Gastroenterology;  Laterality: N/A;   KNEE SURGERY     Open Repair Right as Teen   MELANOMA EXCISION Right 2006   Knee. Clark's level IV, Breslow's 0.26m   POLYPECTOMY N/A 05/29/2022   Procedure: POLYPECTOMY;  Surgeon: VLin Landsman MD;  [place]  Service: [place]oscopy;  Laterality: N/A;    Allergies  Allergen Reactions   Diclofenac Other (See Comments)    Gastritis   Nsaids     H/o gastritis   Prilosec [Omeprazole] Other (See Comments)    Not an  allergy, but "didn't feel well taking it"      Physical Exam: General: The patient is alert and oriented x3 in no acute distress.  Dermatology: Skin is warm, dry and supple bilateral lower extremities. Negative for open lesions or macerations.  Vascular: Palpable pedal pulses bilaterally. Capillary refill within normal limits.  Negative for any significant edema or erythema  Neurological: Light touch and protective threshold grossly intact  Musculoskeletal Exam: No pedal deformities noted.  Pain on palpation at the insertion of the peroneal tendon onto the fifth metatarsal tubercle left foot consistent with an insertional peroneal tendinitis  Radiographic Exam LT foot 10/31/2022:  Normal osseous mineralization. Joint spaces preserved. No fracture/dislocation/boney destruction.    Assessment: 1.  Insertional peroneal tendinitis left   Plan of Care:  1. Patient evaluated. X-Rays reviewed.  2.  Injection of 0.5 cc Celestone Soluspan injected around the fifth metatarsal tubercle left 3.  Prescription for Medrol Dosepak 4.  Patient developed GI upset with NSAIDs.  No NSAIDs prescribed.  Recommend extra strength Tylenol OTC 5.  Recommend good supportive shoes that do not irritate the lateral aspect of the foot 6.  Return to clinic 4 weeks       BEdrick Kins DPM [facility]  Dr. BEdrick Kins DPM    [address]  [place], [place] [address]                Office [phone]  Fax [phone]

## 2022-12-09 ENCOUNTER — Ambulatory Visit (INDEPENDENT_AMBULATORY_CARE_PROVIDER_SITE_OTHER): Payer: Medicare Other | Admitting: Podiatry

## 2022-12-09 ENCOUNTER — Encounter: Payer: Self-pay | Admitting: Podiatry

## 2022-12-09 VITALS — BP 137/76 | HR 54

## 2022-12-09 DIAGNOSIS — M7672 Peroneal tendinitis, left leg: Secondary | ICD-10-CM

## 2022-12-09 MED ORDER — BETAMETHASONE SOD PHOS & ACET 6 (3-3) MG/ML IJ SUSP
3.0000 mg | Freq: Once | INTRAMUSCULAR | Status: AC
  Administered 2022-12-09: 3 mg via INTRA_ARTICULAR

## 2022-12-09 NOTE — Progress Notes (Signed)
Chief Complaint  Patient presents with   Foot Pain    "It's still hurting.  I don't know if it's gotten any better."    HPI: 67 y.o. male presenting today for follow-up evaluation of pain and tenderness associated to the lateral aspect of the left foot.  Patient states that the injection and Medrol Dosepak only helped temporarily.  He says the pain is the same as it was about a month ago.  Patient had the same condition about a year and a half ago to the right foot.  Injections were administered and he felt significant improvement in the pain eventually resolved.    Past Medical History:  Diagnosis Date   Cancer ([place]) 11/2004   Melanoma, R Knee by Dr Nehemiah Massed   Diverticulitis 04/27/2022   Diverticulosis    DIVERTICULOSIS ISCHEMIC COLITIS VIA COLONOSCOPY(DR. SEIGAL):(01/2001)   H. pylori infection    Hx of dysplastic nevus 06/27/2010   L dorsum mid lat. foot   Melanoma ([place])    R knee     Past Surgical History:  Procedure Laterality Date   APPENDECTOMY     As Teen   COLONOSCOPY WITH PROPOFOL N/A 05/29/2022   Procedure: COLONOSCOPY WITH BIOSPY;  Surgeon: Lin Landsman, MD;  Location: [facility];  Service: Endoscopy;  Laterality: N/A;   ESOPHAGOGASTRODUODENOSCOPY (EGD) WITH PROPOFOL N/A 04/10/2020   Procedure: ESOPHAGOGASTRODUODENOSCOPY (EGD) WITH PROPOFOL;  Surgeon: Lin Landsman, MD;  Location: [facility] ENDOSCOPY;  Service: Gastroenterology;  Laterality: N/A;   KNEE SURGERY     Open Repair Right as Teen   MELANOMA EXCISION Right 2006   Knee. Clark's level IV, Breslow's 0.84m   POLYPECTOMY N/A 05/29/2022   Procedure: POLYPECTOMY;  Surgeon: VLin Landsman MD;  [place]  Service: Endoscopy;  Laterality: N/A;    Allergies  Allergen Reactions   Diclofenac Other (See Comments)    Gastritis   Nsaids     H/o gastritis   Prilosec [Omeprazole] Other (See Comments)    Not an allergy, but "didn't feel well taking it"      Physical  Exam: General: The patient is alert and oriented x3 in no acute distress.  Dermatology: Skin is warm, dry and supple bilateral lower extremities. Negative for open lesions or macerations.  Vascular: Palpable pedal pulses bilaterally. Capillary refill within normal limits.  Negative for any significant edema or erythema  Neurological: Light touch and protective threshold grossly intact  Musculoskeletal Exam: No pedal deformities noted.  There continues to be pain on palpation at the insertion of the peroneal tendon onto the fifth metatarsal tubercle left foot consistent with an insertional peroneal tendinitis  Radiographic Exam LT foot 10/31/2022:  Normal osseous mineralization. Joint spaces preserved. No fracture/dislocation/boney destruction.    Assessment: 1.  Insertional peroneal tendinitis left   Plan of Care:  1. Patient evaluated. X-Rays reviewed.  2.  Repeat injection of 0.5 cc Celestone Soluspan injected around the fifth metatarsal tubercle left 3.  Patient developed GI upset with NSAIDs.  No NSAIDs prescribed.  Recommend extra strength Tylenol OTC as needed 5.  Immobilization cam boot dispensed.  WBAT.  Patient was hesitant about wearing it but states that he will wear it if the pain does not resolve 6.  Return to clinic 4 weeks       BEdrick Kins DPM [facility]  Dr. BEdrick Kins DPM    [address]  [place], [place] [address]                Office [phone]  Fax [phone]

## 2023-01-06 ENCOUNTER — Encounter: Payer: Self-pay | Admitting: Podiatry

## 2023-01-06 ENCOUNTER — Ambulatory Visit (INDEPENDENT_AMBULATORY_CARE_PROVIDER_SITE_OTHER): Payer: Medicare Other | Admitting: Podiatry

## 2023-01-06 VITALS — BP 120/70 | HR 59

## 2023-01-06 DIAGNOSIS — M7672 Peroneal tendinitis, left leg: Secondary | ICD-10-CM

## 2023-01-06 NOTE — Progress Notes (Signed)
Chief Complaint  Patient presents with   Foot Pain    "It's much, much better."    HPI: 67 y.o. male presenting today for follow-up evaluation of pain and tenderness associated to the lateral aspect of the left foot.  States that he no longer has any pain or tenderness.  He says the boot helped significantly alleviate his pain and tenderness to the lateral aspect of the foot.  He is currently back to the regular boots full activity with no pain or symptoms  Past Medical History:  Diagnosis Date   Cancer ([place]) 11/2004   Melanoma, R Knee by Dr Nehemiah Massed   Diverticulitis 04/27/2022   Diverticulosis    DIVERTICULOSIS ISCHEMIC COLITIS VIA COLONOSCOPY(DR. SEIGAL):(01/2001)   H. pylori infection    Hx of dysplastic nevus 06/27/2010   L dorsum mid lat. foot   Melanoma ([place])    R knee     Past Surgical History:  Procedure Laterality Date   APPENDECTOMY     As Teen   COLONOSCOPY WITH PROPOFOL N/A 05/29/2022   Procedure: COLONOSCOPY WITH BIOSPY;  Surgeon: Lin Landsman, MD;  Location: [facility];  Service: Endoscopy;  Laterality: N/A;   ESOPHAGOGASTRODUODENOSCOPY (EGD) WITH PROPOFOL N/A 04/10/2020   Procedure: ESOPHAGOGASTRODUODENOSCOPY (EGD) WITH PROPOFOL;  Surgeon: Lin Landsman, MD;  Location: [facility] ENDOSCOPY;  Service: Gastroenterology;  Laterality: N/A;   KNEE SURGERY     Open Repair Right as Teen   MELANOMA EXCISION Right 2006   Knee. Clark's level IV, Breslow's 0.15m   POLYPECTOMY N/A 05/29/2022   Procedure: POLYPECTOMY;  Surgeon: VLin Landsman MD;  [place]  Service: Endoscopy;  Laterality: N/A;    Allergies  Allergen Reactions   Diclofenac Other (See Comments)    Gastritis   Nsaids     H/o gastritis   Prilosec [Omeprazole] Other (See Comments)    Not an allergy, but "didn't feel well taking it"      Physical Exam: General: The patient is alert and oriented x3 in no acute distress.  Dermatology: Skin is warm, dry and supple  bilateral lower extremities. Negative for open lesions or macerations.  Vascular: Palpable pedal pulses bilaterally. Capillary refill within normal limits.  Negative for any significant edema or erythema  Neurological: Light touch and protective threshold grossly intact  Musculoskeletal Exam: No pedal deformities noted.  Negative for any appreciable pain on palpation at the insertion of the peroneal tendon onto the fifth metatarsal tubercle left foot   Radiographic Exam LT foot 10/31/2022:  Normal osseous mineralization. Joint spaces preserved. No fracture/dislocation/boney destruction.    Assessment: 1.  Insertional peroneal tendinitis left; resolved   Plan of Care:  1. Patient evaluated.  2.  Patient no longer has any pain or tenderness associated to the foot.  He says that the cam boot helped significantly 3.  Patient may resume regular boot 4.  Recommend OTC Tylenol as needed.  No NSAIDs. 5.  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       BEdrick Kins DPM [facility]  Dr. BEdrick Kins DPM    [address] [place] [address]  Office [phone]  Fax [phone]

## 2023-01-21 ENCOUNTER — Ambulatory Visit: Payer: PRIVATE HEALTH INSURANCE | Admitting: Dermatology

## 2023-01-28 ENCOUNTER — Encounter: Payer: Self-pay | Admitting: Dermatology

## 2023-01-28 ENCOUNTER — Ambulatory Visit (INDEPENDENT_AMBULATORY_CARE_PROVIDER_SITE_OTHER): Payer: Medicare Other | Admitting: Dermatology

## 2023-01-28 VITALS — BP 126/78

## 2023-01-28 DIAGNOSIS — L578 Other skin changes due to chronic exposure to nonionizing radiation: Secondary | ICD-10-CM | POA: Diagnosis not present

## 2023-01-28 DIAGNOSIS — L814 Other melanin hyperpigmentation: Secondary | ICD-10-CM

## 2023-01-28 DIAGNOSIS — L821 Other seborrheic keratosis: Secondary | ICD-10-CM

## 2023-01-28 DIAGNOSIS — Z872 Personal history of diseases of the skin and subcutaneous tissue: Secondary | ICD-10-CM

## 2023-01-28 DIAGNOSIS — L820 Inflamed seborrheic keratosis: Secondary | ICD-10-CM

## 2023-01-28 DIAGNOSIS — Z1283 Encounter for screening for malignant neoplasm of skin: Secondary | ICD-10-CM

## 2023-01-28 DIAGNOSIS — D229 Melanocytic nevi, unspecified: Secondary | ICD-10-CM

## 2023-01-28 NOTE — Progress Notes (Signed)
   Follow-Up Visit   Subjective  Charles Collins is a 67 y.o. male who presents for the following: Ak follow up of forehead treated with 5FU/Calcipotriene cream. He also has a sspot on his back that he would like checked. The following portions of the chart were reviewed this encounter and updated as appropriate: medications, allergies, medical history The patient presents for Upper Body Skin Exam (UBSE) for skin cancer screening and mole check.  The patient has spots, moles and lesions to be evaluated, some may be new or changing and the patient has concerns that these could be cancer.  Review of Systems:  No other skin or systemic complaints except as noted in HPI or Assessment and Plan.  Objective  Well appearing patient in no apparent distress; mood and affect are within normal limits.  Upper body skin examination was performed including scalp, head, eyes, ears, nose, lips, neck, chest, axillae, abdomen, back. All findings within normal limits unless otherwise noted below.   Relevant exam findings are noted in the Assessment and Plan.   Assessment & Plan   HISTORY OF PRECANCEROUS ACTINIC KERATOSIS - site(s) of PreCancerous Actinic Keratosis clear today. - these may recur and new lesions may form requiring treatment to prevent transformation into skin cancer - observe for new or changing spots and contact [facility] for appointment if occur - photoprotection with sun protective clothing; sunglasses and broad spectrum sunscreen with SPF of at least 30 + and frequent self skin exams recommended - yearly exams by a dermatologist recommended for persons with history of PreCancerous Actinic Keratoses  INFLAMED SEBORRHEIC KERATOSIS Exam: Erythematous keratotic or waxy stuck-on papule or plaque.  Symptomatic, irritating, patient would like treated.  Benign-appearing.  Call clinic for new or changing lesions.   Prior to procedure, discussed risks of blister formation, small  wound, skin dyspigmentation, or rare scar following treatment. Recommend Vaseline ointment to treated areas while healing.  Destruction Procedure Note Destruction method: cryotherapy   Informed consent: discussed and consent obtained   Lesion destroyed using liquid nitrogen: Yes   Outcome: patient tolerated procedure well with no complications   Post-procedure details: wound care instructions given   Locations: right low back # of Lesions Treated: 1  LENTIGINES, SEBORRHEIC KERATOSES, HEMANGIOMAS - Benign normal skin lesions - Benign-appearing - Call for any changes  MELANOCYTIC NEVI - Tan-brown and/or pink-flesh-colored symmetric macules and papules - Benign appearing on exam today - Observation - Call clinic for new or changing moles - Recommend daily use of broad spectrum spf 30+ sunscreen to sun-exposed areas.   ACTINIC DAMAGE - Chronic condition, secondary to cumulative UV/sun exposure - diffuse scaly erythematous macules with underlying dyspigmentation - Recommend daily broad spectrum sunscreen SPF 30+ to sun-exposed areas, reapply every 2 hours as needed.  - Staying in the shade or wearing long sleeves, sun glasses (UVA+UVB protection) and wide brim hats (4-inch brim around the entire circumference of the hat) are also recommended for sun protection.  - Call for new or changing lesions.  SKIN CANCER SCREENING PERFORMED TODAY  Return in about 1 year (around 01/28/2024) for TBSE.  I, Ashok Cordia, CMA, am acting as scribe for Sarina Ser, MD .  Documentation: I have reviewed the above documentation for accuracy and completeness, and I agree with the above.  Sarina Ser, MD

## 2023-01-28 NOTE — Patient Instructions (Signed)
Cryotherapy Aftercare  Wash gently with soap and water everyday.   Apply Vaseline and Band-Aid daily until healed.     Due to recent changes in healthcare laws, you may see results of your pathology and/or laboratory studies on MyChart before the doctors have had a chance to review them. We understand that in some cases there may be results that are confusing or concerning to you. Please understand that not all results are received at the same time and often the doctors may need to interpret multiple results in order to provide you with the best plan of care or course of treatment. Therefore, we ask that you please give us 2 business days to thoroughly review all your results before contacting the office for clarification. Should we see a critical lab result, you will be contacted sooner.   If You Need Anything After Your Visit  If you have any questions or concerns for your doctor, please call our main line at [phone] and press option 4 to reach your doctor's medical assistant. If no one answers, please leave a voicemail as directed and we will return your call as soon as possible. Messages left after 4 pm will be answered the following business day.   You may also send us a message via MyChart. We typically respond to MyChart messages within 1-2 business days.  For prescription refills, please ask your pharmacy to contact our office. Our fax number is [phone].  If you have an urgent issue when the clinic is closed that cannot wait until the next business day, you can page your doctor at the number below.    Please note that while we do our best to be available for urgent issues outside of office hours, we are not available 24/7.   If you have an urgent issue and are unable to reach us, you may choose to seek medical care at your doctor's office, retail clinic, urgent care center, or emergency room.  If you have a medical emergency, please immediately call 911 or go to the  emergency department.  Pager Numbers  - Dr. Kowalski: [phone]  - Dr. Moye: [phone]  - Dr. Stewart: [phone]  In the event of inclement weather, please call our main line at [phone] for an update on the status of any delays or closures.  Dermatology Medication Tips: Please keep the boxes that topical medications come in in order to help keep track of the instructions about where and how to use these. Pharmacies typically print the medication instructions only on the boxes and not directly on the medication tubes.   If your medication is too expensive, please contact our office at [phone] option 4 or send us a message through MyChart.   We are unable to tell what your co-pay for medications will be in advance as this is different depending on your insurance coverage. However, we may be able to find a substitute medication at lower cost or fill out paperwork to get insurance to cover a needed medication.   If a prior authorization is required to get your medication covered by your insurance company, please allow us 1-2 business days to complete this process.  Drug prices often vary depending on where the prescription is filled and some pharmacies may offer cheaper prices.  The website www.goodrx.com contains coupons for medications through different pharmacies. The prices here do not account for what the cost may be with help from insurance (it may be cheaper with your insurance), but the website can   give you the price if you did not use any insurance.  - You can print the associated coupon and take it with your prescription to the pharmacy.  - You may also stop by our office during regular business hours and pick up a GoodRx coupon card.  - If you need your prescription sent electronically to a different pharmacy, notify our office through [facility] MyChart or by phone at [phone] option 4.     Si Usted Necesita Algo Despus de Su Visita  Tambin puede  enviarnos un mensaje a travs de MyChart. Por lo general respondemos a los mensajes de MyChart en el transcurso de 1 a 2 das hbiles.  Para renovar recetas, por favor pida a su farmacia que se ponga en contacto con nuestra oficina. Nuestro nmero de fax es el [phone].  Si tiene un asunto urgente cuando la clnica est cerrada y que no puede esperar hasta el siguiente da hbil, puede llamar/localizar a su doctor(a) al nmero que aparece a continuacin.   Por favor, tenga en cuenta que aunque hacemos todo lo posible para estar disponibles para asuntos urgentes fuera del horario de oficina, no estamos disponibles las 24 horas del da, los 7 das de la semana.   Si tiene un problema urgente y no puede comunicarse con nosotros, puede optar por buscar atencin mdica  en el consultorio de su doctor(a), en una clnica privada, en un centro de atencin urgente o en una sala de emergencias.  Si tiene una emergencia mdica, por favor llame inmediatamente al 911 o vaya a la sala de emergencias.  Nmeros de bper  - Dr. Kowalski: [phone]  - Dra. Moye: [phone]  - Dra. Stewart: [phone]  En caso de inclemencias del tiempo, por favor llame a nuestra lnea principal al [phone] para una actualizacin sobre el estado de cualquier retraso o cierre.  Consejos para la medicacin en dermatologa: Por favor, guarde las cajas en las que vienen los medicamentos de uso tpico para ayudarle a seguir las instrucciones sobre dnde y cmo usarlos. Las farmacias generalmente imprimen las instrucciones del medicamento slo en las cajas y no directamente en los tubos del medicamento.   Si su medicamento es muy caro, por favor, pngase en contacto con nuestra oficina llamando al [phone] y presione la opcin 4 o envenos un mensaje a travs de MyChart.   No podemos decirle cul ser su copago por los medicamentos por adelantado ya que esto es diferente dependiendo de la cobertura de su seguro.  Sin embargo, es posible que podamos encontrar un medicamento sustituto a menor costo o llenar un formulario para que el seguro cubra el medicamento que se considera necesario.   Si se requiere una autorizacin previa para que su compaa de seguros cubra su medicamento, por favor permtanos de 1 a 2 das hbiles para completar este proceso.  Los precios de los medicamentos varan con frecuencia dependiendo del lugar de dnde se surte la receta y alguna farmacias pueden ofrecer precios ms baratos.  El sitio web www.goodrx.com tiene cupones para medicamentos de diferentes farmacias. Los precios aqu no tienen en cuenta lo que podra costar con la ayuda del seguro (puede ser ms barato con su seguro), pero el sitio web puede darle el precio si no utiliz ningn seguro.  - Puede imprimir el cupn correspondiente y llevarlo con su receta a la farmacia.  - Tambin puede pasar por nuestra oficina durante el horario de atencin regular y recoger una tarjeta de cupones de GoodRx.  -   Si necesita que su receta se enve electrnicamente a una farmacia diferente, informe a nuestra oficina a travs de MyChart de [facility] o por telfono llamando al [phone] y presione la opcin 4.  

## 2023-04-21 ENCOUNTER — Other Ambulatory Visit: Payer: PRIVATE HEALTH INSURANCE

## 2023-04-28 ENCOUNTER — Encounter: Payer: PRIVATE HEALTH INSURANCE | Admitting: Family Medicine

## 2023-05-03 ENCOUNTER — Other Ambulatory Visit: Payer: Self-pay | Admitting: Family Medicine

## 2023-05-03 DIAGNOSIS — E785 Hyperlipidemia, unspecified: Secondary | ICD-10-CM

## 2023-05-08 ENCOUNTER — Other Ambulatory Visit (INDEPENDENT_AMBULATORY_CARE_PROVIDER_SITE_OTHER): Payer: Medicare Other

## 2023-05-08 DIAGNOSIS — E785 Hyperlipidemia, unspecified: Secondary | ICD-10-CM | POA: Diagnosis not present

## 2023-05-08 LAB — COMPREHENSIVE METABOLIC PANEL
ALT: 17 U/L (ref 0–53)
AST: 22 U/L (ref 0–37)
Albumin: 4.1 g/dL (ref 3.5–5.2)
Alkaline Phosphatase: 63 U/L (ref 39–117)
BUN: 11 mg/dL (ref 6–23)
CO2: 26 mEq/L (ref 19–32)
Calcium: 9.4 mg/dL (ref 8.4–10.5)
Chloride: 104 mEq/L (ref 96–112)
Creatinine, Ser: 0.95 mg/dL (ref 0.40–1.50)
GFR: 83.47 mL/min (ref >60.00)
Glucose, Bld: 92 mg/dL (ref 70–99)
Potassium: 4.4 mEq/L (ref 3.5–5.1)
Sodium: 137 mEq/L (ref 135–145)
Total Bilirubin: 0.5 mg/dL (ref 0.2–1.2)
Total Protein: 7 g/dL (ref 6.0–8.3)

## 2023-05-08 LAB — LDL CHOLESTEROL, DIRECT: Direct LDL: 128 mg/dL

## 2023-05-08 LAB — LIPID PANEL
Cholesterol: 224 mg/dL — ABNORMAL HIGH (ref 0–200)
HDL: 38.2 mg/dL — ABNORMAL LOW (ref >39.00)
NonHDL: 185.59
Total CHOL/HDL Ratio: 6
Triglycerides: 242 mg/dL — ABNORMAL HIGH (ref 0.0–149.0)
VLDL: 48.4 mg/dL — ABNORMAL HIGH (ref 0.0–40.0)

## 2023-05-14 ENCOUNTER — Ambulatory Visit: Payer: Medicare Other | Admitting: Family Medicine

## 2023-05-14 ENCOUNTER — Encounter: Payer: Self-pay | Admitting: Family Medicine

## 2023-05-14 VITALS — BP 120/62 | HR 78 | Temp 98.0°F | Ht 68.0 in | Wt 170.0 lb

## 2023-05-14 DIAGNOSIS — E785 Hyperlipidemia, unspecified: Secondary | ICD-10-CM

## 2023-05-14 DIAGNOSIS — Z0000 Encounter for general adult medical examination without abnormal findings: Secondary | ICD-10-CM | POA: Diagnosis not present

## 2023-05-14 DIAGNOSIS — Z7189 Other specified counseling: Secondary | ICD-10-CM

## 2023-05-14 NOTE — Progress Notes (Signed)
I have personally reviewed the Medicare Annual Wellness questionnaire and have noted 1. The patient's medical and social history 2. Their use of alcohol, tobacco or illicit drugs 3. Their current medications and supplements 4. The patient's functional ability including ADL's, fall risks, home safety risks and hearing or visual             impairment. 5. Diet and physical activities 6. Evidence for depression or mood disorders  The patients weight, height, BMI have been recorded in the chart and visual acuity is per eye clinic.  I have made referrals, counseling and provided education to the patient based review of the above and I have provided the pt with a written personalized care plan for preventive services.  Provider list updated- see scanned forms.  Routine anticipatory guidance given to patient.  See health maintenance. The possibility exists that previously documented standard health maintenance information may have been brought forward from a previous encounter into this note.  If needed, that same information has been updated to reflect the current situation based on today's encounter.    Flu due in the fall, d/w pt.   Shingles d/wpt PNA d/w pt Tetanus 2013, d/w pt.   COVID-vaccine d/w pt.  Colonoscopy 2023 Prostate cancer screening and PSA options (with potential risks and benefits of testing vs not testing) were discussed along with recent recs/guidelines.  He declined testing PSA at this point. Advance directive-wife designated if patient were incapacitated. Cognitive function addressed- see scanned forms- and if abnormal then additional documentation follows.   In addition to [facility], follow up visit for the below conditions:  He has ortho f/u pending for his hip. D/w pt.    5th grandchild recently born, congrats given to patient.   Discussed getting routine vaccination to protect both himself and his grandkids.  PMH and SH reviewed  Meds, vitals, and allergies  reviewed.   ROS: Per HPI.  Unless specifically indicated otherwise in HPI, the patient denies:  General: fever. Eyes: acute vision changes ENT: sore throat Cardiovascular: chest pain Respiratory: SOB GI: vomiting GU: dysuria Musculoskeletal: acute back pain Derm: acute rash Neuro: acute motor dysfunction Psych: worsening mood Endocrine: polydipsia Heme: bleeding Allergy: hayfever  GEN: nad, alert and oriented HEENT: mucous membranes moist NECK: supple w/o LA CV: rrr. PULM: ctab, no inc wob ABD: soft, +bs EXT: no edema SKIN: no acute rash  The 10-year ASCVD risk score (Arnett DK, et al., 2019) is: 15.3%   Values used to calculate the score:     Age: 33 years     Sex: Male     Is Non-Hispanic African American: No     Diabetic: No     Tobacco smoker: No     Systolic Blood Pressure: 120 mmHg     Is BP treated: No     HDL Cholesterol: 38.2 mg/dL     Total Cholesterol: 224 mg/dL  D/w pt about cardiac scoring.

## 2023-05-14 NOTE — Patient Instructions (Addendum)
Tdap at the pharmacy.   I would get a flu shot each fall.    Shingles and pneumonia later.   Take care.  Glad to see you.  Let me know if you don't get a call about the cardiac screen.

## 2023-05-17 NOTE — Assessment & Plan Note (Signed)
Discussed options, cardiac calcium scoring ordered.  Rationale discussed with patient.

## 2023-05-17 NOTE — Assessment & Plan Note (Signed)
Advance directive- wife designated if patient were incapacitated.  

## 2023-05-17 NOTE — Assessment & Plan Note (Signed)
Flu due in the fall, d/w pt.   Shingles d/wpt PNA d/w pt Tetanus 2013, d/w pt.   COVID-vaccine d/w pt.  Colonoscopy 2023 Prostate cancer screening and PSA options (with potential risks and benefits of testing vs not testing) were discussed along with recent recs/guidelines.  He declined testing PSA at this point. Advance directive-wife designated if patient were incapacitated. Cognitive function addressed- see scanned forms- and if abnormal then additional documentation follows.

## 2023-05-18 ENCOUNTER — Ambulatory Visit
Admission: RE | Admit: 2023-05-18 | Discharge: 2023-05-18 | Disposition: A | Discharge status: Home / Self Care | Payer: Medicare Other | Source: Ambulatory Visit | Attending: Family Medicine | Admitting: Family Medicine

## 2023-05-18 DIAGNOSIS — E785 Hyperlipidemia, unspecified: Secondary | ICD-10-CM | POA: Insufficient documentation

## 2023-05-20 DIAGNOSIS — M160 Bilateral primary osteoarthritis of hip: Secondary | ICD-10-CM | POA: Insufficient documentation

## 2023-06-03 ENCOUNTER — Ambulatory Visit (INDEPENDENT_AMBULATORY_CARE_PROVIDER_SITE_OTHER): Payer: Medicare Other | Admitting: Gastroenterology

## 2023-06-03 ENCOUNTER — Other Ambulatory Visit: Payer: Self-pay

## 2023-06-03 ENCOUNTER — Encounter: Payer: Self-pay | Admitting: Gastroenterology

## 2023-06-03 VITALS — BP 128/75 | HR 78 | Temp 98.4°F | Ht 68.0 in | Wt 165.1 lb

## 2023-06-03 DIAGNOSIS — R1319 Other dysphagia: Secondary | ICD-10-CM | POA: Diagnosis not present

## 2023-06-03 NOTE — Progress Notes (Signed)
Arlyss Repress, MD [address]  [address]  [place], [place] [address]  Main: [phone]  Fax: [phone]    Gastroenterology Consultation  Referring Provider:     Joaquim Nam, MD Primary Care Physician:  Joaquim Nam, MD Primary Gastroenterologist:  Dr. Arlyss Repress Reason for Consultation: Difficulty swallowing        HPI:   Charles Collins is a 67 y.o. male referred by Dr. Joaquim Nam, MD  for consultation & management of difficulty swallowing.  Patient reports that for approximately 3 years, he has been experiencing intermittent episodes of difficulty swallowing with hard meats, reports choking episodes leading to coughing spells and regurgitation of food.  He denies any weight loss.  He does have intermittent heartburn for which he takes Tums as needed only.  He reports having stomach upset to Prilosec.  He is not on any long-term PPI. He had EGD in 2021 which revealed normal GE junction and esophagus, H. pylori gastritis which was treated with triple therapy, confirmed eradication on a follow-up H. pylori breath test  NSAIDs: None  Antiplts/Anticoagulants/Anti thrombotics: None  GI Procedures: Colonoscopy in 2002 Upper endoscopy 04/10/2020 Normal duodenum Gastritis, biopsied Normal GE junction and esophagus DIAGNOSIS:  A. STOMACH, RANDOM; COLD BIOPSY:  - MODERATE CHRONIC ACTIVE HELICOBACTER-ASSOCIATED GASTRITIS.  - CHANGES CONSISTENT WITH PROTON PUMP INHIBITOR EFFECT.  - NEGATIVE FOR DYSPLASIA AND MALIGNANCY.    Colonoscopy 05/29/2022 Normal terminal ileum 5 mm polyp in the transverse colon, resected Multiple diverticula in the sigmoid and rectosigmoid colon DIAGNOSIS:  A. COLON POLYP, TRANSVERSE; COLD SNARE  - TUBULAR ADENOMA.  - NEGATIVE FOR HIGH-GRADE DYSPLASIA AND MALIGNANCY.   Past Medical History:  Diagnosis Date   Cancer (HCC) 11/2004   Melanoma, R Knee by Dr Gwen Pounds   Diverticulitis 04/27/2022   Diverticulosis     DIVERTICULOSIS ISCHEMIC COLITIS VIA COLONOSCOPY(DR. SEIGAL):(01/2001)   H. pylori infection    Hx of dysplastic nevus 06/27/2010   L dorsum mid lat. foot   Melanoma (HCC)    R knee     Past Surgical History:  Procedure Laterality Date   APPENDECTOMY     As Teen   COLONOSCOPY WITH PROPOFOL N/A 05/29/2022   Procedure: COLONOSCOPY WITH BIOSPY;  Surgeon: Toney Reil, MD;  Location: [facility];  Service: Endoscopy;  Laterality: N/A;   ESOPHAGOGASTRODUODENOSCOPY (EGD) WITH PROPOFOL N/A 04/10/2020   Procedure: ESOPHAGOGASTRODUODENOSCOPY (EGD) WITH PROPOFOL;  Surgeon: Toney Reil, MD;  Location: [facility];  Service: Gastroenterology;  Laterality: N/A;   KNEE SURGERY     Open Repair Right as Teen   MELANOMA EXCISION Right 2006   Knee. Clark's level IV, Breslow's 0.42mm   POLYPECTOMY N/A 05/29/2022   Procedure: POLYPECTOMY;  Surgeon: Toney Reil, MD;  Location: [facility];  Service: Endoscopy;  Laterality: N/A;     Current Outpatient Medications:    cetirizine (ZYRTEC) 10 MG tablet, Take 10 mg by mouth daily., Disp: , Rfl:    MISC NATURAL PRODUCTS EX, Apply topically., Disp: , Rfl:    Multiple Vitamins-Minerals (ZINC PO), Take by mouth daily., Disp: , Rfl:    Family History  Problem Relation Age of Onset   Stroke Mother 61       Cause of Death, Hemmorhage   Diabetes Father    Hypertension Father    Obesity Father    Kidney disease Father    Kidney cancer Father    Pancreatic cancer Father  presumed, no biopsy done   Arthritis Father    Hypertension Paternal Grandfather    Heart disease Neg Hx    Cancer Neg Hx    Depression Neg Hx    Drug abuse Neg Hx    Alcohol abuse Neg Hx    Colon cancer Neg Hx    Prostate cancer Neg Hx      Social History   Tobacco Use   Smoking status: Former    Current packs/day: 0.00    Average packs/day: (0.1 ttl pk-yrs)    Types: Cigarettes    Start date: 11/03/1966    Quit date: 11/03/1968     Years since quitting: 54.6   Smokeless tobacco: Former    Types: Chew   Tobacco comments:    quit over 30 years ago, smoked briefly as a teenager  Vaping Use   Vaping status: Never Used  Substance Use Topics   Alcohol use: No   Drug use: No    Allergies as of 06/03/2023 - Review Complete 06/03/2023  Allergen Reaction Noted   Diclofenac Other (See Comments) 05/08/2020   Nsaids  04/20/2020   Prilosec [omeprazole] Other (See Comments) 01/05/2013    Review of Systems:    All systems reviewed and negative except where noted in HPI.   Physical Exam:  BP 128/75 (BP Location: Left Arm, Patient Position: Sitting, Cuff Size: Normal)   Pulse 78   Temp 98.4 F (36.9 C) (Oral)   Ht 5\' 8"  (1.727 m)   Wt 165 lb 2 oz (74.9 kg)   BMI 25.11 kg/m  No LMP for male patient.  General:   Alert,  Well-developed, well-nourished, pleasant and cooperative in NAD Head:  Normocephalic and atraumatic. Eyes:  Sclera clear, no icterus.   Conjunctiva pink. Ears:  Normal auditory acuity. Nose:  No deformity, discharge, or lesions. Mouth:  No deformity or lesions,oropharynx pink & moist. Neck:  Supple; no masses or thyromegaly. Lungs:  Respirations even and unlabored.  Clear throughout to auscultation.   No wheezes, crackles, or rhonchi. No acute distress. Heart:  Regular rate and rhythm; no murmurs, clicks, rubs, or gallops. Abdomen:  Normal bowel sounds. Soft, non-tender and non-distended without masses, hepatosplenomegaly or hernias noted.  No guarding or rebound tenderness.   Rectal: Not performed Msk:  Symmetrical without gross deformities. Good, equal movement & strength bilaterally. Pulses:  Normal pulses noted. Extremities:  No clubbing or edema.  No cyanosis. Neurologic:  Alert and oriented x3;  grossly normal neurologically. Skin:  Intact without significant lesions or rashes. No jaundice. Psych:  Alert and cooperative. Normal mood and affect.  Imaging Studies: Reviewed  Assessment and  Plan:   PINCHES ZARRELLA is a 67 y.o. pleasant Caucasian male with no significant past medical history, history of severe sigmoid diverticulosis, H. pylori gastritis in 2021 s/p triple therapy, confirmed eradication is seen in consultation for intermittent dysphagia to solids only  Dysphagia to solids Recommend EGD for further evaluation Vonoprazan samples were provided   I have discussed alternative options, risks & benefits,  which include, but are not limited to, bleeding, infection, perforation,respiratory complication & drug reaction.  The patient agrees with this plan & written consent will be obtained.    Follow up based on EGD findings  Arlyss Repress, MD

## 2023-06-03 NOTE — Patient Instructions (Addendum)
Gave samples of Voquezna please let us know how it helps and we can call you in a prescription for you.

## 2023-06-04 ENCOUNTER — Ambulatory Visit: Payer: Medicare Other | Admitting: Registered Nurse

## 2023-06-04 ENCOUNTER — Encounter
Admission: RE | Disposition: A | Discharge status: Home / Self Care | Payer: Self-pay | Source: Home / Self Care | Attending: Gastroenterology

## 2023-06-04 ENCOUNTER — Encounter: Payer: Self-pay | Admitting: Gastroenterology

## 2023-06-04 ENCOUNTER — Ambulatory Visit
Admission: RE | Admit: 2023-06-04 | Discharge: 2023-06-04 | Disposition: A | Discharge status: Home / Self Care | Payer: Medicare Other | Attending: Gastroenterology | Admitting: Gastroenterology

## 2023-06-04 DIAGNOSIS — K254 Chronic or unspecified gastric ulcer with hemorrhage: Secondary | ICD-10-CM | POA: Diagnosis not present

## 2023-06-04 DIAGNOSIS — K259 Gastric ulcer, unspecified as acute or chronic, without hemorrhage or perforation: Secondary | ICD-10-CM | POA: Diagnosis not present

## 2023-06-04 DIAGNOSIS — Z87891 Personal history of nicotine dependence: Secondary | ICD-10-CM | POA: Insufficient documentation

## 2023-06-04 DIAGNOSIS — K222 Esophageal obstruction: Secondary | ICD-10-CM | POA: Diagnosis not present

## 2023-06-04 DIAGNOSIS — K449 Diaphragmatic hernia without obstruction or gangrene: Secondary | ICD-10-CM | POA: Insufficient documentation

## 2023-06-04 DIAGNOSIS — K2960 Other gastritis without bleeding: Secondary | ICD-10-CM | POA: Insufficient documentation

## 2023-06-04 DIAGNOSIS — R1319 Other dysphagia: Secondary | ICD-10-CM | POA: Diagnosis not present

## 2023-06-04 DIAGNOSIS — K3189 Other diseases of stomach and duodenum: Secondary | ICD-10-CM | POA: Diagnosis not present

## 2023-06-04 DIAGNOSIS — R1314 Dysphagia, pharyngoesophageal phase: Secondary | ICD-10-CM | POA: Diagnosis present

## 2023-06-04 HISTORY — PX: BIOPSY: SHX5522

## 2023-06-04 HISTORY — PX: ESOPHAGOGASTRODUODENOSCOPY (EGD) WITH PROPOFOL: SHX5813

## 2023-06-04 HISTORY — PX: BALLOON DILATION: SHX5330

## 2023-06-04 SURGERY — ESOPHAGOGASTRODUODENOSCOPY (EGD) WITH PROPOFOL
Anesthesia: General

## 2023-06-04 MED ORDER — LIDOCAINE HCL (PF) 2 % IJ SOLN
INTRAMUSCULAR | Status: AC
  Filled 2023-06-04: qty 5

## 2023-06-04 MED ORDER — PROPOFOL 10 MG/ML IV BOLUS
INTRAVENOUS | Status: AC
  Filled 2023-06-04: qty 20

## 2023-06-04 MED ORDER — LIDOCAINE HCL (CARDIAC) PF 100 MG/5ML IV SOSY
PREFILLED_SYRINGE | INTRAVENOUS | Status: DC | PRN
  Administered 2023-06-04: 100 mg via INTRAVENOUS

## 2023-06-04 MED ORDER — EPHEDRINE SULFATE (PRESSORS) 50 MG/ML IJ SOLN
INTRAMUSCULAR | Status: DC | PRN
  Administered 2023-06-04: 10 mg via INTRAVENOUS

## 2023-06-04 MED ORDER — SODIUM CHLORIDE 0.9 % IV SOLN
INTRAVENOUS | Status: DC
  Administered 2023-06-04: 1000 mL via INTRAVENOUS

## 2023-06-04 MED ORDER — EPHEDRINE 5 MG/ML INJ
INTRAVENOUS | Status: AC
  Filled 2023-06-04: qty 5

## 2023-06-04 MED ORDER — PROPOFOL 10 MG/ML IV BOLUS
INTRAVENOUS | Status: DC | PRN
Start: 2023-06-04 — End: 2023-06-04
  Administered 2023-06-04 (×2): 50 mg via INTRAVENOUS
  Administered 2023-06-04: 100 mg via INTRAVENOUS
  Administered 2023-06-04: 20 mg via INTRAVENOUS

## 2023-06-04 NOTE — H&P (Signed)
Arlyss Repress, MD [address]  [address]  [place], [place] [address]  Main: [phone]  Fax: [phone] Pager: [phone]  Primary Care Physician:  Joaquim Nam, MD Primary Gastroenterologist:  Dr. Arlyss Repress  Pre-Procedure History & Physical: HPI:  Charles Collins is a 67 y.o. male is here for an endoscopy.   Past Medical History:  Diagnosis Date   Cancer (HCC) 11/2004   Melanoma, R Knee by Dr Gwen Pounds   Diverticulitis 04/27/2022   Diverticulosis    DIVERTICULOSIS ISCHEMIC COLITIS VIA COLONOSCOPY(DR. SEIGAL):(01/2001)   H. pylori infection    Hx of dysplastic nevus 06/27/2010   L dorsum mid lat. foot   Melanoma (HCC)    R knee     Past Surgical History:  Procedure Laterality Date   APPENDECTOMY     As Teen   COLONOSCOPY WITH PROPOFOL N/A 05/29/2022   Procedure: COLONOSCOPY WITH BIOSPY;  Surgeon: Toney Reil, MD;  Location: [facility];  Service: Endoscopy;  Laterality: N/A;   ESOPHAGOGASTRODUODENOSCOPY (EGD) WITH PROPOFOL N/A 04/10/2020   Procedure: ESOPHAGOGASTRODUODENOSCOPY (EGD) WITH PROPOFOL;  Surgeon: Toney Reil, MD;  Location: [facility] ENDOSCOPY;  Service: Gastroenterology;  Laterality: N/A;   KNEE SURGERY     Open Repair Right as Teen   MELANOMA EXCISION Right 2006   Knee. Clark's level IV, Breslow's 0.28mm   POLYPECTOMY N/A 05/29/2022   Procedure: POLYPECTOMY;  Surgeon: Toney Reil, MD;  Location: [facility];  Service: Endoscopy;  Laterality: N/A;    Prior to Admission medications   Medication Sig Start Date End Date Taking? Authorizing Provider  cetirizine (ZYRTEC) 10 MG tablet Take 10 mg by mouth daily.    [provider]  MISC NATURAL PRODUCTS EX Apply topically.    [provider]  Multiple Vitamins-Minerals (ZINC PO) Take by mouth daily.    [provider]    Allergies as of 06/03/2023 - Review Complete 06/03/2023  Allergen Reaction Noted   Diclofenac Other (See  Comments) 05/08/2020   Nsaids  04/20/2020   Prilosec [omeprazole] Other (See Comments) 01/05/2013    Family History  Problem Relation Age of Onset   Stroke Mother 66       Cause of Death, Hemmorhage   Diabetes Father    Hypertension Father    Obesity Father    Kidney disease Father    Kidney cancer Father    Pancreatic cancer Father        presumed, no biopsy done   Arthritis Father    Hypertension Paternal Grandfather    Heart disease Neg Hx    Cancer Neg Hx    Depression Neg Hx    Drug abuse Neg Hx    Alcohol abuse Neg Hx    Colon cancer Neg Hx    Prostate cancer Neg Hx     Social History   Socioeconomic History   Marital status: Married    Spouse name: Not on file   Number of children: 2   Years of education: Not on file   Highest education level: Not on file  Occupational History   Occupation: Scientist, physiological  Tobacco Use   Smoking status: Former    Current packs/day: 0.00    Average packs/day: (0.1 ttl pk-yrs)    Types: Cigarettes    Start date: 11/03/1966    Quit date: 11/03/1968    Years since quitting: 54.6   Smokeless tobacco: Former    Types: Chew   Tobacco comments:  quit over 30 years ago, smoked briefly as a teenager  Vaping Use   Vaping status: Never Used  Substance and Sexual Activity   Alcohol use: No   Drug use: No   Sexual activity: Yes  Other Topics Concern   Not on file  Social History Narrative   Marital Status: Married 1978 LIVES WITH WIFE   Children: 2 DAUGHTERS BOTH OUT AT HOME   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    [employer] fan   5 grandkids   Social Determinants of Health   Financial Resource Strain: Not on file  Food Insecurity: Not on file  Transportation Needs: Not on file  Physical Activity: Not on file  Stress: Not on file  Social Connections: Not on file  Intimate Partner Violence: Not on file    Review of Systems: See HPI, otherwise  negative ROS  Physical Exam: BP 139/67   Pulse (!) 53   Temp (!) 96.6 F (35.9 C) (Temporal)   Resp 16   Ht 5\' 8"  (1.727 m)   Wt 73.5 kg   SpO2 100%   BMI 24.63 kg/m  General:   Alert,  pleasant and cooperative in NAD Head:  Normocephalic and atraumatic. Neck:  Supple; no masses or thyromegaly. Lungs:  Clear throughout to auscultation.    Heart:  Regular rate and rhythm. Abdomen:  Soft, nontender and nondistended. Normal bowel sounds, without guarding, and without rebound.   Neurologic:  Alert and  oriented x4;  grossly normal neurologically.  Impression/Plan: Charles Collins is here for an endoscopy to be performed for dysphagia to solids  Risks, benefits, limitations, and alternatives regarding  endoscopy have been reviewed with the patient.  Questions have been answered.  All parties agreeable.   Lannette Donath, MD  06/04/2023, 10:52 AM

## 2023-06-04 NOTE — Transfer of Care (Signed)
Immediate Anesthesia Transfer of Care Note  Patient: Charles Collins  Procedure(s) Performed: ESOPHAGOGASTRODUODENOSCOPY (EGD) WITH PROPOFOL BIOPSY BALLOON DILATION  Patient Location: PACU and Endoscopy Unit  Anesthesia Type:General  Level of Consciousness: drowsy and patient cooperative  Airway & Oxygen Therapy: Patient Spontanous Breathing and Patient connected to face mask oxygen  Post-op Assessment: Report given to RN and Patient moving all extremities X 4  Post vital signs: Reviewed and stable  Last Vitals:  Vitals Value Taken Time  BP 126/76 06/04/23 1124  Temp    Pulse 74 06/04/23 1124  Resp 23 06/04/23 1124  SpO2 98 % 06/04/23 1124  Vitals shown include unfiled device data.  Last Pain:  Vitals:   06/04/23 1035  TempSrc: Temporal  PainSc: 0-No pain         Complications: No notable events documented.

## 2023-06-04 NOTE — Anesthesia Procedure Notes (Signed)
Procedure Name: General with mask airway Date/Time: 06/04/2023 11:06 AM  Performed by: Lily Lovings, CRNAPre-anesthesia Checklist: Patient identified, Emergency Drugs available, Suction available, Patient being monitored and Timeout performed Patient Re-evaluated:Patient Re-evaluated prior to induction Oxygen Delivery Method: Simple face mask Preoxygenation: Pre-oxygenation with 100% oxygen Induction Type: IV induction

## 2023-06-04 NOTE — Op Note (Signed)
[facility] Gastroenterology Patient Name: Charles Collins Procedure Date: 06/04/2023 10:45 AM MRN: [id number] Account #: [id number] Date of Birth: 12/17/1955 Admit Type: Outpatient Age: 68 Room: [facility] ENDO ROOM 3 Gender: Male Note Status: Finalized Instrument Name: Upper Endoscope [id number] Procedure:             Upper GI endoscopy Indications:           Esophageal dysphagia Providers:             Toney Reil MD, MD Referring MD:          Dwana Curd. Para March, MD (Referring MD) Medicines:             See the Anesthesia note for documentation of the                         administered medications Complications:         No immediate complications. Estimated blood loss:                         Minimal. Procedure:             Pre-Anesthesia Assessment:                        - Prior to the procedure, a History and Physical was                         performed, and patient medications and allergies were                         reviewed. The patient is competent. The risks and                         benefits of the procedure and the sedation options and                         risks were discussed with the patient. All questions                         were answered and informed consent was obtained.                         Patient identification and proposed procedure were                         verified by the physician, the nurse, the                         anesthesiologist, the anesthetist and the technician                         in the pre-procedure area in the procedure room in the                         endoscopy suite. Mental Status Examination: alert and                         oriented. Airway Examination: normal oropharyngeal  airway and neck mobility. Respiratory Examination:                         clear to auscultation. CV Examination: normal.                         Prophylactic Antibiotics: The patient does not require                          prophylactic antibiotics. Prior Anticoagulants: The                         patient has taken no anticoagulant or antiplatelet                         agents. ASA Grade Assessment: II - A patient with mild                         systemic disease. After reviewing the risks and                         benefits, the patient was deemed in satisfactory                         condition to undergo the procedure. The anesthesia                         plan was to use general anesthesia. Immediately prior                         to administration of medications, the patient was                         re-assessed for adequacy to receive sedatives. The                         heart rate, respiratory rate, oxygen saturations,                         blood pressure, adequacy of pulmonary ventilation, and                         response to care were monitored throughout the                         procedure. The physical status of the patient was                         re-assessed after the procedure.                        After obtaining informed consent, the endoscope was                         passed under direct vision. Throughout the procedure,                         the patient's blood pressure, pulse, and oxygen  saturations were monitored continuously. The Endoscope                         was introduced through the mouth, and advanced to the                         second part of duodenum. The upper GI endoscopy was                         accomplished without difficulty. The patient tolerated                         the procedure well. Findings:      The duodenal bulb and second portion of the duodenum were normal.      Multiple dispersed small erosions with stigmata of recent bleeding were       found in the gastric antrum. Biopsies were taken with a cold forceps for       Helicobacter pylori testing.      The gastric body and incisura were  normal. Biopsies were taken with a       cold forceps for Helicobacter pylori testing.      The cardia and gastric fundus were normal on retroflexion.      A small hiatal hernia was present.      Esophagogastric landmarks were identified: the gastroesophageal junction       was found at 38 cm from the incisors.      A low-grade of narrowing Schatzki ring was found at the gastroesophageal       junction. A TTS dilator was passed through the scope. Dilation with a       12-13.5-15 mm balloon dilator was performed to 15 mm. The dilation site       was examined following endoscope reinsertion and showed mild mucosal       disruption and moderate improvement in luminal narrowing. Estimated       blood loss was minimal. Impression:            - Normal duodenal bulb and second portion of the                         duodenum.                        - Erosive gastropathy with stigmata of recent                         bleeding. Biopsied.                        - Normal gastric body and incisura. Biopsied.                        - Small hiatal hernia.                        - Esophagogastric landmarks identified.                        - Low-grade of narrowing Schatzki ring. Dilated. Recommendation:        - Discharge patient to home (with escort).                        -  Resume previous diet today.                        - Continue present medications.                        - Await pathology results.                        - Follow an antireflux regimen for the rest of the                         patient's life.                        - Use a proton pump inhibitor PO BID for 3 months. Procedure Code(s):     --- Professional ---                        [phone], Esophagogastroduodenoscopy, flexible,                         transoral; with transendoscopic balloon dilation of                         esophagus (less than 30 mm diameter)                        43239, 59, Esophagogastroduodenoscopy,  flexible,                         transoral; with biopsy, single or multiple Diagnosis Code(s):     --- Professional ---                        K92.2, Gastrointestinal hemorrhage, unspecified                        K44.9, Diaphragmatic hernia without obstruction or                         gangrene                        K22.2, Esophageal obstruction                        R13.14, Dysphagia, pharyngoesophageal phase CPT copyright 2022 American Medical Association. All rights reserved. The codes documented in this report are preliminary and upon coder review may  be revised to meet current compliance requirements. Dr. Libby Maw Toney Reil MD, MD 06/04/2023 11:20:48 AM This report has been signed electronically. Number of Addenda: 0 Note Initiated On: 06/04/2023 10:45 AM Estimated Blood Loss:  Estimated blood loss was minimal.      [facility]

## 2023-06-04 NOTE — Anesthesia Preprocedure Evaluation (Signed)
Anesthesia Evaluation  Patient identified by MRN, date of birth, ID band Patient awake    Reviewed: Allergy & Precautions, H&P , NPO status , Patient's Chart, lab work & pertinent test results, reviewed documented beta blocker date and time   Airway Mallampati: II   Neck ROM: full    Dental  (+) Poor Dentition   Pulmonary neg pulmonary ROS, former smoker   Pulmonary exam normal        Cardiovascular Exercise Tolerance: Good negative cardio ROS Normal cardiovascular exam Rhythm:regular Rate:Normal     Neuro/Psych negative neurological ROS  negative psych ROS   GI/Hepatic negative GI ROS, Neg liver ROS,,,  Endo/Other  negative endocrine ROS    Renal/GU negative Renal ROS  negative genitourinary   Musculoskeletal   Abdominal   Peds  Hematology negative hematology ROS (+)   Anesthesia Other Findings Past Medical History: 11/2004: Cancer (HCC)     Comment:  Melanoma, R Knee by Dr Gwen Pounds 04/27/2022: Diverticulitis No date: Diverticulosis     Comment:  DIVERTICULOSIS ISCHEMIC COLITIS VIA COLONOSCOPY(DR.               SEIGAL):(01/2001) No date: H. pylori infection 06/27/2010: Hx of dysplastic nevus     Comment:  L dorsum mid lat. foot No date: Melanoma (HCC)     Comment:  R knee  Past Surgical History: No date: APPENDECTOMY     Comment:  As Teen 05/29/2022: COLONOSCOPY WITH PROPOFOL; N/A     Comment:  Procedure: COLONOSCOPY WITH BIOSPY;  Surgeon: Toney Reil, MD;  Location: [facility];                Service: Endoscopy;  Laterality: N/A; 04/10/2020: ESOPHAGOGASTRODUODENOSCOPY (EGD) WITH PROPOFOL; N/A     Comment:  Procedure: ESOPHAGOGASTRODUODENOSCOPY (EGD) WITH               PROPOFOL;  Surgeon: Toney Reil, MD;  Location:               [facility] ENDOSCOPY;  Service: Gastroenterology;  Laterality:               N/A; No date: KNEE SURGERY     Comment:  Open Repair Right as  Teen 2006: MELANOMA EXCISION; Right     Comment:  Knee. Clark's level IV, Breslow's 0.84mm 05/29/2022: POLYPECTOMY; N/A     Comment:  Procedure: POLYPECTOMY;  Surgeon: Toney Reil,               MD;  Location: [facility];  Service: Endoscopy;               Laterality: N/A; BMI    Body Mass Index: 24.63 kg/m     Reproductive/Obstetrics negative OB ROS                             Anesthesia Physical Anesthesia Plan  ASA: 2  Anesthesia Plan: General   Post-op Pain Management:    Induction:   PONV Risk Score and Plan:   Airway Management Planned:   Additional Equipment:   Intra-op Plan:   Post-operative Plan:   Informed Consent: I have reviewed the patients History and Physical, chart, labs and discussed the procedure including the risks, benefits and alternatives for the proposed anesthesia with the patient or authorized representative who has indicated his/her understanding and acceptance.  Dental Advisory Given  Plan Discussed with: CRNA  Anesthesia Plan Comments:        Anesthesia Quick Evaluation

## 2023-06-05 ENCOUNTER — Encounter: Payer: Self-pay | Admitting: Gastroenterology

## 2023-06-08 NOTE — Anesthesia Postprocedure Evaluation (Signed)
Anesthesia Post Note  Patient: Dezman Schoening Oleary  Procedure(s) Performed: ESOPHAGOGASTRODUODENOSCOPY (EGD) WITH PROPOFOL BIOPSY BALLOON DILATION  Patient location during evaluation: PACU Anesthesia Type: General Level of consciousness: awake and alert Pain management: pain level controlled Vital Signs Assessment: post-procedure vital signs reviewed and stable Respiratory status: spontaneous breathing, nonlabored ventilation, respiratory function stable and patient connected to nasal cannula oxygen Cardiovascular status: blood pressure returned to baseline and stable Postop Assessment: no apparent nausea or vomiting Anesthetic complications: no   No notable events documented.   Last Vitals:  Vitals:   06/04/23 1134 06/04/23 1144  BP: 120/72 (!) 116/58  Pulse: 72 62  Resp: 16 13  Temp:    SpO2: 100% 100%    Last Pain:  Vitals:   06/04/23 1144  TempSrc:   PainSc: 0-No pain                 Yevette Edwards

## 2023-06-10 ENCOUNTER — Telehealth: Payer: Self-pay

## 2023-06-10 MED ORDER — VONOPRAZAN FUMARATE 20 MG PO TABS: 1.0000 | ORAL_TABLET | Freq: Every day | ORAL | 1 refills | Status: DC

## 2023-06-10 NOTE — Addendum Note (Signed)
Addended by: Radene Knee L on: 06/10/2023 01:47 PM   Modules accepted: Orders

## 2023-06-10 NOTE — Telephone Encounter (Signed)
PT returned call requesting a call back.

## 2023-06-10 NOTE — Telephone Encounter (Signed)
-----   Message from [facility] sent at 06/09/2023  4:44 PM EDT ----- Pathology results from upper endoscopy came back normal.  We gave him samples of Vonoprazan to control acid reflux.  Please take with him if it is helping, if yes, please send in a prescription  Please ask him if difficulty swallowing is better since stretching his esophagus  RV

## 2023-06-10 NOTE — Telephone Encounter (Signed)
Patient verbalized understanding of results. Informed him the medication is helping his acid reflex. Explained to patient about the speciality pharmacy but he wants to try to get it at the local pharmacy first. [facility]

## 2023-06-10 NOTE — Telephone Encounter (Signed)
Called and left a message for call back  

## 2023-06-11 ENCOUNTER — Telehealth: Payer: Self-pay

## 2023-06-11 NOTE — Telephone Encounter (Signed)
Submitted PA through cover my meds for Vonoprazan. Waiting on response from insurance company

## 2023-06-15 NOTE — Telephone Encounter (Signed)
Let's try pantoprazole 40 mg p.o. twice daily for 30 days before meals  RV

## 2023-06-15 NOTE — Telephone Encounter (Signed)
Insurance company denied the Voquezna 20mg   they are wanting him to be on Esomeprazole Famotidine  Lansoprazole, Pantoprazole.He has already tried omeprazole in the past and did not tolerate it by the office visit not.

## 2023-06-16 MED ORDER — PANTOPRAZOLE SODIUM 40 MG PO TBEC: 40.0000 mg | DELAYED_RELEASE_TABLET | Freq: Two times a day (BID) | ORAL | 0 refills | Status: DC

## 2023-06-16 NOTE — Telephone Encounter (Signed)
Called patient and patient verbalized understanding sent medication to the pharmacy

## 2023-06-16 NOTE — Addendum Note (Signed)
Addended by: Radene Knee L on: 06/16/2023 08:21 AM   Modules accepted: Orders

## 2023-06-18 ENCOUNTER — Other Ambulatory Visit: Payer: Self-pay | Admitting: Podiatry

## 2023-06-18 DIAGNOSIS — R52 Pain, unspecified: Secondary | ICD-10-CM

## 2023-06-18 DIAGNOSIS — M7672 Peroneal tendinitis, left leg: Secondary | ICD-10-CM

## 2023-06-18 DIAGNOSIS — M79672 Pain in left foot: Secondary | ICD-10-CM

## 2023-11-22 ENCOUNTER — Ambulatory Visit
Admission: EM | Admit: 2023-11-22 | Discharge: 2023-11-22 | Disposition: A | Discharge status: Home / Self Care | Payer: Medicare Other | Attending: Emergency Medicine | Admitting: Emergency Medicine

## 2023-11-22 ENCOUNTER — Encounter: Payer: Self-pay | Admitting: Emergency Medicine

## 2023-11-22 DIAGNOSIS — J209 Acute bronchitis, unspecified: Secondary | ICD-10-CM

## 2023-11-22 LAB — POC COVID19/FLU A&B COMBO
Covid Antigen, POC: NEGATIVE
Influenza A Antigen, POC: NEGATIVE
Influenza B Antigen, POC: NEGATIVE

## 2023-11-22 MED ORDER — BENZONATATE 100 MG PO CAPS: 100.0000 mg | ORAL_CAPSULE | Freq: Three times a day (TID) | ORAL | 0 refills | Status: DC

## 2023-11-22 MED ORDER — PREDNISONE 10 MG (21) PO TBPK: ORAL_TABLET | Freq: Every day | ORAL | 0 refills | Status: DC

## 2023-11-22 MED ORDER — PROMETHAZINE-DM 6.25-15 MG/5ML PO SYRP: 2.5000 mL | ORAL_SOLUTION | Freq: Every evening | ORAL | 0 refills | Status: DC | PRN

## 2023-11-22 MED ORDER — AZITHROMYCIN 250 MG PO TABS: 250.0000 mg | ORAL_TABLET | Freq: Every day | ORAL | 0 refills | Status: DC

## 2023-11-22 NOTE — ED Provider Notes (Signed)
Renaldo Fiddler    CSN: [id number] Arrival date & time: 11/22/23  0806      History   Chief Complaint Chief Complaint  Patient presents with   Cough   Generalized Body Aches   Nasal Congestion    HPI Charles Collins is a 68 y.o. male.   Patient presents for evaluation of fever peaking at 99.8, nasal congestion, rhinorrhea, sore throat, bilateral ear fullness, sinus pressure along the cheeks causing the teeth to hurt, productive cough with green mucus, shortness of breath with exertion and wheezing primarily when lying present for 3 days.  Associated diarrhea.  Able to tolerate food and liquids.  Has attempted use of ice Gilliam and nasal spray.  History of bronchitis, former smoker.  Past Medical History:  Diagnosis Date   Cancer (HCC) 11/2004   Melanoma, R Knee by Dr Gwen Pounds   Diverticulitis 04/27/2022   Diverticulosis    DIVERTICULOSIS ISCHEMIC COLITIS VIA COLONOSCOPY(DR. SEIGAL):(01/2001)   H. pylori infection    Hx of dysplastic nevus 06/27/2010   L dorsum mid lat. 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   HLD (hyperlipidemia) 04/09/2020   Advance care planning 03/27/2015   Plantar fasciitis 03/12/2011   MELANOMA, HX OF 12/17/2007    Past Surgical History:  Procedure Laterality Date   APPENDECTOMY     As Teen   BALLOON DILATION  06/04/2023   Procedure: BALLOON DILATION;  Surgeon: Toney Reil, MD;  Location: [facility] ENDOSCOPY;  Service: Gastroenterology;;   BIOPSY  06/04/2023   Procedure: BIOPSY;  Surgeon: Toney Reil, MD;  Location: [facility] ENDOSCOPY;  Service: Gastroenterology;;   COLONOSCOPY WITH PROPOFOL N/A 05/29/2022    Procedure: COLONOSCOPY WITH BIOSPY;  Surgeon: Toney Reil, MD;  Location: [facility];  Service: Endoscopy;  Laterality: N/A;   ESOPHAGOGASTRODUODENOSCOPY (EGD) WITH PROPOFOL N/A 04/10/2020   Procedure: ESOPHAGOGASTRODUODENOSCOPY (EGD) WITH PROPOFOL;  Surgeon: Toney Reil, MD;  Location: [facility] ENDOSCOPY;  Service: Gastroenterology;  Laterality: N/A;   ESOPHAGOGASTRODUODENOSCOPY (EGD) WITH PROPOFOL N/A 06/04/2023   Procedure: ESOPHAGOGASTRODUODENOSCOPY (EGD) WITH PROPOFOL;  Surgeon: Toney Reil, MD;  Location: [facility] ENDOSCOPY;  Service: Gastroenterology;  Laterality: N/A;   KNEE SURGERY     Open Repair Right as Teen   MELANOMA EXCISION Right 2006   Knee. Clark's level IV, Breslow's 0.43mm   POLYPECTOMY N/A 05/29/2022   Procedure: POLYPECTOMY;  Surgeon: Toney Reil, MD;  Location: [facility];  Service: Endoscopy;  Laterality: N/A;       Home Medications    Prior to Admission medications   Medication Sig Start Date End Date Taking? Authorizing Provider  azithromycin (ZITHROMAX) 250 MG tablet Take 1 tablet (250 mg total) by mouth daily. Take first 2 tablets together, then 1 every day until finished. 11/28/23  Yes Madie Cahn R, NP  benzonatate (TESSALON) 100 MG capsule Take 1 capsule (100 mg total) by mouth every 8 (eight) hours. 11/22/23  Yes Karell Tukes R, NP  predniSONE (STERAPRED UNI-PAK 21 TAB) 10 MG (21) TBPK tablet Take by mouth daily. Take 6 tabs by mouth daily  for 1 days, then 5 tabs for 1  days, then 4 tabs for 1 days, then 3 tabs for 1 days, 2 tabs for 1 days, then 1 tab by mouth daily for 1 days 11/22/23  Yes Nickalas Mccarrick R, NP  promethazine-dextromethorphan (PROMETHAZINE-DM) 6.25-15 MG/5ML syrup Take 2.5 mLs by mouth at bedtime as needed. 11/22/23  Yes Heylee Tant R, NP  cetirizine (ZYRTEC) 10 MG tablet Take 10 mg by mouth daily.    [provider]  MISC NATURAL PRODUCTS EX Apply topically.    [provider]   Multiple Vitamins-Minerals (ZINC PO) Take by mouth daily.    [provider]  pantoprazole (PROTONIX) 40 MG tablet Take 1 tablet (40 mg total) by mouth 2 (two) times daily before a meal. 06/16/23   Vanga, Loel Dubonnet, MD    Family History Family History  Problem Relation Age of Onset   Stroke Mother 71       Cause of Death, Hemmorhage   Diabetes Father    Hypertension Father    Obesity Father    Kidney disease Father    Kidney cancer Father    Pancreatic cancer Father        presumed, no biopsy done   Arthritis Father    Hypertension Paternal Grandfather    Heart disease Neg Hx    Cancer Neg Hx    Depression Neg Hx    Drug abuse Neg Hx    Alcohol abuse Neg Hx    Colon cancer Neg Hx    Prostate cancer Neg Hx     Social History Social History   Tobacco Use   Smoking status: Former    Current packs/day: 0.00    Average packs/day: (0.1 ttl pk-yrs)    Types: Cigarettes    Start date: 11/03/1966    Quit date: 11/03/1968    Years since quitting: 55.0   Smokeless tobacco: Former    Types: Chew   Tobacco comments:    quit over 30 years ago, smoked briefly as a teenager  Vaping Use   Vaping status: Never Used  Substance Use Topics   Alcohol use: No   Drug use: No     Allergies   Diclofenac and Nsaids   Review of Systems Review of Systems   Physical Exam Triage Vital Signs ED Triage Vitals  Encounter Vitals Group     BP 11/22/23 0828 125/77     Systolic BP Percentile --      Diastolic BP Percentile --      Pulse Rate 11/22/23 0828 79     Resp 11/22/23 0828 18     Temp 11/22/23 0828 99.6 F (37.6 C)     Temp Source 11/22/23 0828 Oral     SpO2 11/22/23 0828 98 %     Weight --      Height --      Head Circumference --      Peak Flow --      Pain Score 11/22/23 0827 3     Pain Loc --      Pain Education --      Exclude from Growth Chart --    No data found.  Updated Vital Signs BP 125/77 (BP Location: Left Arm)   Pulse 79   Temp 99.6 F  (37.6 C) (Oral)   Resp 18   SpO2 98%   Visual Acuity Right Eye Distance:   Left Eye Distance:   Bilateral Distance:    Right Eye Near:   Left Eye Near:    Bilateral  Near:     Physical Exam Constitutional:      Appearance: Normal appearance.  HENT:     Head: Normocephalic.     Right Ear: Tympanic membrane, ear canal and external ear normal.     Left Ear: Tympanic membrane, ear canal and external ear normal.     Nose: Congestion present. No rhinorrhea.     Mouth/Throat:     Mouth: Mucous membranes are moist.     Pharynx: Oropharynx is clear. No oropharyngeal exudate or posterior oropharyngeal erythema.  Eyes:     Extraocular Movements: Extraocular movements intact.  Cardiovascular:     Rate and Rhythm: Normal rate and regular rhythm.     Pulses: Normal pulses.     Heart sounds: Normal heart sounds.  Pulmonary:     Effort: Pulmonary effort is normal.     Breath sounds: Normal breath sounds.  Musculoskeletal:     Cervical back: Normal range of motion and neck supple.  Neurological:     Mental Status: He is alert and oriented to person, place, and time. Mental status is at baseline.      UC Treatments / Results  Labs (all labs ordered are listed, but only abnormal results are displayed) Labs Reviewed  POC COVID19/FLU A&B COMBO - Normal    EKG   Radiology No results found.  Procedures Procedures (including critical care time)  Medications Ordered in UC Medications - No data to display  Initial Impression / Assessment and Plan / UC Course  I have reviewed the triage vital signs and the nursing notes.  Pertinent labs & imaging results that were available during my care of the patient were reviewed by me and considered in my medical decision making (see chart for details).  Acute bronchitis  Patient is in no signs of distress nor toxic appearing.  Vital signs are stable.  Low suspicion for bronchitis and therefore will defer imaging.  COVID and flu test  negative.  Prescribed prednisone, Tessalon and Promethazine DM, watch wait antibiotic placed at pharmacy if no improvements seen, azithromycin prescribed.May use additional over-the-counter medications as needed for supportive care.  May follow-up with urgent care as needed if symptoms persist or worsen.   Final Clinical Impressions(s) / UC Diagnoses   Final diagnoses:  Acute bronchitis, unspecified organism     Discharge Instructions      Your symptoms today are most likely being caused by a virus and should steadily improve in time it can take up to 7 to 10 days before you truly start to see a turnaround however things will get better, if no improvement in your symptoms by Saturday you may pick up azithromycin from the pharmacy for bacterial coverage  In the meantime begin prednisone every morning with food as directed to open and relax your airway, should help with shortness of breath and wheezing  You may use Tessalon pill taking 1 to 2 tablets every 8 hours for coughing, may use cough syrup at bedtime to help you sleep    You can take Tylenol and/or Ibuprofen as needed for fever reduction and pain relief.   For cough: honey 1/2 to 1 teaspoon (you can dilute the honey in water or another fluid).  You can also use guaifenesin and dextromethorphan for cough. You can use a humidifier for chest congestion and cough.  If you don't have a humidifier, you can sit in the bathroom with the hot shower running.      For sore throat: try warm salt  water gargles, cepacol lozenges, throat spray, warm tea or water with lemon/honey, popsicles or ice, or OTC cold relief medicine for throat discomfort.   For congestion: take a daily anti-histamine like Zyrtec, Claritin, and a oral decongestant, such as pseudoephedrine.  You can also use Flonase 1-2 sprays in each nostril daily.   It is important to stay hydrated: drink plenty of fluids (water, gatorade/powerade/pedialyte, juices, or teas) to keep your  throat moisturized and help further relieve irritation/discomfort.    ED Prescriptions     Medication Sig Dispense Auth. Provider   predniSONE (STERAPRED UNI-PAK 21 TAB) 10 MG (21) TBPK tablet Take by mouth daily. Take 6 tabs by mouth daily  for 1 days, then 5 tabs for 1 days, then 4 tabs for 1 days, then 3 tabs for 1 days, 2 tabs for 1 days, then 1 tab by mouth daily for 1 days 21 tablet Amiracle Neises R, NP   benzonatate (TESSALON) 100 MG capsule Take 1 capsule (100 mg total) by mouth every 8 (eight) hours. 21 capsule Temitope Griffing R, NP   promethazine-dextromethorphan (PROMETHAZINE-DM) 6.25-15 MG/5ML syrup Take 2.5 mLs by mouth at bedtime as needed. 118 mL Delailah Spieth R, NP   azithromycin (ZITHROMAX) 250 MG tablet Take 1 tablet (250 mg total) by mouth daily. Take first 2 tablets together, then 1 every day until finished. 6 tablet Valinda Hoar, NP      PDMP not reviewed this encounter.   Valinda Hoar, [place] 11/22/23 [phone]

## 2023-11-22 NOTE — Discharge Instructions (Signed)
Your symptoms today are most likely being caused by a virus and should steadily improve in time it can take up to 7 to 10 days before you truly start to see a turnaround however things will get better, if no improvement in your symptoms by Saturday you may pick up azithromycin from the pharmacy for bacterial coverage  In the meantime begin prednisone every morning with food as directed to open and relax your airway, should help with shortness of breath and wheezing  You may use Tessalon pill taking 1 to 2 tablets every 8 hours for coughing, may use cough syrup at bedtime to help you sleep    You can take Tylenol and/or Ibuprofen as needed for fever reduction and pain relief.   For cough: honey 1/2 to 1 teaspoon (you can dilute the honey in water or another fluid).  You can also use guaifenesin and dextromethorphan for cough. You can use a humidifier for chest congestion and cough.  If you don't have a humidifier, you can sit in the bathroom with the hot shower running.      For sore throat: try warm salt water gargles, cepacol lozenges, throat spray, warm tea or water with lemon/honey, popsicles or ice, or OTC cold relief medicine for throat discomfort.   For congestion: take a daily anti-histamine like Zyrtec, Claritin, and a oral decongestant, such as pseudoephedrine.  You can also use Flonase 1-2 sprays in each nostril daily.   It is important to stay hydrated: drink plenty of fluids (water, gatorade/powerade/pedialyte, juices, or teas) to keep your throat moisturized and help further relieve irritation/discomfort.

## 2023-11-22 NOTE — ED Triage Notes (Signed)
Pt c/o cough, SOB, chest congestion, fever and bodyaches x 3 days. Pt has tried OTC cold medication and nasal spray for his symptoms.

## 2023-11-23 ENCOUNTER — Ambulatory Visit: Payer: Self-pay

## 2023-11-28 ENCOUNTER — Telehealth: Payer: Self-pay | Admitting: Family Medicine

## 2023-11-28 DIAGNOSIS — R9389 Abnormal findings on diagnostic imaging of other specified body structures: Secondary | ICD-10-CM

## 2023-11-28 NOTE — Telephone Encounter (Signed)
Please call pt.  Due for f/u CT chest.  I put in the order.  Thanks.

## 2023-11-30 NOTE — Telephone Encounter (Signed)
Left message for patient to return call to advise that order has been placed for CT chest

## 2023-11-30 NOTE — Telephone Encounter (Signed)
Patient called back in and relayed message below to him. Thank you!

## 2023-12-10 ENCOUNTER — Ambulatory Visit
Admission: RE | Admit: 2023-12-10 | Discharge: 2023-12-10 | Disposition: A | Discharge status: Home / Self Care | Payer: Medicare Other | Source: Ambulatory Visit | Attending: Family Medicine | Admitting: Family Medicine

## 2023-12-10 DIAGNOSIS — R9389 Abnormal findings on diagnostic imaging of other specified body structures: Secondary | ICD-10-CM

## 2023-12-28 ENCOUNTER — Encounter: Payer: Self-pay | Admitting: Family Medicine

## 2023-12-28 ENCOUNTER — Other Ambulatory Visit: Payer: Self-pay | Admitting: Family Medicine

## 2023-12-28 DIAGNOSIS — R9389 Abnormal findings on diagnostic imaging of other specified body structures: Secondary | ICD-10-CM

## 2023-12-28 NOTE — Telephone Encounter (Signed)
 Spoke to pt regarding scheduling his cpe for this year. Pt asked if Dr. Para March was able to review his CT scan results? Pt states he hasn't received any calls regarding results. Please advise. Call back # [phone]

## 2023-12-30 ENCOUNTER — Other Ambulatory Visit: Payer: Self-pay | Admitting: Family Medicine

## 2023-12-30 ENCOUNTER — Ambulatory Visit: Payer: Medicare Other | Admitting: Pulmonary Disease

## 2023-12-30 ENCOUNTER — Encounter: Payer: Self-pay | Admitting: Family Medicine

## 2023-12-30 VITALS — BP 122/78 | HR 83 | Ht 68.0 in | Wt 169.0 lb

## 2023-12-30 DIAGNOSIS — J849 Interstitial pulmonary disease, unspecified: Secondary | ICD-10-CM | POA: Diagnosis not present

## 2023-12-30 DIAGNOSIS — I2510 Atherosclerotic heart disease of native coronary artery without angina pectoris: Secondary | ICD-10-CM | POA: Insufficient documentation

## 2023-12-30 DIAGNOSIS — R0602 Shortness of breath: Secondary | ICD-10-CM

## 2023-12-30 MED ORDER — ATORVASTATIN CALCIUM 20 MG PO TABS: 20.0000 mg | ORAL_TABLET | Freq: Every day | ORAL | 1 refills | Status: DC

## 2023-12-30 MED ORDER — ALBUTEROL SULFATE HFA 108 (90 BASE) MCG/ACT IN AERS: 2.0000 | INHALATION_SPRAY | Freq: Four times a day (QID) | RESPIRATORY_TRACT | 3 refills | Status: AC | PRN

## 2023-12-30 NOTE — Progress Notes (Signed)
 Synopsis: Referred in by Joaquim Nam, MD   Subjective:   PATIENT ID: Charles Collins GENDER: male DOB: 1956/10/31, MRN: [id number]  Chief Complaint  Patient presents with   Establish Care    Abnormal CT 12/10/23    HPI Mr. Gutridge is a pleasant 68 year old male patient with a past medical history of seasonal allergies on Zyrtec and hyperlipidemia on atorvastatin presenting today to the pulmonary clinic for an ongoing cough and shortness of breath on activity.  He reports that about 6 weeks ago he contracted a viral infection and started having ongoing cough with sputum production.  He did receive a round of prednisone azithromycin and improved however the cough has lingered.  It is mostly in the mornings and that usually productive of clear phlegm.  On review of system he reports bursitis in the left hip, some episode of food getting stuck in his chest, denies any Raynaud's phenomenon and no significant skin rashes.  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  Family history -he denies any family history of pulmonary diseases.  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  He does not have any pets at home.  He does have 2 grown daughters who are healthy.    ROS All systems were reviewed and are negative except for the above.  Objective:   Vitals:   12/30/23 1345  BP: 122/78  Pulse: 83  SpO2: 98%  Weight: 169 lb (76.7 kg)  Height: 5\' 8"  (1.727 m)   98% on RA BMI Readings from Last 3 Encounters:  12/30/23 25.70 kg/m  06/04/23 24.63 kg/m  06/03/23 25.11 kg/m   Wt Readings from Last 3 Encounters:  12/30/23 169 lb (76.7 kg)  06/04/23 162 lb (73.5 kg)  06/03/23 165 lb 2 oz (74.9 kg)    Physical Exam GEN: NAD, Healthy Appearing HEENT: Supple  Neck, Reactive Pupils, EOMI  CVS: Normal S1, Normal S2, RRR, No murmurs or ES appreciated  Lungs: Velcro-like crackles heard at the bases Abdomen: Soft, non tender, non distended, + BS  Extremities: Warm and well perfused, No edema  Skin: No suspicious lesions appreciated  Psych: Normal Affect  Ancillary Information   CBC    Component Value Date/Time   WBC 16.8 (H) 04/22/2022 1700   RBC 5.05 04/22/2022 1700   HGB 15.2 04/22/2022 1700   HCT 47.0 04/22/2022 1700   PLT 306 04/22/2022 1700   MCV 93.1 04/22/2022 1700   MCH 30.1 04/22/2022 1700   MCHC 32.3 04/22/2022 1700   RDW 12.8 04/22/2022 1700   LYMPHSABS 1.5 04/21/2022 1543   MONOABS 0.9 04/21/2022 1543   EOSABS 0.0 04/21/2022 1543   BASOSABS 0.0 04/21/2022 1543   Labs and imaging were reviewed.      No data to display           Assessment & Plan:  Mr. Beach is a pleasant 68 year old male patient with a past medical history of seasonal allergies on Zyrtec and hyperlipidemia on atorvastatin presenting today to the pulmonary clinic for an ongoing cough and shortness of breath on activity.  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  I will obtain pulmonary function test to evaluate lung mechanics and will also obtain an  autoimmune workup to assess for any autoimmune disease with lung involvement.  Should that return negative I will be inclined to start him on antifibrotic therapy versus proceeding with wedge biopsy.  His high-res CT scan did show enlarged mediastinal lymphadenopathy however this was at the time when he had upper respiratory infection and this could be reactive.  However I will repeat a CT scan without contrast in 6 months and should lymphadenopathy persist then we will proceed with an EBUS with TBNA.  []  PFTs []  ANA 12 profile, Myo marker panel, CBC with differential and  complete metabolic profile.  Return in about 8 weeks (around 02/24/2024).  I spent 60 minutes caring for this patient today, including preparing to see the patient, obtaining a medical history , reviewing a separately obtained history, performing a medically appropriate examination and/or evaluation, counseling and educating the patient/family/caregiver, ordering medications, tests, or procedures, documenting clinical information in the electronic health record, and independently interpreting results (not separately reported/billed) and communicating results to the patient/family/caregiver  Janann Colonel, MD [facility] 12/30/2023 3:08 PM

## 2023-12-31 ENCOUNTER — Other Ambulatory Visit: Payer: Self-pay | Admitting: Pulmonary Disease

## 2024-01-21 ENCOUNTER — Encounter: Payer: Self-pay | Admitting: Pulmonary Disease

## 2024-01-28 ENCOUNTER — Ambulatory Visit: Payer: PRIVATE HEALTH INSURANCE | Admitting: Dermatology

## 2024-01-28 LAB — MYOMARKER 3 PLUS PROFILE (RDL)
Anti-Jo-1 Ab (RDL): <20 U (ref <20)
Anti-MDA-5 Ab (CADM-140)(RDL): <20 U (ref <20)
Anti-PM/Scl-100 Ab (RDL): <20 U (ref <20)
Anti-SAE1 Ab, IgG (RDL): <20 U (ref <20)
Anti-SS-A 52kD Ab, IgG (RDL): <20 U (ref <20)
Anti-TIF-1gamma Ab (RDL): <20 U (ref <20)
Anti-U1 RNP Ab (RDL): <20 U (ref <20)

## 2024-01-28 LAB — CBC WITH DIFFERENTIAL/PLATELET
Basophils Absolute: 0 10*3/uL (ref 0.0–0.2)
Basos: 1 %
EOS (ABSOLUTE): 0.2 10*3/uL (ref 0.0–0.4)
Eos: 3 %
Hematocrit: 41.4 % (ref 37.5–51.0)
Hemoglobin: 13.9 g/dL (ref 13.0–17.7)
Immature Grans (Abs): 0 10*3/uL (ref 0.0–0.1)
Immature Granulocytes: 0 %
Lymphocytes Absolute: 1.2 10*3/uL (ref 0.7–3.1)
Lymphs: 16 %
MCH: 30.8 pg (ref 26.6–33.0)
MCHC: 33.6 g/dL (ref 31.5–35.7)
MCV: 92 fL (ref 79–97)
Monocytes Absolute: 0.6 10*3/uL (ref 0.1–0.9)
Monocytes: 9 %
Neutrophils Absolute: 5.2 10*3/uL (ref 1.4–7.0)
Neutrophils: 71 %
Platelets: 228 10*3/uL (ref 150–450)
RBC: 4.52 x10E6/uL (ref 4.14–5.80)
RDW: 12.9 % (ref 11.6–15.4)
WBC: 7.2 10*3/uL (ref 3.4–10.8)

## 2024-01-28 LAB — COMPREHENSIVE METABOLIC PANEL WITH GFR
ALT: 15 IU/L (ref 0–44)
AST: 22 IU/L (ref 0–40)
Albumin: 4 g/dL (ref 3.9–4.9)
Alkaline Phosphatase: 96 IU/L (ref 44–121)
BUN/Creatinine Ratio: 11 (ref 10–24)
BUN: 11 mg/dL (ref 8–27)
Bilirubin Total: 0.3 mg/dL (ref 0.0–1.2)
CO2: 22 mmol/L (ref 20–29)
Calcium: 9.3 mg/dL (ref 8.6–10.2)
Chloride: 105 mmol/L (ref 96–106)
Creatinine, Ser: 0.97 mg/dL (ref 0.76–1.27)
Globulin, Total: 2.4 g/dL (ref 1.5–4.5)
Glucose: 83 mg/dL (ref 70–99)
Potassium: 5 mmol/L (ref 3.5–5.2)
Sodium: 141 mmol/L (ref 134–144)
Total Protein: 6.4 g/dL (ref 6.0–8.5)
eGFR: 86 mL/min/{1.73_m2} (ref >59)

## 2024-01-28 LAB — ANTI-RO/NEG ANA: Anti-Ro (SS-A) Ab (RDL): <20 U (ref <20)

## 2024-01-28 LAB — ANA 12 PLUS PROFILE (RDL): Anti-Nuclear Ab by IFA (RDL): NEGATIVE

## 2024-02-01 ENCOUNTER — Encounter: Payer: Self-pay | Admitting: Family Medicine

## 2024-02-01 ENCOUNTER — Encounter: Payer: Self-pay | Admitting: Pulmonary Disease

## 2024-02-02 ENCOUNTER — Other Ambulatory Visit: Payer: Self-pay | Admitting: Family Medicine

## 2024-02-16 ENCOUNTER — Other Ambulatory Visit: Payer: Medicare Other

## 2024-02-23 ENCOUNTER — Ambulatory Visit: Payer: Medicare Other | Admitting: Family Medicine

## 2024-02-25 ENCOUNTER — Ambulatory Visit: Attending: Pulmonary Disease

## 2024-02-25 DIAGNOSIS — R0602 Shortness of breath: Secondary | ICD-10-CM

## 2024-02-25 DIAGNOSIS — R0609 Other forms of dyspnea: Secondary | ICD-10-CM | POA: Diagnosis not present

## 2024-02-25 DIAGNOSIS — J849 Interstitial pulmonary disease, unspecified: Secondary | ICD-10-CM | POA: Diagnosis present

## 2024-02-25 LAB — PULMONARY FUNCTION TEST ARMC ONLY
DL/VA % pred: 83 %
DL/VA: 3.44 ml/min/mmHg/L
DLCO unc % pred: 64 %
DLCO unc: 16.11 ml/min/mmHg
FEF 25-75 Post: 3.07 L/s
FEF 25-75 Pre: 2.43 L/s
FEF2575-%Change-Post: 26 %
FEF2575-%Pred-Post: 125 %
FEF2575-%Pred-Pre: 99 %
FEV1-%Change-Post: 6 %
FEV1-%Pred-Post: 93 %
FEV1-%Pred-Pre: 88 %
FEV1-Post: 2.92 L
FEV1-Pre: 2.75 L
FEV1FVC-%Change-Post: 3 %
FEV1FVC-%Pred-Pre: 105 %
FEV6-%Change-Post: 2 %
FEV6-%Pred-Post: 90 %
FEV6-%Pred-Pre: 88 %
FEV6-Post: 3.61 L
FEV6-Pre: 3.51 L
FEV6FVC-%Pred-Post: 105 %
FEV6FVC-%Pred-Pre: 105 %
FVC-%Change-Post: 2 %
FVC-%Pred-Post: 85 %
FVC-%Pred-Pre: 83 %
FVC-Post: 3.61 L
FVC-Pre: 3.51 L
Post FEV1/FVC ratio: 81 %
Post FEV6/FVC ratio: 100 %
Pre FEV1/FVC ratio: 78 %
Pre FEV6/FVC Ratio: 100 %
RV % pred: 52 %
RV: 1.2 L
TLC % pred: 78 %
TLC: 5.16 L

## 2024-02-25 MED ORDER — ALBUTEROL SULFATE (2.5 MG/3ML) 0.083% IN NEBU
2.5000 mg | INHALATION_SOLUTION | Freq: Once | RESPIRATORY_TRACT | Status: AC
  Administered 2024-02-25: 2.5 mg via RESPIRATORY_TRACT
  Filled 2024-02-25: qty 3

## 2024-02-29 ENCOUNTER — Encounter: Payer: Self-pay | Admitting: Pulmonary Disease

## 2024-02-29 ENCOUNTER — Telehealth: Payer: Self-pay

## 2024-02-29 ENCOUNTER — Ambulatory Visit (INDEPENDENT_AMBULATORY_CARE_PROVIDER_SITE_OTHER): Payer: Medicare Other | Admitting: Pulmonary Disease

## 2024-02-29 ENCOUNTER — Other Ambulatory Visit (HOSPITAL_COMMUNITY): Payer: Self-pay

## 2024-02-29 VITALS — BP 140/82 | HR 60 | Temp 96.9°F | Ht 68.0 in | Wt 170.6 lb

## 2024-02-29 DIAGNOSIS — D89839 Cytokine release syndrome, grade unspecified: Secondary | ICD-10-CM | POA: Diagnosis not present

## 2024-02-29 DIAGNOSIS — J4520 Mild intermittent asthma, uncomplicated: Secondary | ICD-10-CM

## 2024-02-29 DIAGNOSIS — J302 Other seasonal allergic rhinitis: Secondary | ICD-10-CM | POA: Diagnosis not present

## 2024-02-29 DIAGNOSIS — J849 Interstitial pulmonary disease, unspecified: Secondary | ICD-10-CM | POA: Diagnosis not present

## 2024-02-29 DIAGNOSIS — R590 Localized enlarged lymph nodes: Secondary | ICD-10-CM

## 2024-02-29 MED ORDER — FLUTICASONE FUROATE-VILANTEROL 100-25 MCG/ACT IN AEPB
1.0000 | INHALATION_SPRAY | Freq: Every day | RESPIRATORY_TRACT | 3 refills | Status: DC
Start: 2024-02-29 — End: 2024-03-21

## 2024-02-29 MED ORDER — FLUTICASONE-SALMETEROL 100-50 MCG/ACT IN AEPB: 1.0000 | INHALATION_SPRAY | Freq: Two times a day (BID) | RESPIRATORY_TRACT | 3 refills | Status: DC

## 2024-02-29 MED ORDER — FLUTICASONE PROPIONATE 50 MCG/ACT NA SUSP: 1.0000 | Freq: Every day | NASAL | 2 refills | Status: AC

## 2024-02-29 NOTE — Progress Notes (Addendum)
 Synopsis: Referred in by Donnie Galea, MD   Subjective:   PATIENT ID: Charles Collins GENDER: male DOB: 06-Mar-1956, MRN: [id number]  No chief complaint on file.   HPI Charles Collins is a pleasant 68 year old male patient with a past medical history of seasonal allergies on Zyrtec and hyperlipidemia on atorvastatin  presenting today to the pulmonary clinic for a follow up visit.   He initially saw me for a post viral acute bronchitis that was persistent. Which prompted a High res CT chest by his PCP.   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  On review of system he reports bursitis in the left hip, some episode of food getting stuck in his chest, denies any Raynaud's phenomenon and no significant skin rashes.  ANA negative  Myomarker 12 panel negative.   PFTs with mild restriction and mild reduction in DLCO.   He continues to have persistent cough and nasal congestion with post nasal drip.   Family history -he denies any family history of pulmonary diseases.  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  He does not have any pets at home.  He does have 2 grown daughters who are healthy.    ROS All systems were reviewed and are negative except for the above.  Objective:   There were no vitals filed for this visit.    on RA BMI Readings from Last 3 Encounters:  12/30/23 25.70 kg/m  06/04/23 24.63 kg/m  06/03/23 25.11 kg/m   Wt Readings from Last 3 Encounters:  12/30/23 169 lb (76.7 kg)  06/04/23 162 lb (73.5 kg)  06/03/23 165 lb 2 oz (74.9 kg)    Physical Exam GEN: NAD, Healthy Appearing HEENT: Supple Neck, Reactive Pupils, EOMI  CVS: Normal S1, Normal S2, RRR, No murmurs or ES appreciated  Lungs: Velcro-like crackles heard at the  bases Abdomen: Soft, non tender, non distended, + BS  Extremities: Warm and well perfused, No edema  Skin: No suspicious lesions appreciated  Psych: Normal Affect  Ancillary Information   CBC    Component Value Date/Time   WBC 7.2 12/31/2023 0734   WBC 16.8 (H) 04/22/2022 1700   RBC 4.52 12/31/2023 0734   RBC 5.05 04/22/2022 1700   HGB 13.9 12/31/2023 0734   HCT 41.4 12/31/2023 0734   PLT 228 12/31/2023 0734   MCV 92 12/31/2023 0734   MCH 30.8 12/31/2023 0734   MCH 30.1 04/22/2022 1700   MCHC 33.6 12/31/2023 0734   MCHC 32.3 04/22/2022 1700   RDW 12.9 12/31/2023 0734   LYMPHSABS 1.2 12/31/2023 0734   MONOABS 0.9 04/21/2022 1543   EOSABS 0.2 12/31/2023 0734   BASOSABS 0.0 12/31/2023 0734   Labs and imaging were reviewed.     Latest Ref Rng & Units 02/25/2024    8:15 AM  PFT Results  FVC-Pre L 3.51  P  FVC-Predicted Pre % 83  P  FVC-Post L 3.61  P  FVC-Predicted Post % 85  P  Pre FEV1/FVC % % 78  P  Post FEV1/FCV % % 81  P  FEV1-Pre L 2.75  P  FEV1-Predicted Pre % 88  P  FEV1-Post L 2.92  P  DLCO uncorrected ml/min/mmHg 16.11  P  DLCO UNC% % 64  P  DLVA Predicted % 83  P  TLC L 5.16  P  TLC % Predicted % 78  P  RV % Predicted % 52  P    P Preliminary result     Assessment & Plan:  Mr. Mole is a pleasant 68 year old male patient with a past medical history of seasonal allergies on Zyrtec and hyperlipidemia on atorvastatin  presenting today to the pulmonary clinic for an ongoing cough and shortness of breath on activity.  #Interstitial lung disease with a probable UIP pattern HRCT (12/2023)  #Mediastinal LAN  ANA negative  Myomarker panel negative.  PFT with mild restriction and mild to moderate reduction in DLCO.   Discussed starting antifibrotics however he is reluctant given his underlying diverticulosis/Diverticulitis. Will discuss with pharmacy regarding options favor Pirfenidone.   []  Repeat CT chest and PFTs in 6 months.  []  will plan on  starting pirfenidone once discussed with pharmacy.   #CRS and CVA  []  Will start Advair diskus 100 1 puff BID.  []  Flonase 1 spray each nostril daily  RTC 6 monhts with CT chest and PFTs .  I spent 40 minutes caring for this patient today, including preparing to see the patient, obtaining a medical history , reviewing a separately obtained history, performing a medically appropriate examination and/or evaluation, counseling and educating the patient/family/caregiver, ordering medications, tests, or procedures, documenting clinical information in the electronic health record, and independently interpreting results (not separately reported/billed) and communicating results to the patient/family/caregiver  Annitta Kindler, MD [facility] 02/29/2024 9:45 AM

## 2024-02-29 NOTE — Telephone Encounter (Signed)
*  Pulm  Pharmacy Patient Advocate Encounter   Received notification from CoverMyMeds that prior authorization for Advair Diskus 100-50MCG/ACT aerosol powder  is required/requested.   Insurance verification completed.   The patient is insured through [facility] .   Per test claim:  Brand Advair HFA or Brand Remi Carina is preferred by the insurance.  If suggested medication is appropriate, Please send in a new RX and discontinue this one. If not, please advise as to why it's not appropriate so that we may request a Prior Authorization. Please note, some preferred medications may still require a PA.  If the suggested medications have not been trialed and there are no contraindications to their use, the PA will not be submitted, as it will not be approved.   CMM Key: [id number]  *patient has a deductible to meet causing these higher prices  Advair HFA: $258.11 Breo Ellipta: $364.05

## 2024-02-29 NOTE — Addendum Note (Signed)
 Addended by: Annitta Kindler on: 02/29/2024 10:34 AM   Modules accepted: Level of Service

## 2024-03-04 ENCOUNTER — Encounter: Payer: Self-pay | Admitting: Pulmonary Disease

## 2024-03-04 LAB — ALLERGEN PANEL (27) + IGE
Alternaria Alternata IgE: <0.1 kU/L
Aspergillus Fumigatus IgE: <0.1 kU/L
Bahia Grass IgE: <0.1 kU/L
Bermuda Grass IgE: <0.1 kU/L
Cat Dander IgE: <0.1 kU/L
Cedar, Mountain IgE: <0.1 kU/L
Cladosporium Herbarum IgE: <0.1 kU/L
Cocklebur IgE: <0.1 kU/L
Cockroach, American IgE: <0.1 kU/L
Common Silver Birch IgE: <0.1 kU/L
D Farinae IgE: <0.1 kU/L
D Pteronyssinus IgE: <0.1 kU/L
Dog Dander IgE: <0.1 kU/L
Elm, American IgE: <0.1 kU/L
Hickory, White IgE: <0.1 kU/L
IgE (Immunoglobulin E), Serum: 7 [IU]/mL (ref 6–495)
Johnson Grass IgE: <0.1 kU/L
Kentucky Bluegrass IgE: <0.1 kU/L
Maple/Box Elder IgE: <0.1 kU/L
Mucor Racemosus IgE: <0.1 kU/L
Oak, White IgE: <0.1 kU/L
Penicillium Chrysogen IgE: <0.1 kU/L
Pigweed, Rough IgE: <0.1 kU/L
Plantain, English IgE: <0.1 kU/L
Ragweed, Short IgE: <0.1 kU/L
Setomelanomma Rostrat: <0.1 kU/L
Timothy Grass IgE: <0.1 kU/L
White Mulberry IgE: <0.1 kU/L

## 2024-03-04 LAB — RHEUMATOID FACTOR: Rheumatoid fact SerPl-aCnc: <10 [IU]/mL (ref <14.0)

## 2024-03-07 ENCOUNTER — Ambulatory Visit (INDEPENDENT_AMBULATORY_CARE_PROVIDER_SITE_OTHER): Payer: PRIVATE HEALTH INSURANCE | Admitting: Dermatology

## 2024-03-07 ENCOUNTER — Encounter: Payer: Self-pay | Admitting: Dermatology

## 2024-03-07 DIAGNOSIS — D1801 Hemangioma of skin and subcutaneous tissue: Secondary | ICD-10-CM

## 2024-03-07 DIAGNOSIS — L578 Other skin changes due to chronic exposure to nonionizing radiation: Secondary | ICD-10-CM

## 2024-03-07 DIAGNOSIS — W908XXA Exposure to other nonionizing radiation, initial encounter: Secondary | ICD-10-CM | POA: Diagnosis not present

## 2024-03-07 DIAGNOSIS — Z5111 Encounter for antineoplastic chemotherapy: Secondary | ICD-10-CM

## 2024-03-07 DIAGNOSIS — L821 Other seborrheic keratosis: Secondary | ICD-10-CM

## 2024-03-07 DIAGNOSIS — L570 Actinic keratosis: Secondary | ICD-10-CM | POA: Diagnosis not present

## 2024-03-07 DIAGNOSIS — L814 Other melanin hyperpigmentation: Secondary | ICD-10-CM

## 2024-03-07 DIAGNOSIS — Z79899 Other long term (current) drug therapy: Secondary | ICD-10-CM

## 2024-03-07 DIAGNOSIS — D692 Other nonthrombocytopenic purpura: Secondary | ICD-10-CM

## 2024-03-07 DIAGNOSIS — Z86018 Personal history of other benign neoplasm: Secondary | ICD-10-CM

## 2024-03-07 DIAGNOSIS — L820 Inflamed seborrheic keratosis: Secondary | ICD-10-CM | POA: Diagnosis not present

## 2024-03-07 DIAGNOSIS — Z1283 Encounter for screening for malignant neoplasm of skin: Secondary | ICD-10-CM

## 2024-03-07 DIAGNOSIS — D229 Melanocytic nevi, unspecified: Secondary | ICD-10-CM

## 2024-03-07 DIAGNOSIS — Z7189 Other specified counseling: Secondary | ICD-10-CM

## 2024-03-07 DIAGNOSIS — Z85820 Personal history of malignant melanoma of skin: Secondary | ICD-10-CM

## 2024-03-07 MED ORDER — FLUOROURACIL 5 % EX CREA
TOPICAL_CREAM | CUTANEOUS | 2 refills | Status: DC
Start: 2024-03-07 — End: 2024-05-17

## 2024-03-07 NOTE — Patient Instructions (Addendum)
Cryotherapy Aftercare  Wash gently with soap and water everyday.   Apply Vaseline and Band-Aid daily until healed.   Instructions for [facility] Medications  One or more of your medications was sent to the [facility] mail order compounding pharmacy. You will receive an email from them and can purchase the medicine through that link. It will then be mailed to your home at the address you confirmed. If for any reason you do not receive an email from them, please check your spam folder. If you still do not find the email, please let us know. [facility] phone number is [phone].     Due to recent changes in healthcare laws, you may see results of your pathology and/or laboratory studies on MyChart before the doctors have had a chance to review them. We understand that in some cases there may be results that are confusing or concerning to you. Please understand that not all results are received at the same time and often the doctors may need to interpret multiple results in order to provide you with the best plan of care or course of treatment. Therefore, we ask that you please give [place] 2 business days to thoroughly review all your results before contacting the office for clarification. Should we see a critical lab result, you will be contacted sooner.   If You Need Anything After Your Visit  If you have any questions or concerns for your doctor, please call our main line at [phone] and press option 4 to reach your doctor's medical assistant. If no one answers, please leave a voicemail as directed and we will return your call as soon as possible. Messages left after 4 pm will be answered the following business day.   You may also send [place] a message via MyChart. We typically respond to MyChart messages within 1-2 business days.  For prescription refills, please ask your pharmacy to contact our office. Our fax number is [phone].  If you have an urgent issue when the clinic  is closed that cannot wait until the next business day, you can page your doctor at the number below.    Please note that while we do our best to be available for urgent issues outside of office hours, we are not available 24/7.   If you have an urgent issue and are unable to reach [place], you may choose to seek medical care at your doctor's office, retail clinic, urgent care center, or emergency room.  If you have a medical emergency, please immediately call 911 or go to the emergency department.  Pager Numbers  - Dr. Gwen Pounds: [phone]  - Dr. Roseanne Reno: [phone]  - Dr. Katrinka Blazing: [phone]   In the event of inclement weather, please call our main line at [phone] for an update on the status of any delays or closures.  Dermatology Medication Tips: Please keep the boxes that topical medications come in in order to help keep track of the instructions about where and how to use these. Pharmacies typically print the medication instructions only on the boxes and not directly on the medication tubes.   If your medication is too expensive, please contact our office at [phone] option 4 or send [place] a message through MyChart.   We are unable to tell what your co-pay for medications will be in advance as this is different depending on your insurance coverage. However, we may be able to find a substitute medication at lower cost or fill out paperwork to get insurance to  cover a needed medication.   If a prior authorization is required to get your medication covered by your insurance company, please allow [place] 1-2 business days to complete this process.  Drug prices often vary depending on where the prescription is filled and some pharmacies may offer cheaper prices.  The website www.goodrx.com contains coupons for medications through different pharmacies. The prices here do not account for what the cost may be with help from insurance (it may be cheaper with your insurance), but the website  can give you the price if you did not use any insurance.  - You can print the associated coupon and take it with your prescription to the pharmacy.  - You may also stop by our office during regular business hours and pick up a GoodRx coupon card.  - If you need your prescription sent electronically to a different pharmacy, notify our office through [facility] or by phone at [phone] option 4.     Si Usted Necesita Algo Despus de Su Visita  Tambin puede enviarnos un mensaje a travs de Clinical cytogeneticist. Por lo general respondemos a los mensajes de MyChart en el transcurso de 1 a 2 das hbiles.  Para renovar recetas, por favor pida a su farmacia que se ponga en contacto con nuestra oficina. Annie Sable de fax es [place] [phone].  Si tiene un asunto urgente cuando la clnica est cerrada y que no puede esperar hasta el siguiente da hbil, puede llamar/localizar a su doctor(a) al nmero que aparece a continuacin.   Por favor, tenga en cuenta que aunque hacemos todo lo posible para estar disponibles para asuntos urgentes fuera del horario de [place], no estamos disponibles las 24 horas del da, los 7 [address]  [address] de la [place].   Si tiene un problema urgente y no puede comunicarse con nosotros, puede optar por buscar atencin mdica  en el consultorio de su doctor(a), en una clnica privada, en un centro de atencin urgente o en una sala de emergencias.  Si tiene Engineer, drilling, por favor llame inmediatamente al 911 o vaya a la sala de emergencias.  Nmeros de bper  - Dr. Gwen Pounds: [phone]  - Dra. Roseanne Reno: [phone]  - Dr. Katrinka Blazing: [phone]   En caso de inclemencias del tiempo, por favor llame a Lacy Duverney principal al [phone] para una actualizacin sobre el [place] de cualquier retraso o cierre.  Consejos para la medicacin en dermatologa: Por favor, guarde las cajas en las que vienen los medicamentos de uso tpico para ayudarle a seguir las instrucciones  sobre dnde y cmo usarlos. Las farmacias generalmente imprimen las instrucciones del medicamento slo en las cajas y no directamente en los tubos del [place].   Si su medicamento es muy caro, por favor, pngase en contacto con Rolm Gala llamando al [phone] y presione la opcin 4 o envenos un mensaje a travs de Clinical cytogeneticist.   No podemos decirle cul ser su copago por los medicamentos por adelantado ya que esto es diferente dependiendo de la cobertura de su seguro. Sin embargo, es posible que podamos encontrar un medicamento sustituto a Audiological scientist un formulario para que el seguro cubra el medicamento que se considera necesario.   Si se requiere una autorizacin previa para que su compaa de seguros [place] su medicamento, por favor permtanos de 1 a 2 das hbiles para completar [address]  Los precios de los medicamentos varan con frecuencia dependiendo del Environmental consultant de dnde se surte la receta y alguna farmacias pueden ofrecer precios  ms baratos.  El sitio web www.goodrx.com tiene cupones para medicamentos de Health and safety inspector. Los precios aqu no tienen en cuenta lo que podra costar con la ayuda del seguro (puede ser ms barato con su seguro), pero el sitio web puede darle el precio si no utiliz Tourist information centre manager.  - Puede imprimir el cupn correspondiente y llevarlo con su receta a la farmacia.  - Tambin puede pasar por nuestra oficina durante el horario de atencin regular y Education officer, museum una tarjeta de cupones de GoodRx.  - Si necesita que su receta se enve electrnicamente a una farmacia diferente, informe a nuestra oficina a travs de MyChart de [place] o por telfono llamando al [phone] y presione la opcin 4.

## 2024-03-07 NOTE — Progress Notes (Signed)
 Follow-Up Visit   Subjective  Charles Collins is a 68 y.o. male who presents for the following: Skin Cancer Screening and Full Body Skin Exam  The patient presents for Total-Body Skin Exam (TBSE) for skin cancer screening and mole check. The patient has spots, moles and lesions to be evaluated, some may be new or changing and the patient may have concern these could be cancer.  The following portions of the chart were reviewed this encounter and updated as appropriate: medications, allergies, medical history  Review of Systems:  No other skin or systemic complaints except as noted in HPI or Assessment and Plan.  Objective  Well appearing patient in no apparent distress; mood and affect are within normal limits.  A full examination was performed including scalp, head, eyes, ears, nose, lips, neck, chest, axillae, abdomen, back, buttocks, bilateral upper extremities, bilateral lower extremities, hands, feet, fingers, toes, fingernails, and toenails. All findings within normal limits unless otherwise noted below.   Relevant physical exam findings are noted in the Assessment and Plan.  back x 4, left leg x 1 (5) Stuck-on, waxy, tan-brown papules and plaques -- Discussed benign etiology and prognosis.  face, ears x 19 (19) Erythematous thin papules/macules with gritty scale.   Assessment & Plan   SKIN CANCER SCREENING PERFORMED TODAY.  LENTIGINES, SEBORRHEIC KERATOSES, HEMANGIOMAS - Benign normal skin lesions - Benign-appearing - Call for any changes  MELANOCYTIC NEVI - Tan-brown and/or pink-flesh-colored symmetric macules and papules - Benign appearing on exam today - Observation - Call clinic for new or changing moles - Recommend daily use of broad spectrum spf 30+ sunscreen to sun-exposed areas.   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  Call if any new or changing lesions are noted between office visits    HISTORY OF MELANOMA Right knee - No evidence of recurrence today - No lymphadenopathy - Recommend regular full body skin exams - Recommend daily broad spectrum sunscreen SPF 30+ to sun-exposed areas, reapply every 2 hours as needed.  - Call if any new or changing lesions are noted between office visits   INFLAMED SEBORRHEIC KERATOSIS (5) back x 4, left leg x 1 (5) Symptomatic, irritating, patient would like treated.  Destruction of lesion - back x 4, left leg x 1 (5) Complexity: simple   Destruction method: cryotherapy   Informed consent: discussed and consent obtained   Timeout:  patient name, date of birth, surgical site, and procedure verified Lesion destroyed using liquid nitrogen: Yes   Region frozen until ice ball extended beyond lesion: Yes   Outcome: patient tolerated procedure well with no complications   Post-procedure details: wound care instructions given   AK (ACTINIC KERATOSIS) (19) face, ears x 19 (19) ACTINIC DAMAGE WITH PRECANCEROUS ACTINIC KERATOSES Counseling for Topical Chemotherapy Management: Patient exhibits: - Severe, confluent actinic changes with pre-cancerous actinic keratoses that is secondary to cumulative UV radiation exposure over time - Condition that is severe; chronic, not at goal. - diffuse scaly erythematous macules and papules with underlying dyspigmentation - Discussed Prescription "Field Treatment" topical Chemotherapy for Severe, Chronic Confluent Actinic Changes with Pre-Cancerous Actinic Keratoses Field treatment involves treatment of an entire area of skin that has confluent Actinic Changes (Sun/ Ultraviolet light damage) and PreCancerous Actinic Keratoses by method of PhotoDynamic Therapy (PDT) and/or prescription Topical Chemotherapy agents  such as 5-fluorouracil , 5-fluorouracil /calcipotriene, and/or imiquimod.   The purpose is to decrease the number of clinically evident and subclinical PreCancerous lesions to prevent progression to development of skin cancer by chemically destroying early precancer changes that may or may not be visible.  It has been shown to reduce the risk of developing skin cancer in the treated area. As a result of treatment, redness, scaling, crusting, and open sores may occur during treatment course. One or more than one of these methods may be used and may have to be used several times to control, suppress and eliminate the PreCancerous changes. Discussed treatment course, expected reaction, and possible side effects. - Recommend daily broad spectrum sunscreen SPF 30+ to sun-exposed areas, reapply every 2 hours as needed.  - Staying in the shade or wearing long sleeves, sun glasses (UVA+UVB protection) and wide brim hats (4-inch brim around the entire circumference of the hat) are also recommended. - Call for new or changing lesions.   In mid June begin  5FU/Calcipotriene cream apply to forehead and temples twice a day x 7 days  Destruction of lesion - face, ears x 19 (19) Complexity: simple   Destruction method: cryotherapy   Informed consent: discussed and consent obtained   Timeout:  patient name, date of birth, surgical site, and procedure verified Lesion destroyed using liquid nitrogen: Yes   Region frozen until ice ball extended beyond lesion: Yes   Outcome: patient tolerated procedure well with no complications   Post-procedure details: wound care instructions given    Purpura - Chronic; persistent and recurrent.  Treatable, but not curable. - Violaceous macules and patches - Benign - Related to trauma, age, sun damage and/or use of blood thinners, chronic use of topical and/or oral steroids - Observe - Can use OTC arnica containing moisturizer such as Dermend Bruise Formula if desired - Call for worsening or other concerns    Return in about 1 year (around 03/07/2025)  for TBSE, hx of Melanoma .  IClara Crisp, CMA, am acting as scribe for Celine Collard, MD .   Documentation: I have reviewed the above documentation for accuracy and completeness, and I agree with the above.  Celine Collard, MD

## 2024-03-09 ENCOUNTER — Encounter: Payer: Self-pay | Admitting: Pulmonary Disease

## 2024-03-09 DIAGNOSIS — J45991 Cough variant asthma: Secondary | ICD-10-CM

## 2024-03-16 MED ORDER — BUDESONIDE-FORMOTEROL FUMARATE 160-4.5 MCG/ACT IN AERO
2.0000 | INHALATION_SPRAY | Freq: Two times a day (BID) | RESPIRATORY_TRACT | 12 refills | Status: DC
Start: 2024-03-16 — End: 2024-03-21

## 2024-03-17 ENCOUNTER — Telehealth: Payer: Self-pay

## 2024-03-17 DIAGNOSIS — J45991 Cough variant asthma: Secondary | ICD-10-CM

## 2024-03-17 NOTE — Telephone Encounter (Signed)
 Copied from CRM [id number]. Topic: Clinical - Prescription Issue >> Mar 17, 2024  8:51 AM Crist Dominion wrote: Reason for CRM: Patient called to confirm if Dr. Charm Coombs ordered him an alternative to the [facility] - Advised patient per the note from Dr. Lucina Sabal, a order for Symbicort has been sent to the patients pharmacy. >> Mar 17, 2024 10:43 AM Margarette Shawl wrote: Pt is contacting clinic concerning a alternative medication to the prescribed Symbicort. He was able to get information that insurance would cover the Advair HFA and he is requesting this be sent to [facility] on file if possible.   CB#  [phone]

## 2024-03-18 ENCOUNTER — Other Ambulatory Visit (HOSPITAL_COMMUNITY): Payer: Self-pay

## 2024-03-21 MED ORDER — FLUTICASONE-SALMETEROL 115-21 MCG/ACT IN AERO: 2.0000 | INHALATION_SPRAY | Freq: Two times a day (BID) | RESPIRATORY_TRACT | 12 refills | Status: AC

## 2024-03-21 NOTE — Telephone Encounter (Signed)
 Please advise if you would like to change prescription to Advair HFA.

## 2024-03-22 ENCOUNTER — Other Ambulatory Visit (HOSPITAL_COMMUNITY): Payer: Self-pay

## 2024-03-22 ENCOUNTER — Encounter (INDEPENDENT_AMBULATORY_CARE_PROVIDER_SITE_OTHER): Payer: Self-pay

## 2024-03-22 ENCOUNTER — Ambulatory Visit: Payer: Self-pay | Admitting: Pulmonary Disease

## 2024-03-22 ENCOUNTER — Telehealth: Payer: Self-pay

## 2024-03-22 NOTE — Telephone Encounter (Signed)
 See telephone encounter from 5/15.  Closing this encounter.

## 2024-03-22 NOTE — Telephone Encounter (Signed)
 Per secure char from Dr. 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  Forms have been printed and placed at the front desk for the patient to fill out.  The patient will stop by today to fill them out.

## 2024-03-22 NOTE — Telephone Encounter (Signed)
 Forms have been filled out and signed. He is also aware that he has not met his deductible so any inhaler we send in will be expensive. He will reach out to his insurance to set up a payment plan. He will let us  know if he needs anything else.  Second message has been sent to the pharmacy team. (See message from 5/20)  Nothing further needed.

## 2024-03-22 NOTE — Telephone Encounter (Signed)
 Patient was seen in the office today. Dr. Lucina Sabal has ordered Pirfenidone  for the patient.  He has signed the form and it has been faxed to the pharmacy team for completion.

## 2024-03-22 NOTE — Telephone Encounter (Signed)
 Pharmacy Patient Advocate Encounter   Received notification from CoverMyMeds that prior authorization for Fluticasone -Salmeterol 115-21MCG/ACT aerosol  is required/requested.   Insurance verification completed.   The patient is insured through [facility] .   Per test claim: Refill too soon. PA is not needed at this time. Medication was filled 05/19. Next eligible fill date is 06/10.

## 2024-03-23 ENCOUNTER — Telehealth: Payer: Self-pay

## 2024-03-23 DIAGNOSIS — J849 Interstitial pulmonary disease, unspecified: Secondary | ICD-10-CM

## 2024-03-23 DIAGNOSIS — Z5181 Encounter for therapeutic drug level monitoring: Secondary | ICD-10-CM

## 2024-03-23 NOTE — Telephone Encounter (Signed)
 Received new start paperwork via Onbase

## 2024-03-23 NOTE — Telephone Encounter (Signed)
 Received pirfenidone new start paperwork via Onbase. Sent to media tab  Submitted a Prior Authorization request to [facility] for PIRFENIDONE via CoverMyMeds. Will update once we receive a response.  Key: [id number]

## 2024-03-24 NOTE — Telephone Encounter (Signed)
 Noted. See telephone encounter from 03/23/24.  Nothing further needed.

## 2024-03-29 NOTE — Telephone Encounter (Signed)
 Copied from CRM [id number]. Topic: Clinical - Prescription Issue >> Mar 29, 2024 11:30 AM [name] wrote: Reason for CRM: Patient [phone] is checking on status of PIRFENIDONE medication financial help, patient has not heard from the office. Please advise and call back or via Mychart.

## 2024-03-30 ENCOUNTER — Other Ambulatory Visit (HOSPITAL_COMMUNITY): Payer: Self-pay

## 2024-03-30 NOTE — Telephone Encounter (Signed)
 Received notification from [facility] regarding a prior authorization for PIRFENIDONE. Authorization has been APPROVED from 03/23/2024 to 11/02/2098. Approval letter sent to scan center.  Per test claim, copay for 30 days supply is $345.53  Patient can fill through [facility]: [phone]   Authorization # [id number]  Contacted pt and discussed, he is comfortable moving forward with copay. Advised that the pharmacist is out of office this week, but would reach out to him to go over clinical information and send in medication to the pharmacy. Pt verbalized understanding.

## 2024-04-04 MED ORDER — PIRFENIDONE 267 MG PO TABS
801.0000 mg | ORAL_TABLET | Freq: Three times a day (TID) | ORAL | 3 refills | Status: DC
  Filled 2024-04-06 – 2024-05-17 (×2): qty 270, 30d supply, fill #0
  Filled 2024-06-17: qty 270, 30d supply, fill #1

## 2024-04-04 MED ORDER — PIRFENIDONE 267 MG PO TABS
ORAL_TABLET | ORAL | 0 refills | Status: DC
  Filled 2024-04-06: qty 207, 30d supply, fill #0

## 2024-04-04 NOTE — Telephone Encounter (Signed)
 Patient counseled on purpose, proper use, and potential adverse effects including nausea, vomiting, abdominal pain, GERD, weight loss, arthralgia, dizziness, and suns sensitivity/rash.  Stressed the importance of routine lab monitoring. Will monitor LFT's every month for the first 6 months of treatment then every 3 months. Future order placed for [place] office  Starting dose will be Esbriet 267 mg 1 tablet three times daily for 7 days, then 2 tablets three times daily for 7 days, then 3 tablets three times daily.  Maintenance dose will be 801 mg 1 tablet three times daily if tolerated.  Stressed the importance of taking with meals to minimize stomach upset.     Rx sent to [facility]. Patient advised that pharmacy will reach out to schedule first shipment to home  Jezabella Schriever, PharmD, MPH, BCPS, CPP Clinical Pharmacist (Rheumatology and Pulmonology)

## 2024-04-06 ENCOUNTER — Other Ambulatory Visit: Payer: Self-pay

## 2024-04-06 ENCOUNTER — Other Ambulatory Visit (HOSPITAL_COMMUNITY): Payer: Self-pay

## 2024-04-06 NOTE — Progress Notes (Signed)
 Patient starting pirfenidone for IPF. Counseled over phone on 04/04/2024 as below:  Patient counseled on purpose, proper use, and potential adverse effects including nausea, vomiting, abdominal pain, GERD, weight loss, arthralgia, dizziness, and suns sensitivity/rash.  Stressed the importance of routine lab monitoring. Will monitor LFT's every month for the first 6 months of treatment then every 3 months. Future order placed for [place] office   Starting dose will be Esbriet 267 mg 1 tablet three times daily for 7 days, then 2 tablets three times daily for 7 days, then 3 tablets three times daily.  Maintenance dose will be 801 mg 1 tablet three times daily if tolerated.  Stressed the importance of taking with meals to minimize stomach upset.

## 2024-04-06 NOTE — Progress Notes (Signed)
 Specialty Pharmacy Initial Fill Coordination Note  Charles Collins is a 68 y.o. male contacted today regarding initial fill of specialty medication(s) Pirfenidone   Patient requested Delivery   Delivery date: 04/12/24   Verified address: [address]  [place] [address]   Medication will be filled on 04/11/24.   Patient is aware of $304.49 copayment. Payment info collected and forwarded to Tamra at [facility].

## 2024-04-11 ENCOUNTER — Other Ambulatory Visit: Payer: Self-pay

## 2024-04-20 ENCOUNTER — Other Ambulatory Visit: Payer: Self-pay | Admitting: Family Medicine

## 2024-04-20 DIAGNOSIS — E785 Hyperlipidemia, unspecified: Secondary | ICD-10-CM

## 2024-04-21 ENCOUNTER — Other Ambulatory Visit (INDEPENDENT_AMBULATORY_CARE_PROVIDER_SITE_OTHER): Payer: Self-pay | Admitting: Vascular Surgery

## 2024-04-21 DIAGNOSIS — I728 Aneurysm of other specified arteries: Secondary | ICD-10-CM

## 2024-04-22 ENCOUNTER — Encounter (INDEPENDENT_AMBULATORY_CARE_PROVIDER_SITE_OTHER): Payer: Self-pay | Admitting: Nurse Practitioner

## 2024-04-22 ENCOUNTER — Ambulatory Visit (INDEPENDENT_AMBULATORY_CARE_PROVIDER_SITE_OTHER): Payer: Medicare Other | Admitting: Nurse Practitioner

## 2024-04-22 ENCOUNTER — Other Ambulatory Visit (INDEPENDENT_AMBULATORY_CARE_PROVIDER_SITE_OTHER): Payer: Medicare Other

## 2024-04-22 VITALS — BP 122/76 | HR 73 | Resp 18 | Ht 67.0 in | Wt 169.0 lb

## 2024-04-22 DIAGNOSIS — I728 Aneurysm of other specified arteries: Secondary | ICD-10-CM

## 2024-04-22 DIAGNOSIS — E785 Hyperlipidemia, unspecified: Secondary | ICD-10-CM

## 2024-04-25 ENCOUNTER — Encounter (INDEPENDENT_AMBULATORY_CARE_PROVIDER_SITE_OTHER): Payer: Self-pay | Admitting: Nurse Practitioner

## 2024-04-25 NOTE — Progress Notes (Signed)
 Subjective:    Patient ID: Charles Collins, male    DOB: 01/20/1956, 68 y.o.   MRN: [id number] Chief Complaint  Patient presents with   Follow-up    1 year follow up mesenteric    Patient returns today in follow up of his celiac artery aneurysm.  He has had some diverticulitis issues, but no symptoms worrisome for chronic mesenteric ischemia.  No postprandial abdominal pain, weight loss, or food fear.  Duplex today showed normal velocities in the celiac artery and superior mesenteric artery with a maximal diameter of the celiac artery measuring about 1.1 cm.  Previous study on 04/2022 measured 0.89, however the original CT scan alert was noted to measure approximately prior to last year also, were in the range of 1.1.    Review of Systems  All other systems reviewed and are negative.      Objective:   Physical Exam Vitals reviewed.  HENT:     Head: Normocephalic.   Cardiovascular:     Rate and Rhythm: Normal rate.     Pulses: Normal pulses.  Pulmonary:     Effort: Pulmonary effort is normal.   Skin:    General: Skin is warm and dry.   Neurological:     Mental Status: He is alert and oriented to person, place, and time.   Psychiatric:        Mood and Affect: Mood normal.        Behavior: Behavior normal.        Thought Content: Thought content normal.        Judgment: Judgment normal.     BP 122/76 (BP Location: Left Arm, Patient Position: Sitting, Cuff Size: Normal)   Pulse 73   Resp 18   Ht 5' 7 (1.702 m)   Wt 169 lb (76.7 kg)   BMI 26.47 kg/m   Past Medical History:  Diagnosis Date   Cancer (HCC) 11/2004   Melanoma, R Knee by Dr Hester   Diverticulitis 04/27/2022   Diverticulosis    DIVERTICULOSIS ISCHEMIC COLITIS VIA COLONOSCOPY(DR. SEIGAL):(01/2001)   H. pylori infection    Hx of dysplastic nevus 06/27/2010   L dorsum mid lat. foot   Melanoma (HCC)    R knee     Social History   Socioeconomic History   Marital status: Married    Spouse  name: Not on file   Number of children: 2   Years of education: Not on file   Highest education level: Not on file  Occupational History   Occupation: Scientist, physiological  Tobacco Use   Smoking status: Former    Current packs/day: 0.00    Average packs/day: (0.1 ttl pk-yrs)    Types: Cigarettes    Start date: 11/03/1966    Quit date: 11/03/1968    Years since quitting: 55.5   Smokeless tobacco: Former    Types: Chew   Tobacco comments:    quit over 30 years ago, smoked briefly as a teenager  Vaping Use   Vaping status: Never Used  Substance and Sexual Activity   Alcohol use: No   Drug use: No   Sexual activity: Yes  Other Topics Concern   Not on file  Social History Narrative   Marital Status: Married 1978 LIVES WITH WIFE   Children: 2 DAUGHTERS BOTH OUT AT HOME   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    [employer] fan   5 grandkids  Social Drivers of Corporate investment banker Strain: Not on file  Food Insecurity: Not on file  Transportation Needs: Not on file  Physical Activity: Not on file  Stress: Not on file  Social Connections: Not on file  Intimate Partner Violence: Not on file    Past Surgical History:  Procedure Laterality Date   APPENDECTOMY     As Teen   BALLOON DILATION  06/04/2023   Procedure: BALLOON DILATION;  Surgeon: Unk Corinn Skiff, MD;  Location: [facility] ENDOSCOPY;  Service: Gastroenterology;;   BIOPSY  06/04/2023   Procedure: BIOPSY;  Surgeon: Unk Corinn Skiff, MD;  Location: [facility] ENDOSCOPY;  Service: Gastroenterology;;   COLONOSCOPY WITH PROPOFOL  N/A 05/29/2022   Procedure: COLONOSCOPY WITH BIOSPY;  Surgeon: Unk Corinn Skiff, MD;  Location: [facility];  Service: Endoscopy;  Laterality: N/A;   ESOPHAGOGASTRODUODENOSCOPY (EGD) WITH PROPOFOL  N/A 04/10/2020   Procedure: ESOPHAGOGASTRODUODENOSCOPY (EGD) WITH PROPOFOL ;  Surgeon: Unk Corinn Skiff, MD;  Location: [facility]  ENDOSCOPY;  Service: Gastroenterology;  Laterality: N/A;   ESOPHAGOGASTRODUODENOSCOPY (EGD) WITH PROPOFOL  N/A 06/04/2023   Procedure: ESOPHAGOGASTRODUODENOSCOPY (EGD) WITH PROPOFOL ;  Surgeon: Unk Corinn Skiff, MD;  Location: [facility] ENDOSCOPY;  Service: Gastroenterology;  Laterality: N/A;   KNEE SURGERY     Open Repair Right as Teen   MELANOMA EXCISION Right 2006   Knee. Clark's level IV, Breslow's 0.31mm   POLYPECTOMY N/A 05/29/2022   Procedure: POLYPECTOMY;  Surgeon: Unk Corinn Skiff, MD;  Location: [facility];  Service: Endoscopy;  Laterality: N/A;    Family History  Problem Relation Age of Onset   Stroke Mother 39       Cause of Death, Hemmorhage   Diabetes Father    Hypertension Father    Obesity Father    Kidney disease Father    Kidney cancer Father    Pancreatic cancer Father        presumed, no biopsy done   Arthritis Father    Hypertension Paternal Grandfather    Heart disease Neg Hx    Cancer Neg Hx    Depression Neg Hx    Drug abuse Neg Hx    Alcohol abuse Neg Hx    Colon cancer Neg Hx    Prostate cancer Neg Hx     Allergies  Allergen Reactions   Atorvastatin      Muscle aches   Diclofenac  Other (See Comments)    Gastritis   Nsaids     H/o gastritis       Latest Ref Rng & Units 12/31/2023    7:34 AM 04/22/2022    5:00 PM 04/21/2022    3:43 PM  CBC  WBC 3.4 - 10.8 x10E3/uL 7.2  16.8  16.3   Hemoglobin 13.0 - 17.7 g/dL 86.0  84.7  85.2   Hematocrit 37.5 - 51.0 % 41.4  47.0  46.3   Platelets 150 - 450 x10E3/uL 228  306  311       CMP     Component Value Date/Time   NA 141 12/31/2023 0734   K 5.0 12/31/2023 0734   CL 105 12/31/2023 0734   CO2 22 12/31/2023 0734   GLUCOSE 83 12/31/2023 0734   GLUCOSE 92 05/08/2023 0738   BUN 11 12/31/2023 0734   CREATININE 0.97 12/31/2023 0734   CALCIUM  9.3 12/31/2023 0734   PROT 6.4 12/31/2023 0734   ALBUMIN 4.0 12/31/2023 0734   AST 22 12/31/2023 0734   ALT 15 12/31/2023 0734   ALKPHOS 96  12/31/2023 0734  BILITOT 0.3 12/31/2023 0734   GFR 83.47 05/08/2023 0738   EGFR 86 12/31/2023 0734   GFRNONAA >60 04/22/2022 1700     No results found.     Assessment & Plan:   1. Celiac artery aneurysm (HCC) (Primary) Duplex today showed normal velocities in the celiac artery and superior mesenteric artery with a maximal diameter of the celiac artery measuring about 1.1 cm which is consistent with the original CT where it was as well as with the previous ultrasounds prior to last study in 2023. we will continue to follow the patient annually  2. Hyperlipidemia, unspecified hyperlipidemia type Continue statin as ordered and reviewed, no changes at this time   Current Outpatient Medications on File Prior to Visit  Medication Sig Dispense Refill   albuterol  (VENTOLIN  HFA) 108 (90 Base) MCG/ACT inhaler Inhale 2 puffs into the lungs every 6 (six) hours as needed for wheezing or shortness of breath. 18 g 3   cetirizine (ZYRTEC) 10 MG tablet Take 10 mg by mouth daily.     fluorouracil  (EFUDEX ) 5 % cream Apply to forehead and temples twice a day x 7 days 30 g 2   fluticasone  (FLONASE ) 50 MCG/ACT nasal spray Place 1 spray into both nostrils daily. 100 mL 2   fluticasone -salmeterol (ADVAIR HFA) 115-21 MCG/ACT inhaler Inhale 2 puffs into the lungs 2 (two) times daily. 1 each 12   Multiple Vitamins-Minerals (ZINC PO) Take by mouth daily.     Pirfenidone  267 MG TABS Take 1 tab three times daily for 7 days, then 2 tabs three times daily for 7 days, then 3 tabs three times daily thereafter. REPEAT LABS IN ONE MONTH 207 tablet 0   Pirfenidone  267 MG TABS Take 3 tablets (801 mg total) by mouth 3 (three) times daily with meals. 270 tablet 3   No current facility-administered medications on file prior to visit.    There are no Patient Instructions on file for this visit. No follow-ups on file.   Shiri Hodapp E Dallon Dacosta, NP

## 2024-05-10 ENCOUNTER — Other Ambulatory Visit (INDEPENDENT_AMBULATORY_CARE_PROVIDER_SITE_OTHER): Payer: Medicare Other

## 2024-05-10 DIAGNOSIS — E785 Hyperlipidemia, unspecified: Secondary | ICD-10-CM

## 2024-05-10 LAB — LIPID PANEL
Cholesterol: 221 mg/dL — ABNORMAL HIGH (ref 0–200)
HDL: 43.4 mg/dL (ref >39.00)
LDL Cholesterol: 135 mg/dL — ABNORMAL HIGH (ref 0–99)
NonHDL: 178.04
Total CHOL/HDL Ratio: 5
Triglycerides: 213 mg/dL — ABNORMAL HIGH (ref 0.0–149.0)
VLDL: 42.6 mg/dL — ABNORMAL HIGH (ref 0.0–40.0)

## 2024-05-10 LAB — COMPREHENSIVE METABOLIC PANEL WITH GFR
ALT: 11 U/L (ref 0–53)
AST: 17 U/L (ref 0–37)
Albumin: 4.3 g/dL (ref 3.5–5.2)
Alkaline Phosphatase: 70 U/L (ref 39–117)
BUN: 11 mg/dL (ref 6–23)
CO2: 29 meq/L (ref 19–32)
Calcium: 9.6 mg/dL (ref 8.4–10.5)
Chloride: 103 meq/L (ref 96–112)
Creatinine, Ser: 1.01 mg/dL (ref 0.40–1.50)
GFR: 77.01 mL/min (ref >60.00)
Glucose, Bld: 96 mg/dL (ref 70–99)
Potassium: 4.9 meq/L (ref 3.5–5.1)
Sodium: 139 meq/L (ref 135–145)
Total Bilirubin: 0.5 mg/dL (ref 0.2–1.2)
Total Protein: 7 g/dL (ref 6.0–8.3)

## 2024-05-10 LAB — CBC WITH DIFFERENTIAL/PLATELET
Basophils Absolute: 0 K/uL (ref 0.0–0.1)
Basophils Relative: 0.6 % (ref 0.0–3.0)
Eosinophils Absolute: 0.3 K/uL (ref 0.0–0.7)
Eosinophils Relative: 4.4 % (ref 0.0–5.0)
HCT: 45.3 % (ref 39.0–52.0)
Hemoglobin: 15.1 g/dL (ref 13.0–17.0)
Lymphocytes Relative: 27.3 % (ref 12.0–46.0)
Lymphs Abs: 1.8 K/uL (ref 0.7–4.0)
MCHC: 33.3 g/dL (ref 30.0–36.0)
MCV: 90.3 fl (ref 78.0–100.0)
Monocytes Absolute: 0.5 K/uL (ref 0.1–1.0)
Monocytes Relative: 7.9 % (ref 3.0–12.0)
Neutro Abs: 4 K/uL (ref 1.4–7.7)
Neutrophils Relative %: 59.8 % (ref 43.0–77.0)
Platelets: 240 K/uL (ref 150.0–400.0)
RBC: 5.02 Mil/uL (ref 4.22–5.81)
RDW: 13.7 % (ref 11.5–15.5)
WBC: 6.7 K/uL (ref 4.0–10.5)

## 2024-05-14 ENCOUNTER — Ambulatory Visit: Payer: Self-pay | Admitting: Family Medicine

## 2024-05-16 ENCOUNTER — Ambulatory Visit

## 2024-05-17 ENCOUNTER — Ambulatory Visit (INDEPENDENT_AMBULATORY_CARE_PROVIDER_SITE_OTHER): Payer: Medicare Other | Admitting: Family Medicine

## 2024-05-17 ENCOUNTER — Other Ambulatory Visit: Payer: Self-pay

## 2024-05-17 ENCOUNTER — Encounter: Payer: Self-pay | Admitting: Family Medicine

## 2024-05-17 VITALS — BP 140/78 | HR 67 | Temp 98.3°F | Ht 68.19 in | Wt 169.6 lb

## 2024-05-17 DIAGNOSIS — Z789 Other specified health status: Secondary | ICD-10-CM | POA: Diagnosis not present

## 2024-05-17 DIAGNOSIS — J849 Interstitial pulmonary disease, unspecified: Secondary | ICD-10-CM | POA: Diagnosis not present

## 2024-05-17 DIAGNOSIS — Z7189 Other specified counseling: Secondary | ICD-10-CM

## 2024-05-17 DIAGNOSIS — I2510 Atherosclerotic heart disease of native coronary artery without angina pectoris: Secondary | ICD-10-CM

## 2024-05-17 MED ORDER — PRAVASTATIN SODIUM 10 MG PO TABS: 10.0000 mg | ORAL_TABLET | Freq: Every day | ORAL | 3 refills | Status: DC

## 2024-05-17 NOTE — Progress Notes (Unsigned)
 Flu due in the fall, d/w pt.   Shingles d/wpt PNA d/w pt Tetanus 2013, d/w pt.   COVID-vaccine d/w pt.  Colonoscopy 2023 Prostate cancer screening and PSA options (with potential risks and benefits of testing vs not testing) were discussed along with recent recs/guidelines.  He declined testing PSA at this point. Advance directive-wife designated if patient were incapacitated.  Still seeing dermatology yearly.    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  Some cough.  Some clear sputum.  Some nausea on pirdenidone.  No wheeze. No need for SABA.  D/w pt about rinsing after advair.    CAD.  D/w pt about statin use.  He had aches on atorvastatin  and stopped med.  D/w pt.  No CP.    Meds, vitals, and allergies reviewed.   ROS: Per HPI unless specifically indicated in ROS section   GEN: nad, alert and oriented HEENT: ncat NECK: supple w/o LA CV: rrr. PULM: ctab, no inc wob ABD: soft, +bs EXT: no edema SKIN: no acute rash  30 minutes were devoted to patient care in this encounter (this includes time spent reviewing the patient's file/history, interviewing and examining the patient, counseling/reviewing plan with patient).

## 2024-05-17 NOTE — Patient Instructions (Addendum)
 See if you can tolerate pravastatin  later this year.  You could try taking it MWF if not daily.  Take care.  Glad to see you.  I would get a flu shot each fall.   PNA vaccine when possible.    Tdap when possible.  It  may be cheaper at the pharmacy.  Ask pulmonary about RSV vaccine.

## 2024-05-18 DIAGNOSIS — Z0000 Encounter for general adult medical examination without abnormal findings: Secondary | ICD-10-CM | POA: Insufficient documentation

## 2024-05-18 NOTE — Assessment & Plan Note (Signed)
 Advance directive- wife designated if patient were incapacitated.

## 2024-05-18 NOTE — Assessment & Plan Note (Signed)
 D/w pt about statin use.  He had aches on atorvastatin  and stopped med.  D/w pt.  No CP.   Reasonable to try pravastatin  after he has been on pirfenidone  for longer, so he is only making 1 change at a time.  Statin cautions discussed with patient.

## 2024-05-18 NOTE — Assessment & Plan Note (Signed)
 Flu due in the fall, d/w pt.   Shingles d/wpt PNA d/w pt Tetanus 2013, d/w pt.   COVID-vaccine d/w pt.  Colonoscopy 2023 Prostate cancer screening and PSA options (with potential risks and benefits of testing vs not testing) were discussed along with recent recs/guidelines.  He declined testing PSA at this point. Advance directive-wife designated if patient were incapacitated.

## 2024-05-18 NOTE — Assessment & Plan Note (Signed)
 Lungs are clear.  Still on pirdenidone, with some nausea.  Discussed routine cautions with Advair.  He is going to have pulmonary follow-up.  Discussed not changing his medications at this point, see coronary artery disease discussion.

## 2024-05-19 ENCOUNTER — Other Ambulatory Visit: Payer: Self-pay

## 2024-05-19 NOTE — Progress Notes (Signed)
 Specialty Pharmacy Refill Coordination Note  Charles Collins is a 67 y.o. male contacted today regarding refills of specialty medication(s) Pirfenidone    Patient requested Delivery   Delivery date: 05/26/24   Verified address: [address]  [place] [address]   Medication will be filled on 05/25/24.

## 2024-05-25 ENCOUNTER — Other Ambulatory Visit: Payer: Self-pay

## 2024-06-08 ENCOUNTER — Telehealth: Payer: Self-pay | Admitting: Pulmonary Disease

## 2024-06-08 NOTE — Telephone Encounter (Signed)
 LVMTCB to schedule PFT and rov.

## 2024-06-13 ENCOUNTER — Ambulatory Visit
Admission: RE | Admit: 2024-06-13 | Discharge: 2024-06-13 | Disposition: A | Discharge status: Home / Self Care | Source: Ambulatory Visit | Attending: Pulmonary Disease | Admitting: Pulmonary Disease

## 2024-06-13 DIAGNOSIS — J849 Interstitial pulmonary disease, unspecified: Secondary | ICD-10-CM

## 2024-06-14 ENCOUNTER — Other Ambulatory Visit: Payer: Self-pay

## 2024-06-17 ENCOUNTER — Other Ambulatory Visit: Payer: Self-pay

## 2024-06-27 ENCOUNTER — Encounter: Payer: Self-pay | Admitting: Nurse Practitioner

## 2024-06-27 ENCOUNTER — Ambulatory Visit (INDEPENDENT_AMBULATORY_CARE_PROVIDER_SITE_OTHER): Admitting: Nurse Practitioner

## 2024-06-27 ENCOUNTER — Other Ambulatory Visit
Admission: RE | Admit: 2024-06-27 | Discharge: 2024-06-27 | Disposition: A | Discharge status: Home / Self Care | Source: Ambulatory Visit | Attending: Nurse Practitioner | Admitting: Nurse Practitioner

## 2024-06-27 ENCOUNTER — Ambulatory Visit: Admitting: Pulmonary Disease

## 2024-06-27 VITALS — BP 116/76 | HR 93 | Temp 98.3°F | Ht 68.0 in | Wt 166.2 lb

## 2024-06-27 DIAGNOSIS — J849 Interstitial pulmonary disease, unspecified: Secondary | ICD-10-CM

## 2024-06-27 DIAGNOSIS — Z5181 Encounter for therapeutic drug level monitoring: Secondary | ICD-10-CM | POA: Diagnosis not present

## 2024-06-27 DIAGNOSIS — J4520 Mild intermittent asthma, uncomplicated: Secondary | ICD-10-CM | POA: Diagnosis not present

## 2024-06-27 DIAGNOSIS — J4530 Mild persistent asthma, uncomplicated: Secondary | ICD-10-CM | POA: Diagnosis not present

## 2024-06-27 DIAGNOSIS — Z23 Encounter for immunization: Secondary | ICD-10-CM

## 2024-06-27 LAB — HEPATIC FUNCTION PANEL
ALT: 25 U/L (ref 0–44)
AST: 27 U/L (ref 15–41)
Albumin: 3.9 g/dL (ref 3.5–5.0)
Alkaline Phosphatase: 70 U/L (ref 38–126)
Bilirubin, Direct: <0.1 mg/dL (ref 0.0–0.2)
Total Bilirubin: 0.6 mg/dL (ref 0.0–1.2)
Total Protein: 7.2 g/dL (ref 6.5–8.1)

## 2024-06-27 LAB — PULMONARY FUNCTION TEST
DL/VA % pred: 99 %
DL/VA: 4.09 ml/min/mmHg/L
DLCO unc % pred: 82 %
DLCO unc: 20.41 ml/min/mmHg
FEF 25-75 Post: 2.64 L/s
FEF 25-75 Pre: 2.78 L/s
FEF2575-%Change-Post: -5 %
FEF2575-%Pred-Post: 108 %
FEF2575-%Pred-Pre: 113 %
FEV1-%Change-Post: -1 %
FEV1-%Pred-Post: 94 %
FEV1-%Pred-Pre: 95 %
FEV1-Post: 2.94 L
FEV1-Pre: 2.99 L
FEV1FVC-%Change-Post: 0 %
FEV1FVC-%Pred-Pre: 106 %
FEV6-%Change-Post: -1 %
FEV6-%Pred-Post: 92 %
FEV6-%Pred-Pre: 94 %
FEV6-Post: 3.68 L
FEV6-Pre: 3.75 L
FEV6FVC-%Change-Post: 0 %
FEV6FVC-%Pred-Post: 105 %
FEV6FVC-%Pred-Pre: 105 %
FVC-%Change-Post: -1 %
FVC-%Pred-Post: 88 %
FVC-%Pred-Pre: 89 %
FVC-Post: 3.71 L
FVC-Pre: 3.77 L
Post FEV1/FVC ratio: 79 %
Post FEV6/FVC ratio: 99 %
Pre FEV1/FVC ratio: 79 %
Pre FEV6/FVC Ratio: 99 %
RV % pred: -6 %
RV: -0.16 L
TLC % pred: 83 %
TLC: 5.5 L

## 2024-06-27 NOTE — Patient Instructions (Addendum)
 Continue Albuterol  inhaler 2 puffs or 3 mL neb every 6 hours as needed for shortness of breath or wheezing. Notify if symptoms persist despite rescue inhaler/neb use.  Continue zyrtec 1 tab daily Continue flonase  1 spray each nostril daily Continue Advair 2 puffs once daily. Brush tongue and rinse mouth afterwards. If you start having more trouble with your breathing or you're having to use your rescue, increase to Twice daily  Continue pirfenidone  801 mg Three times a day. Take with food   Pneumonia vaccine today Get your RSV, Tdap, and flu when able  Labs today Come back [facility] a [facility] until we see you back in 4 months  Stay active  Lung function testing overall looked some improved CT chest scan was stable  Follow up in 4 months after 30 min PFT with Charles Collins or Charles Eyla Tallon,Charles Collins. If symptoms do not improve or worsen, please contact office for sooner follow up or seek emergency care.

## 2024-06-27 NOTE — Progress Notes (Unsigned)
 @Patient  ID: Charles Collins, male    DOB: 1956-02-27, 68 y.o.   MRN: [id number]  Chief Complaint  Patient presents with   Asthma    Cough only in the morning after using Advair. He reports using Advair just once a day.     Referring provider: Malka Domino, MD  HPI: 68 year old male, former smoker followed for asthma and IPF. He is a patient of Dr. Nelda and last seen in office 02/29/2024. Past medical history significant for celiac artery aneurysm, CAD, hx of melanoma, HLD, hx of diverticulitis.   TEST/EVENTS:  12/10/2023 HRCT chest: Atherosclerosis.  Mediastinal LAD.  Peripheral and mildly basilar predominant subpleural reticulation, groundglass traction bronchiolectasis; UIP pattern consistent with IPF.  Gallstones and possible tiny left renal stone.  Degenerative changes in the spine. 02/25/2024 PFT: FVC 83, FEV1 88, ratio 81, TLC 78, DLCO 64 >> mild restriction with moderate diffusion defect 06/27/2024 PFT: FVC 89, FEV1 95, ratio 79, TLC 83, DLCO 82 >> normal pulmonary function testing  02/29/2024: OV with Dr. Malka.  Initially seen for postviral acute bronchitis that was persistent.  Prompted HRCT chest by his PCP that showed probable UIP.  ANA negative Myo marker 12 panel negative.  No significant systemic symptoms of autoimmune disease.  PFT with mild restriction and mild reduction in DLCO on prior testing.  Continues to have persistent cough and nasal congestion with postnasal drainage.  Repeat CT chest and PFTs in 6 months.  Start pirfenidone .  Start Advair diskus and manage  postnasal drainage with Flonase .  06/27/2024: Today - follow up Discussed the use of AI scribe software for clinical note transcription with the patient, who gave verbal consent to proceed.  History of Present Illness Charles Collins is a 68 year old male with interstitial lung disease who presents for follow-up of his lung function and medication management. He is accompanied by his wife,  Rock.  He was diagnosed with IPF and is currently on pirfenidone  801 mg tid for the past 60 days, tolerating it well without significant side effects such as fatigue, diarrhea, or nausea.  He uses Advair, taking two puffs once a day, although the prescription was written for twice daily use. He says he misread this but he feels like his symptoms are better with just the once daily dosing. He's happy with the control of his cough. He only coughs after using the inhaler in the morning. He also uses a rescue inhaler as needed. His breathing is stable. Able to do most activities without difficulties. No fevers, hemoptysis, weight loss, anorexia, leg swelling.  He uses Flonase  for sinus drainage and is on an allergy pill. He experiences throat clearing, which he attributes to sinus drainage.    Allergies  Allergen Reactions   Atorvastatin      Muscle aches   Diclofenac  Other (See Comments)    Gastritis   Nsaids     H/o gastritis    Immunization History  Administered Date(s) Administered   PNEUMOCOCCAL CONJUGATE-20 06/27/2024   Td 09/03/2002   Tdap 03/18/2012    Past Medical History:  Diagnosis Date   Cancer (HCC) 11/2004   Melanoma, R Knee by Dr Hester   Diverticulitis 04/27/2022   Diverticulosis    DIVERTICULOSIS ISCHEMIC COLITIS VIA COLONOSCOPY(DR. SEIGAL):(01/2001)   H. pylori infection    Hx of dysplastic nevus 06/27/2010   L dorsum mid lat. foot   ILD (interstitial lung disease) (HCC)    Melanoma (HCC)    R knee  Tobacco History: Social History   Tobacco Use  Smoking Status Former   Current packs/day: 0.00   Average packs/day: (0.1 ttl pk-yrs)   Types: Cigarettes   Start date: 11/03/1966   Quit date: 11/03/1968   Years since quitting: 55.6  Smokeless Tobacco Former   Types: Chew  Tobacco Comments   quit over 30 years ago, smoked briefly as a teenager   Counseling given: Not Answered Tobacco comments: quit over 30 years ago, smoked briefly as a  teenager   Outpatient Medications Prior to Visit  Medication Sig Dispense Refill   albuterol  (VENTOLIN  HFA) 108 (90 Base) MCG/ACT inhaler Inhale 2 puffs into the lungs every 6 (six) hours as needed for wheezing or shortness of breath. 18 g 3   cetirizine (ZYRTEC) 10 MG tablet Take 10 mg by mouth daily.     fluticasone  (FLONASE ) 50 MCG/ACT nasal spray Place 1 spray into both nostrils daily. 100 mL 2   fluticasone -salmeterol (ADVAIR HFA) 115-21 MCG/ACT inhaler Inhale 2 puffs into the lungs 2 (two) times daily. 1 each 12   Multiple Vitamins-Minerals (ZINC PO) Take by mouth daily.     Pirfenidone  267 MG TABS Take 3 tablets (801 mg total) by mouth 3 (three) times daily with meals. 270 tablet 3   pravastatin  (PRAVACHOL ) 10 MG tablet Take 1 tablet (10 mg total) by mouth daily. 90 tablet 3   No facility-administered medications prior to visit.     Review of Systems: As above; otherwise negative     Physical Exam:  BP 116/76   Pulse 93   Temp 98.3 F (36.8 C) (Oral)   Ht 5' 8 (1.727 m)   Wt 166 lb 3.2 oz (75.4 kg)   SpO2 99%   BMI 25.27 kg/m   GEN: Pleasant, interactive, well-appearing; in no acute distress HEENT:  Normocephalic and atraumatic. PERRLA. Sclera white. Nasal turbinates pink, moist and patent bilaterally. No rhinorrhea present. Oropharynx pink and moist, without exudate or edema. No lesions, ulcerations, or postnasal drip.  NECK:  Supple w/ fair ROM. No lymphadenopathy.   CV: RRR, no m/r/g, no peripheral edema. Pulses intact, +2 bilaterally. No cyanosis, pallor or clubbing. PULMONARY:  Unlabored, regular breathing. Mild bibasilar crackles otherwise clear bilaterally A&P w/o wheezes/rales/rhonchi. No accessory muscle use GI: BS present and normoactive. Soft, non-tender to palpation. No organomegaly or masses detected. MSK: No erythema, warmth or tenderness. Cap refil <2 sec all extrem.  Neuro: A/Ox3. No focal deficits noted.   Skin: Warm, no lesions or rashe Psych:  Normal affect and behavior. Judgement and thought content appropriate.     Lab Results:  CBC    Component Value Date/Time   WBC 6.7 05/10/2024 0754   RBC 5.02 05/10/2024 0754   HGB 15.1 05/10/2024 0754   HGB 13.9 12/31/2023 0734   HCT 45.3 05/10/2024 0754   HCT 41.4 12/31/2023 0734   PLT 240.0 05/10/2024 0754   PLT 228 12/31/2023 0734   MCV 90.3 05/10/2024 0754   MCV 92 12/31/2023 0734   MCH 30.8 12/31/2023 0734   MCH 30.1 04/22/2022 1700   MCHC 33.3 05/10/2024 0754   RDW 13.7 05/10/2024 0754   RDW 12.9 12/31/2023 0734   LYMPHSABS 1.8 05/10/2024 0754   LYMPHSABS 1.2 12/31/2023 0734   MONOABS 0.5 05/10/2024 0754   EOSABS 0.3 05/10/2024 0754   EOSABS 0.2 12/31/2023 0734   BASOSABS 0.0 05/10/2024 0754   BASOSABS 0.0 12/31/2023 0734    BMET    Component Value Date/Time   NA  139 05/10/2024 0754   NA 141 12/31/2023 0734   K 4.9 05/10/2024 0754   CL 103 05/10/2024 0754   CO2 29 05/10/2024 0754   GLUCOSE 96 05/10/2024 0754   BUN 11 05/10/2024 0754   BUN 11 12/31/2023 0734   CREATININE 1.01 05/10/2024 0754   CALCIUM  9.6 05/10/2024 0754   GFRNONAA >60 04/22/2022 1700   GFRAA >60 04/10/2020 0205    BNP No results found for: BNP   Imaging:  CT CHEST WO CONTRAST Result Date: 06/25/2024 CLINICAL DATA:  Interstitial lung disease EXAM: CT CHEST WITHOUT CONTRAST TECHNIQUE: Multidetector CT imaging of the chest was performed following the standard protocol without IV contrast. RADIATION DOSE REDUCTION: This exam was performed according to the departmental dose-optimization program which includes automated exposure control, adjustment of the mA and/or kV according to patient size and/or use of iterative reconstruction technique. COMPARISON:  12/10/2023 FINDINGS: Cardiovascular: Somewhat limited due to lack of IV contrast. Mild atherosclerotic calcifications of the aorta are noted. No aneurysmal dilatation is seen. No cardiac enlargement is noted. Mediastinum/Nodes: Thoracic  inlet is within normal limits. No hilar or mediastinal adenopathy is noted. Stable likely reactive small mediastinal nodes are seen. The esophagus is within normal limits. Lungs/Pleura: Lungs are well aerated bilaterally. Persistent subpleural reticulation is noted stable in appearance from the prior exam. No focal confluent infiltrate is seen. Stable mild nodularity is again seen. Upper Abdomen: Visualized upper abdomen shows simple appearing cyst in the right kidney as well as diverticulosis. No follow-up of the renal lesion is recommended. Cholelithiasis Musculoskeletal: No chest wall mass or suspicious bone lesions identified. IMPRESSION: Stable parenchymal fibrosis bilaterally likely due to usual interstitial pneumonitis. No acute infiltrate is identified. Stable cholelithiasis. Aortic Atherosclerosis (ICD10-I70.0). Electronically Signed   By: Oneil Devonshire M.D.   On: 06/25/2024 00:03    Administration History     None          Latest Ref Rng & Units 06/27/2024    2:46 PM 02/25/2024    8:15 AM  PFT Results  FVC-Pre L 3.77  P 3.51   FVC-Predicted Pre % 89  P 83   FVC-Post L 3.71  P 3.61   FVC-Predicted Post % 88  P 85   Pre FEV1/FVC % % 79  P 78   Post FEV1/FCV % % 79  P 81   FEV1-Pre L 2.99  P 2.75   FEV1-Predicted Pre % 95  P 88   FEV1-Post L 2.94  P 2.92   DLCO uncorrected ml/min/mmHg 20.41  P 16.11   DLCO [facility]% % 82  P 64   DLVA Predicted % 99  P 83   TLC L 5.50  P 5.16   TLC % Predicted % 83  P 78   RV % Predicted % -6  P 52     P Preliminary result    No results found for: NITRICOXIDE      Assessment & Plan:   Interstitial lung disease (HCC) 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 No autoimmune disease, granulomas or pleural plaques. 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 On antifibrotics and tolerating well. PFT improved compared to prior. Clinical and radiographic stability. Encouraged to remain active. Update vaccines; Prevnar 20 administered today. Avoid sick exposures. Check LFT  today and once a month for first 6 months. Will send request to pharmacy team to update him to the 801 mg tablet Three times a day. Repeat spiro/DLCO in 4 months for monitoring.  Patient Instructions  Continue Albuterol   inhaler 2 puffs or 3 mL neb every 6 hours as needed for shortness of breath or wheezing. Notify if symptoms persist despite rescue inhaler/neb use.  Continue zyrtec 1 tab daily Continue flonase  1 spray each nostril daily Continue Advair 2 puffs once daily. Brush tongue and rinse mouth afterwards. If you start having more trouble with your breathing or you're having to use your rescue, increase to Twice daily  Continue pirfenidone  801 mg Three times a day. Take with food   Pneumonia vaccine today Get your RSV, Tdap, and flu when able  Labs today Come back [facility] a [facility] until we see you back in 4 months  Stay active  Lung function testing overall looked some improved CT chest scan was stable  Follow up in 4 months after 30 min PFT with Dr. Malka or Izetta Talina Pleitez,NP. If symptoms do not improve or worsen, please contact office for sooner follow up or seek emergency care.    Mild persistent asthma Compensated on current regimen. Aware that recommended dosing is bid; however, he is improved on current regimen and tolerating well so will hold off on increase. If he has an exacerbation or poor control, will need to increase to bid dosing. Action plan in place. Trigger prevention reviewed.    Advised if symptoms do not improve or worsen, to please contact office for sooner follow up or seek emergency care.   I spent 35 minutes of dedicated to the care of this patient on the date of this encounter to include pre-visit review of records, face-to-face time with the patient discussing conditions above, post visit ordering of testing, clinical documentation with the electronic health record, making appropriate referrals as documented, and communicating  necessary findings to members of the patients care team.  Comer LULLA Rouleau, NP 06/28/2024  Pt aware and understands NP's role.

## 2024-06-27 NOTE — Patient Instructions (Signed)
 Full PFT completed today ? ?

## 2024-06-27 NOTE — Progress Notes (Signed)
 Full PFT completed today ? ?

## 2024-06-28 ENCOUNTER — Ambulatory Visit (INDEPENDENT_AMBULATORY_CARE_PROVIDER_SITE_OTHER)

## 2024-06-28 ENCOUNTER — Telehealth: Payer: Self-pay | Admitting: Nurse Practitioner

## 2024-06-28 ENCOUNTER — Ambulatory Visit: Payer: Self-pay | Admitting: Nurse Practitioner

## 2024-06-28 ENCOUNTER — Encounter: Payer: Self-pay | Admitting: Nurse Practitioner

## 2024-06-28 VITALS — Ht 68.0 in | Wt 166.0 lb

## 2024-06-28 DIAGNOSIS — Z0000 Encounter for general adult medical examination without abnormal findings: Secondary | ICD-10-CM

## 2024-06-28 DIAGNOSIS — J4530 Mild persistent asthma, uncomplicated: Secondary | ICD-10-CM | POA: Insufficient documentation

## 2024-06-28 DIAGNOSIS — J849 Interstitial pulmonary disease, unspecified: Secondary | ICD-10-CM

## 2024-06-28 NOTE — Assessment & Plan Note (Signed)
 Compensated on current regimen. Aware that recommended dosing is bid; however, he is improved on current regimen and tolerating well so will hold off on increase. If he has an exacerbation or poor control, will need to increase to bid dosing. Action plan in place. Trigger prevention reviewed.

## 2024-06-28 NOTE — Patient Instructions (Signed)
 Mr. Oakland , Thank you for taking time out of your busy schedule to complete your Annual Wellness Visit with me. I enjoyed our conversation and look forward to speaking with you again next year. I, as well as your care team,  appreciate your ongoing commitment to your health goals. Please review the following plan we discussed and let me know if I can assist you in the future. Your Game plan/ To Do List    Referrals: If you haven't heard from the office you've been referred to, please reach out to them at the phone provided.   Follow up Visits: We will see or speak with you next year for your Next Medicare AWV with our clinical staff Have you seen your provider in the last 6 months (3 months if uncontrolled diabetes)? Yes  Clinician Recommendations:  Aim for 30 minutes of exercise or brisk walking, 6-8 glasses of water , and 5 servings of fruits and vegetables each day.       This is a list of the screenings recommended for you:  Health Maintenance  Topic Date Due   Zoster (Shingles) Vaccine (1 of 2) Never done   DTaP/Tdap/Td vaccine (3 - Td or Tdap) 03/18/2022   Flu Shot  06/03/2024   Medicare Annual Wellness Visit  06/28/2025   Colon Cancer Screening  05/29/2029   Pneumococcal Vaccine for age over 39  Completed   Hepatitis C Screening  Completed   HPV Vaccine  Aged Out   Meningitis B Vaccine  Aged Out   COVID-19 Vaccine  Discontinued    Advanced directives: (Copy Requested) Please bring a copy of your health care power of attorney and living will to the office to be added to your chart at your convenience. You can mail to [facility] [address] [place], [place] [address] or email to [email or web address] Advance Care Planning is important because it:  [x]  Makes sure you receive the medical care that is consistent with your values, goals, and preferences  [x]  It provides guidance to your family and loved ones and reduces their decisional burden about  whether or not they are making the right decisions based on your wishes.  Follow the link provided in your after visit summary or read over the paperwork we have mailed to you to help you started getting your Advance Directives in place. If you need assistance in completing these, please reach out to us  so that we can help you!

## 2024-06-28 NOTE — Assessment & Plan Note (Signed)
 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 No autoimmune disease, granulomas or pleural plaques. 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 On antifibrotics and tolerating well. PFT improved compared to prior. Clinical and radiographic stability. Encouraged to remain active. Update vaccines; Prevnar 20 administered today. Avoid sick exposures. Check LFT today and once a month for first 6 months. Will send request to pharmacy team to update him to the 801 mg tablet Three times a day. Repeat spiro/DLCO in 4 months for monitoring.  Patient Instructions  Continue Albuterol  inhaler 2 puffs or 3 mL neb every 6 hours as needed for shortness of breath or wheezing. Notify if symptoms persist despite rescue inhaler/neb use.  Continue zyrtec 1 tab daily Continue flonase  1 spray each nostril daily Continue Advair 2 puffs once daily. Brush tongue and rinse mouth afterwards. If you start having more trouble with your breathing or you're having to use your rescue, increase to Twice daily  Continue pirfenidone  801 mg Three times a day. Take with food   Pneumonia vaccine today Get your RSV, Tdap, and flu when able  Labs today Come back once a [facility] until we see you back in 4 months  Stay active  Lung function testing overall looked some improved CT chest scan was stable  Follow up in 4 months after 30 min PFT with Dr. Malka or Izetta Klee Kolek,NP. If symptoms do not improve or worsen, please contact office for sooner follow up or seek emergency care.

## 2024-06-28 NOTE — Progress Notes (Signed)
 Subjective:   Charles Collins is a 68 y.o. who presents for a Medicare Wellness preventive visit.  As a reminder, Annual Wellness Visits don't include a physical exam, and some assessments may be limited, especially if this visit is performed virtually. We may recommend an in-person follow-up visit with your provider if needed.  Visit Complete: Virtual I connected with  Charles Collins on 06/28/24 by a audio enabled telemedicine application and verified that I am speaking with the correct person using two identifiers.  Patient Location: Home  Provider Location: Home Office  I discussed the limitations of evaluation and management by telemedicine. The patient expressed understanding and agreed to proceed.  Vital Signs: Because this visit was a virtual/telehealth visit, some criteria may be missing or patient reported. Any vitals not documented were not able to be obtained and vitals that have been documented are patient reported.  VideoDeclined- This patient declined Librarian, academic. Therefore the visit was completed with audio only.  Persons Participating in Visit: Patient.  AWV Questionnaire: No: Patient Medicare AWV questionnaire was not completed prior to this visit.  Cardiac Risk Factors include: advanced age (>35men, >47 women);male gender;dyslipidemia     Objective:    Today's Vitals   06/28/24 1433  Weight: 166 lb (75.3 kg)  Height: 5' 8 (1.727 m)   Body mass index is 25.24 kg/m.     06/28/2024    2:40 PM 06/04/2023   10:34 AM 05/29/2022    7:01 AM 04/21/2022    6:27 PM 04/10/2020   11:34 AM 04/09/2020    6:37 AM  Advanced Directives  Does Patient Have a Medical Advance Directive? Yes Yes Yes No Yes Yes  Type of Estate agent of [place];Living will Living will;Healthcare Power of [employer] Power of [place];Living will  Healthcare Power of [place];Living will Healthcare Power of Attorney  Does  patient want to make changes to medical advance directive?   No - Patient declined     Copy of Healthcare Power of Attorney in Chart? No - copy requested No - copy requested No - copy requested  No - copy requested   Would patient like information on creating a medical advance directive?    No - Patient declined      Current Medications (verified) Outpatient Encounter Medications as of 06/28/2024  Medication Sig   albuterol  (VENTOLIN  HFA) 108 (90 Base) MCG/ACT inhaler Inhale 2 puffs into the lungs every 6 (six) hours as needed for wheezing or shortness of breath.   cetirizine (ZYRTEC) 10 MG tablet Take 10 mg by mouth daily.   fluticasone  (FLONASE ) 50 MCG/ACT nasal spray Place 1 spray into both nostrils daily.   fluticasone -salmeterol (ADVAIR HFA) 115-21 MCG/ACT inhaler Inhale 2 puffs into the lungs 2 (two) times daily.   Multiple Vitamins-Minerals (ZINC PO) Take by mouth daily.   Pirfenidone  267 MG TABS Take 3 tablets (801 mg total) by mouth 3 (three) times daily with meals.   No facility-administered encounter medications on file as of 06/28/2024.    Allergies (verified) Atorvastatin , Diclofenac , and Nsaids   History: Past Medical History:  Diagnosis Date   Cancer (HCC) 11/2004   Melanoma, R Knee by Dr Hester   Diverticulitis 04/27/2022   Diverticulosis    DIVERTICULOSIS ISCHEMIC COLITIS VIA COLONOSCOPY(DR. SEIGAL):(01/2001)   H. pylori infection    Hx of dysplastic nevus 06/27/2010   L dorsum mid lat. foot   ILD (interstitial lung disease) (HCC)    Melanoma (HCC)  R knee    Past Surgical History:  Procedure Laterality Date   APPENDECTOMY     As Teen   BALLOON DILATION  06/04/2023   Procedure: BALLOON DILATION;  Surgeon: Unk Corinn Skiff, MD;  Location[facility] ENDOSCOPY;  Service: Gastroenterology;;   BIOPSY  06/04/2023   Procedure: BIOPSY;  Surgeon: Unk Corinn Skiff, MD;  Location: [facility] ENDOSCOPY;  Service: Gastroenterology;;   COLONOSCOPY WITH PROPOFOL  N/A 05/29/2022    Procedure: COLONOSCOPY WITH BIOSPY;  Surgeon: Unk Corinn Skiff, MD;  Location: [facility];  Service: Endoscopy;  Laterality: N/A;   ESOPHAGOGASTRODUODENOSCOPY (EGD) WITH PROPOFOL  N/A 04/10/2020   Procedure: ESOPHAGOGASTRODUODENOSCOPY (EGD) WITH PROPOFOL ;  Surgeon: Unk Corinn Skiff, MD;  Location: [facility] ENDOSCOPY;  Service: Gastroenterology;  Laterality: N/A;   ESOPHAGOGASTRODUODENOSCOPY (EGD) WITH PROPOFOL  N/A 06/04/2023   Procedure: ESOPHAGOGASTRODUODENOSCOPY (EGD) WITH PROPOFOL ;  Surgeon: Unk Corinn Skiff, MD;  Location: [facility] ENDOSCOPY;  Service: Gastroenterology;  Laterality: N/A;   KNEE SURGERY     Open Repair Right as Teen   MELANOMA EXCISION Right 2006   Knee. Clark's level IV, Breslow's 0.42mm   POLYPECTOMY N/A 05/29/2022   Procedure: POLYPECTOMY;  Surgeon: Unk Corinn Skiff, MD;  Location: [facility];  Service: Endoscopy;  Laterality: N/A;   Family History  Problem Relation Age of Onset   Stroke Mother 47       Cause of Death, [place]   Diabetes Father    Hypertension Father    Obesity Father    Kidney disease Father    Kidney cancer Father    Pancreatic cancer Father        presumed, no biopsy done   Arthritis Father    Hypertension Paternal Grandfather    Heart disease Neg Hx    Cancer Neg Hx    Depression Neg Hx    Drug abuse Neg Hx    Alcohol abuse Neg Hx    Colon cancer Neg Hx    Prostate cancer Neg Hx    Social History   Socioeconomic History   Marital status: Married    Spouse name: Not on file   Number of children: 2   Years of education: Not on file   Highest education level: Associate degree: academic program  Occupational History   Occupation: Scientist, physiological  Tobacco Use   Smoking status: Former    Current packs/day: 0.00    Average packs/day: (0.1 ttl pk-yrs)    Types: Cigarettes    Start date: 11/03/1966    Quit date: 11/03/1968    Years since quitting: 55.6   Smokeless tobacco: Former    Types: Chew    Tobacco comments:    quit over 30 years ago, smoked briefly as a teenager  Vaping Use   Vaping status: Never Used  Substance and Sexual Activity   Alcohol use: No   Drug use: No   Sexual activity: Yes  Other Topics Concern   Not on file  Social History Narrative   Marital Status: Married 1978 LIVES WITH WIFE   Children: 2 DAUGHTERS BOTH OUT AT HOME   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    [employer] fan   5 grandkids   Social Drivers of Health   Financial Resource Strain: Low Risk  (06/28/2024)   Overall Financial Resource Strain (CARDIA)    Difficulty of Paying Living Expenses: Not hard at all  Food Insecurity: No Food Insecurity (06/28/2024)   Hunger Vital Sign  Worried About Programme researcher, broadcasting/film/video in the Last Year: Never true    Ran Out of Food in the Last Year: Never true  Transportation Needs: No Transportation Needs (06/28/2024)   PRAPARE - Administrator, Civil Service (Medical): No    Lack of Transportation (Non-Medical): No  Physical Activity: Insufficiently Active (06/28/2024)   Exercise Vital Sign    Days of Exercise per Week: 3 days    Minutes of Exercise per Session: 30 min  Stress: No Stress Concern Present (06/28/2024)   [employer] of Occupational Health - Occupational Stress Questionnaire    Feeling of Stress: Not at all  Social Connections: Socially Integrated (06/28/2024)   Social Connection and Isolation Panel    Frequency of Communication with Friends and Family: More than three times a week    Frequency of Social Gatherings with Friends and Family: More than three times a week    Attends Religious Services: More than 4 times per year    Active Member of [employer] or Organizations: Yes    Attends Engineer, structural: More than 4 times per year    Marital Status: Married    Tobacco Counseling Counseling given: Not Answered Tobacco comments: quit over 30 years ago, smoked  briefly as a teenager    Clinical Intake:  Pre-visit preparation completed: Yes  Pain : No/denies pain     BMI - recorded: 25.24 Nutritional Status: BMI 25 -29 Overweight Nutritional Risks: None Diabetes: No  No results found for: HGBA1C   How often do you need to have someone help you when you read instructions, pamphlets, or other written materials from your doctor or pharmacy?: 1 - Never  Interpreter Needed?: No  Comments: lives with wife Information entered by :: B.Amarie Tarte,LPN   Activities of Daily Living     06/28/2024    2:41 PM  In your present state of health, do you have any difficulty performing the following activities:  Hearing? 0  Vision? 0  Difficulty concentrating or making decisions? 0  Walking or climbing stairs? 0  Dressing or bathing? 0  Doing errands, shopping? 0  Preparing Food and eating ? N  Using the Toilet? N  In the past six months, have you accidently leaked urine? N  Do you have problems with loss of bowel control? N  Managing your Medications? N  Managing your Finances? N  Housekeeping or managing your Housekeeping? N    Patient Care Team: Cleatus Arlyss RAMAN, MD as PCP - General (Family Medicine) Malka Domino, MD as Consulting Physician (Pulmonary Disease) Carolee Manus DASEN., MD (Ophthalmology)  I have updated your Care Teams any recent Medical Services you may have received from other providers in the past year.     Assessment:   This is a routine wellness examination for Samson.  Hearing/Vision screen Hearing Screening - Comments:: Patient denies any hearing difficulties.   Vision Screening - Comments:: Pt says their vision is good without glasses;readers only Dr  SHAUNNA Carolee   Goals Addressed             This Visit's Progress    Patient Stated       I want to be more mobile and flexible       Depression Screen     06/28/2024    2:38 PM 05/17/2024    8:30 AM 05/14/2023   10:59 AM 04/24/2022    8:52 AM  04/23/2021    2:14 PM 04/09/2018    8:56 AM  PHQ 2/9 Scores  PHQ - 2 Score 0 0 0 0 0 0  PHQ- 9 Score  0 0  0     Fall Risk     06/28/2024    2:35 PM 05/17/2024    8:30 AM 05/14/2023   10:59 AM 04/24/2022    8:52 AM 04/23/2021    2:14 PM  Fall Risk   Falls in the past year? 0 0 0 0 0  Number falls in past yr: 0 0 0  0  Injury with Fall? 0 0 0  0  Risk for fall due to : No Fall Risks No Fall Risks No Fall Risks  No Fall Risks  Follow up Education provided;Falls prevention discussed Falls evaluation completed Falls evaluation completed  Falls evaluation completed      Data saved with a previous flowsheet row definition    MEDICARE RISK AT HOME:  Medicare Risk at Home Any stairs in or around the home?: Yes If so, are there any without handrails?: Yes Home free of loose throw rugs in walkways, pet beds, electrical cords, etc?: Yes Adequate lighting in your home to reduce risk of falls?: Yes Life alert?: No Use of a cane, walker or w/c?: No Grab bars in the bathroom?: No Shower chair or bench in shower?: Yes Elevated toilet seat or a handicapped toilet?: Yes  TIMED UP AND GO:  Was the test performed?  No  Cognitive Function: 6CIT completed        06/28/2024    2:42 PM  6CIT Screen  What Year? 0 points  What month? 0 points  What time? 0 points  Count back from 20 0 points  Months in reverse 0 points  Repeat phrase 0 points  Total Score 0 points    Immunizations Immunization History  Administered Date(s) Administered   PNEUMOCOCCAL CONJUGATE-20 06/27/2024   Td 09/03/2002   Tdap 03/18/2012    Screening Tests Health Maintenance  Topic Date Due   Zoster Vaccines- Shingrix (1 of 2) Never done   DTaP/Tdap/Td (3 - Td or Tdap) 03/18/2022   INFLUENZA VACCINE  06/03/2024   Medicare Annual Wellness (AWV)  06/28/2025   Colonoscopy  05/29/2029   Pneumococcal Vaccine: 50+ Years  Completed   Hepatitis C Screening  Completed   HPV VACCINES  Aged Out   Meningococcal B  Vaccine  Aged Out   COVID-19 Vaccine  Discontinued    Health Maintenance  Health Maintenance Due  Topic Date Due   Zoster Vaccines- Shingrix (1 of 2) Never done   DTaP/Tdap/Td (3 - Td or Tdap) 03/18/2022   INFLUENZA VACCINE  06/03/2024   Health Maintenance Items Addressed: None at this time due  Additional Screening:  Vision Screening: Recommended annual ophthalmology exams for early detection of glaucoma and other disorders of the eye. Would you like a referral to an eye doctor? No    Dental Screening: Recommended annual dental exams for proper oral hygiene  Community Resource Referral / Chronic Care Management: CRR required this visit?  No   CCM required this visit?  No   Plan:    I have personally reviewed and noted the following in the patient's chart:   Medical and social history Use of alcohol, tobacco or illicit drugs  Current medications and supplements including opioid prescriptions. Patient is not currently taking opioid prescriptions. Functional ability and status Nutritional status Physical activity Advanced directives List of other physicians Hospitalizations, surgeries, and ER visits in previous 12 months Vitals Screenings to include  cognitive, depression, and falls Referrals and appointments  In addition, I have reviewed and discussed with patient certain preventive protocols, quality metrics, and best practice recommendations. A written personalized care plan for preventive services as well as general preventive health recommendations were provided to patient.   Erminio LITTIE Saris, LPN   1/73/7974   After Visit Summary: (MyChart) Due to this being a telephonic visit, the after visit summary with patients personalized plan was offered to patient via MyChart   Notes: Nothing significant to report at this time.

## 2024-06-28 NOTE — Progress Notes (Signed)
 LFT stable. I sent a message to the pharmacy team to ask them to update his pirfenidone  (Esbriet ) order to 801 mg tablet Three times a day. Thanks.

## 2024-06-28 NOTE — Telephone Encounter (Signed)
 Pt doing well on pirfenidone  3 tablets Three times a day. LFT stable. Please update his rx to 801 mg tablet Three times a day. Thanks!

## 2024-06-29 ENCOUNTER — Other Ambulatory Visit: Payer: Self-pay

## 2024-06-29 DIAGNOSIS — J309 Allergic rhinitis, unspecified: Secondary | ICD-10-CM

## 2024-06-29 MED ORDER — PIRFENIDONE 801 MG PO TABS
801.0000 mg | ORAL_TABLET | Freq: Three times a day (TID) | ORAL | 2 refills | Status: DC
  Filled 2024-06-29 – 2024-07-08 (×2): qty 90, 30d supply, fill #0

## 2024-06-29 MED ORDER — AZELASTINE HCL 0.1 % NA SOLN: 2.0000 | Freq: Two times a day (BID) | NASAL | 5 refills | Status: AC

## 2024-06-29 NOTE — Telephone Encounter (Signed)
 LFTs wnl 06/27/24. Last OV 06/27/24. Rx for pirfenidone  801mg  TID prescribed.   Aleck Puls, PharmD, BCPS Clinical Pharmacist  [facility]

## 2024-07-08 ENCOUNTER — Other Ambulatory Visit: Payer: Self-pay

## 2024-07-08 ENCOUNTER — Other Ambulatory Visit (HOSPITAL_COMMUNITY): Payer: Self-pay

## 2024-07-09 ENCOUNTER — Encounter: Payer: Self-pay | Admitting: Pulmonary Disease

## 2024-07-11 ENCOUNTER — Telehealth: Payer: Self-pay

## 2024-07-11 DIAGNOSIS — Z5181 Encounter for therapeutic drug level monitoring: Secondary | ICD-10-CM

## 2024-07-11 DIAGNOSIS — J849 Interstitial pulmonary disease, unspecified: Secondary | ICD-10-CM

## 2024-07-11 NOTE — Telephone Encounter (Signed)
 Patient with rash on pirfenidone , was instructed to hold medication. Spoke with Dr. Malka on the phone. Plan to continue holding medication and pharmacy to follow-up with patient in 2 weeks to assess for improvement.   ATC patient, LVM with contact number to return call.   Aleck Puls, PharmD, BCPS Clinical Pharmacist  [facility]

## 2024-07-11 NOTE — Telephone Encounter (Signed)
 Patient returned call. Patient and spouse are present for telephone encounter.   He reports rash started about a week ago. Prior to this, denies any significant issues/side effects on pirfenidone . Endorses loose stools, headache, but these side effects were manageable.   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 Did not wear sunscreen. Reports he wasn't thinking about wearing sunscreen until it was too late.  Last dose of pirfenidone  - he took one tablet (801mg ) yesterday.   Discussed plan with Dr. Malka -  - HOLD pirfenidone  completely for at least 2 weeks to allow rash to resolve.  - PharmD will contact patient in 2 weeks to assess for improvement in rash.  - Patient to continue treatment of rash with hydrocortisone cream OTC as he discussed with Dr. Malka.  - We discussed that once rash resolves, may retrial pirfenidone  and restart titration. This time, would recommend patient use consistent sunscreen routine as he is now.   Patient and spouse verbalize understanding and agreement with plan.  Aleck Puls, PharmD, BCPS Clinical Pharmacist  [facility]

## 2024-07-11 NOTE — Telephone Encounter (Signed)
 The patient came into the office asking about a rash that has come up on his arms and neck. He said he started the Pirfenidone  801 MG 60 days ago. The rash on his arm came up 5-6 days ago. He stopped the Pirfenidone  801 MG 3 days ago and the rash has not went away. He is using OTC itch cream and it is not helping.  He said he has not started taking any other new medications.

## 2024-07-11 NOTE — Telephone Encounter (Signed)
 Noted. Nothing further needed.

## 2024-07-12 ENCOUNTER — Other Ambulatory Visit: Payer: Self-pay

## 2024-07-13 NOTE — Telephone Encounter (Signed)
 Noted

## 2024-07-13 NOTE — Telephone Encounter (Signed)
 Please ensure acute/sick messages go to provider of the day when a provider is out of office. Appears this was addressed last week when pt came to the office. See telephone encounter. Thanks.

## 2024-07-26 NOTE — Telephone Encounter (Signed)
 Called patient for 2 week check-in after holding pirfenidone  d/t rash.  Rash on neck and forearms improved, not yet resolved. He is wearing sunscreen.   Since last contact, he developed rash on both legs x 1 week ago - from knee down. Minimal sun exposure in these areas. Treating with cortisone cream which is helping.  Has not used pirfenidone . Reports overall rash improved, not yet resolved.   Advised patient to continue holding pirfenidone . Contact us  if rash worsens. Will contact patient in 2 weeks to further assess change.   Aleck Puls, PharmD, BCPS Clinical Pharmacist  [facility]

## 2024-07-27 ENCOUNTER — Other Ambulatory Visit: Payer: Self-pay

## 2024-08-11 ENCOUNTER — Other Ambulatory Visit: Payer: Self-pay

## 2024-08-11 MED ORDER — PIRFENIDONE 267 MG PO TABS
801.0000 mg | ORAL_TABLET | Freq: Three times a day (TID) | ORAL | 1 refills | Status: DC
  Filled 2024-08-11 – 2024-09-08 (×3): qty 270, 30d supply, fill #0
  Filled 2024-10-06: qty 270, 30d supply, fill #1

## 2024-08-11 MED ORDER — PIRFENIDONE 267 MG PO TABS
ORAL_TABLET | ORAL | 0 refills | Status: DC
  Filled 2024-08-11: qty 207, fill #0
  Filled 2024-08-15: qty 207, 30d supply, fill #0

## 2024-08-11 NOTE — Telephone Encounter (Signed)
**Note De-identified  [place] Obfuscation** Please advise 

## 2024-08-11 NOTE — Telephone Encounter (Signed)
 Spoke to patient - reports rash is just about completely gone and estimates rash is 90% resolved except for scaly area on his forearm that he describes as not bad at all.  Will restart pirfenidone  with titration starting from lowest dose -  Pirfenidone  267mg  tab, Take 1 tab by mouth three times daily with food for 7 days, then take 2 tabs three times daily for 7 days, then take 3 tabs three times daily thereafter.   If skin rash returns or new onset side effects occur, call clinic. Pharmacy contact information was also provided for direct contact to pharmacy team.   LFTs wnl - 06/27/24. Repeat LFTs in 1 month after restarting pirfenidone . He plans to have labs drawn at [facility] lab.  Last OV 06/27/24. Next OV due December 2025, not yet scheduled. Routing to scheduling team for assistance with scheduling.   Patient verbalizes understanding and agreement with plan.   Aleck Puls, PharmD, BCPS, CPP Clinical Pharmacist  [facility]

## 2024-08-11 NOTE — Addendum Note (Signed)
 Addended by: Yuvia Plant L on: 08/11/2024 02:38 PM   Modules accepted: Orders

## 2024-08-15 ENCOUNTER — Other Ambulatory Visit: Payer: Self-pay

## 2024-08-15 NOTE — Progress Notes (Signed)
 Specialty Pharmacy Refill Coordination Note  Charles Collins is a 68 y.o. male contacted today regarding refills of specialty medication(s) Pirfenidone    Patient requested Delivery   Delivery date: 08/17/24   Verified address: [address]  [place] [address]   Medication will be filled on 08/16/24.

## 2024-08-16 ENCOUNTER — Other Ambulatory Visit (HOSPITAL_COMMUNITY): Payer: Self-pay

## 2024-08-16 ENCOUNTER — Other Ambulatory Visit: Payer: Self-pay

## 2024-09-08 ENCOUNTER — Other Ambulatory Visit (HOSPITAL_COMMUNITY): Payer: Self-pay

## 2024-09-08 ENCOUNTER — Other Ambulatory Visit: Payer: Self-pay

## 2024-09-12 ENCOUNTER — Other Ambulatory Visit: Payer: Self-pay

## 2024-09-13 ENCOUNTER — Other Ambulatory Visit (HOSPITAL_COMMUNITY): Payer: Self-pay

## 2024-09-13 ENCOUNTER — Other Ambulatory Visit: Payer: Self-pay

## 2024-09-13 NOTE — Progress Notes (Signed)
 Specialty Pharmacy Refill Coordination Note  MyChart Questionnaire Submission  Charles Collins is a 68 y.o. male contacted today regarding refills of specialty medication(s) Pirfenidone .  Doses on hand: (Patient-Rptd) 30 days at least (Patient should have around 5 days remaining)  Patient requested: (Patient-Rptd) Delivery   Delivery date: 09/15/24  Verified address: [address] [place] [address]  Medication will be filled on 09/14/24.   Patient's copay increased to $175.89. Left patient a voicemail and mychart message. Patient has paid this in the past.

## 2024-10-03 ENCOUNTER — Encounter: Payer: Self-pay | Admitting: Pulmonary Disease

## 2024-10-03 DIAGNOSIS — Z5181 Encounter for therapeutic drug level monitoring: Secondary | ICD-10-CM

## 2024-10-03 DIAGNOSIS — J849 Interstitial pulmonary disease, unspecified: Secondary | ICD-10-CM

## 2024-10-06 ENCOUNTER — Other Ambulatory Visit (HOSPITAL_COMMUNITY): Payer: Self-pay

## 2024-10-08 ENCOUNTER — Other Ambulatory Visit (HOSPITAL_COMMUNITY): Payer: Self-pay

## 2024-10-09 ENCOUNTER — Other Ambulatory Visit (HOSPITAL_COMMUNITY): Payer: Self-pay

## 2024-10-10 ENCOUNTER — Other Ambulatory Visit (HOSPITAL_COMMUNITY): Payer: Self-pay

## 2024-10-11 ENCOUNTER — Other Ambulatory Visit: Payer: Self-pay

## 2024-10-11 ENCOUNTER — Other Ambulatory Visit (HOSPITAL_COMMUNITY): Payer: Self-pay

## 2024-10-11 NOTE — Progress Notes (Signed)
 Specialty Pharmacy Refill Coordination Note  Spoke with Charles Collins is a 68 y.o. male contacted today regarding refills of specialty medication(s) Pirfenidone   Doses on hand: 20 days (Patient was previously taking 1 tablet with each meal (three times daily) per MD instructions but has now resumed taking the medication as originally prescribed.)  Patient requested: Delivery   Delivery date: 10/26/24   Verified address: [address] [place] [address]  Medication will be filled on 10/25/24

## 2024-10-12 ENCOUNTER — Other Ambulatory Visit
Admission: RE | Admit: 2024-10-12 | Discharge: 2024-10-12 | Disposition: A | Discharge status: Home / Self Care | Attending: Pulmonary Disease | Admitting: Pulmonary Disease

## 2024-10-12 DIAGNOSIS — Z5181 Encounter for therapeutic drug level monitoring: Secondary | ICD-10-CM | POA: Insufficient documentation

## 2024-10-12 DIAGNOSIS — J849 Interstitial pulmonary disease, unspecified: Secondary | ICD-10-CM | POA: Diagnosis present

## 2024-10-12 LAB — HEPATIC FUNCTION PANEL
ALT: 13 U/L (ref 0–44)
AST: 22 U/L (ref 15–41)
Albumin: 4.2 g/dL (ref 3.5–5.0)
Alkaline Phosphatase: 88 U/L (ref 38–126)
Bilirubin, Direct: 0.1 mg/dL (ref 0.0–0.2)
Indirect Bilirubin: 0.2 mg/dL — ABNORMAL LOW (ref 0.3–0.9)
Total Bilirubin: 0.3 mg/dL (ref 0.0–1.2)
Total Protein: 7 g/dL (ref 6.5–8.1)

## 2024-10-13 ENCOUNTER — Ambulatory Visit: Payer: Self-pay

## 2024-10-25 ENCOUNTER — Other Ambulatory Visit: Payer: Self-pay

## 2024-11-17 ENCOUNTER — Other Ambulatory Visit: Payer: Self-pay

## 2024-11-17 ENCOUNTER — Other Ambulatory Visit: Payer: Self-pay | Admitting: Pulmonary Disease

## 2024-11-17 DIAGNOSIS — J849 Interstitial pulmonary disease, unspecified: Secondary | ICD-10-CM

## 2024-11-17 NOTE — Progress Notes (Signed)
 Specialty Pharmacy Refill Coordination Note  Charles Collins is a 69 y.o. male contacted today regarding refills of specialty medication(s) Pirfenidone    Patient requested Delivery   Delivery date: 11/25/24   Verified address: [address] [place] [address]   Medication will be filled on: 11/24/24    This fill date is pending response to refill request from provider. Patient is aware and if they have not received fill by intended date they must follow up with pharmacy.

## 2024-11-18 ENCOUNTER — Other Ambulatory Visit: Payer: Self-pay

## 2024-11-18 MED ORDER — PIRFENIDONE 267 MG PO TABS
801.0000 mg | ORAL_TABLET | Freq: Three times a day (TID) | ORAL | 2 refills | Status: DC
  Filled 2024-11-18: qty 270, 30d supply, fill #0

## 2024-11-23 ENCOUNTER — Ambulatory Visit: Payer: Self-pay | Admitting: Nurse Practitioner

## 2024-11-23 ENCOUNTER — Other Ambulatory Visit
Admission: RE | Admit: 2024-11-23 | Discharge: 2024-11-23 | Disposition: A | Discharge status: Home / Self Care | Source: Ambulatory Visit | Attending: Nurse Practitioner | Admitting: Nurse Practitioner

## 2024-11-23 DIAGNOSIS — J849 Interstitial pulmonary disease, unspecified: Secondary | ICD-10-CM | POA: Diagnosis present

## 2024-11-23 LAB — HEPATIC FUNCTION PANEL
ALT: 15 U/L (ref 0–44)
AST: 24 U/L (ref 15–41)
Albumin: 4.3 g/dL (ref 3.5–5.0)
Alkaline Phosphatase: 85 U/L (ref 38–126)
Bilirubin, Direct: 0.1 mg/dL (ref 0.0–0.2)
Indirect Bilirubin: 0.3 mg/dL (ref 0.3–0.9)
Total Bilirubin: 0.4 mg/dL (ref 0.0–1.2)
Total Protein: 7.4 g/dL (ref 6.5–8.1)

## 2024-11-23 NOTE — Progress Notes (Signed)
 LFTs normal. Can decide timing of next lab draw with Dr. Malka at f/u. Thanks.

## 2024-11-24 ENCOUNTER — Other Ambulatory Visit: Payer: Self-pay

## 2024-11-24 ENCOUNTER — Ambulatory Visit

## 2024-11-24 DIAGNOSIS — J849 Interstitial pulmonary disease, unspecified: Secondary | ICD-10-CM | POA: Diagnosis not present

## 2024-11-24 LAB — PULMONARY FUNCTION TEST
DL/VA % pred: 87 %
DL/VA: 3.58 ml/min/mmHg/L
DLCO unc % pred: 73 %
DLCO unc: 18.07 ml/min/mmHg
FEF 25-75 Pre: 2.74 L/s
FEF2575-%Pred-Pre: 112 %
FEV1-%Pred-Pre: 95 %
FEV1-Pre: 2.97 L
FEV1FVC-%Pred-Pre: 106 %
FEV6-%Pred-Pre: 94 %
FEV6-Pre: 3.75 L
FEV6FVC-%Pred-Pre: 105 %
FVC-%Pred-Pre: 89 %
FVC-Pre: 3.75 L
Pre FEV1/FVC ratio: 79 %
Pre FEV6/FVC Ratio: 100 %
RV % pred: 91 %
RV: 2.08 L
TLC % pred: 88 %
TLC: 5.82 L

## 2024-11-24 NOTE — Patient Instructions (Signed)
 Full PFT completed without post.

## 2024-11-24 NOTE — Progress Notes (Signed)
 Full PFT completed without post per order.

## 2024-11-25 ENCOUNTER — Ambulatory Visit: Admitting: Pulmonary Disease

## 2024-11-25 ENCOUNTER — Encounter: Payer: Self-pay | Admitting: Pulmonary Disease

## 2024-11-25 ENCOUNTER — Other Ambulatory Visit: Payer: Self-pay

## 2024-11-25 ENCOUNTER — Encounter

## 2024-11-25 VITALS — BP 110/60 | HR 63 | Temp 98.1°F | Ht 68.0 in | Wt 166.0 lb

## 2024-11-25 DIAGNOSIS — J849 Interstitial pulmonary disease, unspecified: Secondary | ICD-10-CM | POA: Diagnosis not present

## 2024-11-25 DIAGNOSIS — E785 Hyperlipidemia, unspecified: Secondary | ICD-10-CM

## 2024-11-25 MED ORDER — PIRFENIDONE 801 MG PO TABS
801.0000 mg | ORAL_TABLET | Freq: Three times a day (TID) | ORAL | 3 refills | Status: AC
  Filled 2024-11-28: qty 90, 30d supply, fill #0

## 2024-11-25 NOTE — Progress Notes (Signed)
 "  Synopsis: Referred in by Malachy Comer GAILS, NP   Subjective:   PATIENT ID: Evalene CHRISTELLA Karma GENDER: male DOB: 15-Jan-1956, MRN: [id number]  No chief complaint on file.   HPI Mr. Leanos is a pleasant 69 year old male patient with a past medical history of seasonal allergies on Zyrtec and hyperlipidemia on atorvastatin  presenting today to the pulmonary clinic for a follow up visit.   He initially saw me for a post viral acute bronchitis that was persistent. Which prompted a High res CT chest by his PCP.   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  On review of system he reports bursitis in the left hip, some episode of food getting stuck in his chest, denies any Raynaud's phenomenon and no significant skin rashes.  ANA negative  Myomarker 12 panel negative.   PFTs with mild restriction and mild reduction in DLCO.   He continues to have persistent cough and nasal congestion with post nasal drip.   Family history -he denies any family history of pulmonary diseases.  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  He does not have any pets at home.  He does have 2 grown daughters who are healthy.    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 Prompted HRCT chest by his PCP that showed probable UIP. ANA negative Myo marker 12 panel negative. No significant systemic symptoms of autoimmune disease. PFT with mild restriction and mild reduction in DLCO on prior testing.  Started on pirfenidone  but had a severe photosensitive reaction which led to stopping.  Restarted on pirfenidone  last 3 months and appears to be tolerated well.  His LFTs in December and January were normal.  Currently on  801mg .  Furthermore we are treating him for reactive airway disease/asthma on Advair.  Respiratory status is stable.  He is able to do most activities without any limitations.  Will check LFTs again in February.  I will follow-up with him in 6 months.  ROS All systems were reviewed and are negative except for the above.  Objective:   There were no vitals filed for this visit.    on RA BMI Readings from Last 3 Encounters:  11/24/24 25.48 kg/m  06/28/24 25.24 kg/m  06/27/24 25.27 kg/m   Wt Readings from Last 3 Encounters:  11/24/24 167 lb 9.6 oz (76 kg)  06/28/24 166 lb (75.3 kg)  06/27/24 166 lb 3.2 oz (75.4 kg)    Physical Exam GEN: NAD, Healthy Appearing HEENT: Supple Neck, Reactive Pupils, EOMI  CVS: Normal S1, Normal S2, RRR, No murmurs or ES appreciated  Lungs: Velcro-like crackles heard at the bases Abdomen: Soft, non tender, non distended, + BS  Extremities: Warm and well perfused, No edema  Skin: No suspicious lesions appreciated  Psych: Normal Affect  Ancillary Information   CBC    Component Value Date/Time   WBC 6.7 05/10/2024 0754   RBC 5.02 05/10/2024 0754   HGB 15.1 05/10/2024 0754   HGB 13.9 12/31/2023 0734   HCT 45.3 05/10/2024 0754   HCT 41.4 12/31/2023 0734   PLT 240.0 05/10/2024 0754   PLT 228 12/31/2023 0734   MCV 90.3 05/10/2024 0754   MCV 92 12/31/2023 0734   MCH 30.8 12/31/2023 0734   MCH 30.1  04/22/2022 1700   MCHC 33.3 05/10/2024 0754   RDW 13.7 05/10/2024 0754   RDW 12.9 12/31/2023 0734   LYMPHSABS 1.8 05/10/2024 0754   LYMPHSABS 1.2 12/31/2023 0734   MONOABS 0.5 05/10/2024 0754   EOSABS 0.3 05/10/2024 0754   EOSABS 0.2 12/31/2023 0734   BASOSABS 0.0 05/10/2024 0754   BASOSABS 0.0 12/31/2023 0734   Labs and imaging were reviewed.     Latest Ref Rng & Units 11/24/2024    3:36 PM 06/27/2024    2:46 PM 02/25/2024    8:15 AM  PFT Results  FVC-Pre L 3.75  P 3.77  3.51   FVC-Predicted Pre % 89  P 89  83   FVC-Post L  3.71  3.61    FVC-Predicted Post %  88  85   Pre FEV1/FVC % % 79  P 79  78   Post FEV1/FCV % %  79  81   FEV1-Pre L 2.97  P 2.99  2.75   FEV1-Predicted Pre % 95  P 95  88   FEV1-Post L  2.94  2.92   DLCO uncorrected ml/min/mmHg 18.07  P 20.41  16.11   DLCO UNC% % 73  P 82  64   DLVA Predicted % 87  P 99  83   TLC L 5.82  P 5.50  5.16   TLC % Predicted % 88  P 83  78   RV % Predicted % 91  P -6  52     P Preliminary result     Assessment & Plan:  Mr. Burley is a pleasant 69 year old male patient with a past medical history of seasonal allergies on Zyrtec and hyperlipidemia on atorvastatin  presenting today to the pulmonary clinic for an ongoing cough and shortness of breath on activity.  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  PFT with mild restriction and mild to moderate reduction in DLCO.  Repeat PFTs yesterday with improved TLC and improved DLCO.  []  Will plan to repeat high-res CT and PFTs in 1 year. []  Continue with pirfenidone  [Started 10/2024]  #CRS and CVA  []  c/w Advair diskus 100 1 puff BID.  []  Flonase  1 spray each nostril daily  RTC 6 months.  I personally spent a total of 40 minutes in the care of the patient today including preparing to see the patient, getting/reviewing separately obtained history, performing a medically appropriate exam/evaluation, counseling and educating, placing orders, documenting clinical information in the EHR, independently interpreting results, and communicating results.   Darrin Barn, MD [facility] 11/25/2024 2:03 PM    "

## 2024-11-25 NOTE — Progress Notes (Signed)
 Received CC chart message from Dr. Malka -   Assaker, Darrin, MD  Veronia Aleck CROME, RPH-CPP Erskin Aleck,  Mr. Swoyer was asking if they can start receiving Pirfenidone  1 pill [801mg ] instead of the 3. Would you be able to help with that.  Thanks, JP   Rx for pirfenidone  801mg  tablet triaged to [facility].

## 2024-11-28 ENCOUNTER — Other Ambulatory Visit: Payer: Self-pay

## 2024-12-06 ENCOUNTER — Telehealth: Payer: Self-pay | Admitting: Family Medicine

## 2024-12-06 NOTE — Telephone Encounter (Signed)
 Patient requested to change from in person to telephone visit for the [facility] visit.

## 2024-12-09 ENCOUNTER — Other Ambulatory Visit: Payer: Self-pay

## 2025-03-07 ENCOUNTER — Ambulatory Visit: Admitting: Dermatology

## 2025-04-21 ENCOUNTER — Ambulatory Visit (INDEPENDENT_AMBULATORY_CARE_PROVIDER_SITE_OTHER): Admitting: Nurse Practitioner

## 2025-04-21 ENCOUNTER — Encounter (INDEPENDENT_AMBULATORY_CARE_PROVIDER_SITE_OTHER)

## 2025-05-15 ENCOUNTER — Ambulatory Visit: Admitting: Family Medicine

## 2025-06-29 ENCOUNTER — Ambulatory Visit

## 2025-06-30 ENCOUNTER — Ambulatory Visit
